# Patient Record
Sex: Female | Born: 1950
Health system: Southern US, Community
[De-identification: ages and names within clinical notes are randomized; demographics above are authoritative.]

## PROBLEM LIST (undated history)

## (undated) DIAGNOSIS — C801 Malignant (primary) neoplasm, unspecified: Secondary | ICD-10-CM

## (undated) DIAGNOSIS — M1990 Unspecified osteoarthritis, unspecified site: Secondary | ICD-10-CM

## (undated) DIAGNOSIS — K649 Unspecified hemorrhoids: Secondary | ICD-10-CM

## (undated) DIAGNOSIS — E079 Disorder of thyroid, unspecified: Secondary | ICD-10-CM

## (undated) DIAGNOSIS — K5790 Diverticulosis of intestine, part unspecified, without perforation or abscess without bleeding: Secondary | ICD-10-CM

## (undated) HISTORY — DX: Diverticulosis of intestine, part unspecified, without perforation or abscess without bleeding: K57.90

## (undated) HISTORY — PX: WRIST SURGERY: SHX841

## (undated) HISTORY — PX: OTHER SURGICAL HISTORY: SHX169

## (undated) HISTORY — DX: Unspecified hemorrhoids: K64.9

## (undated) HISTORY — DX: Disorder of thyroid, unspecified: E07.9

## (undated) HISTORY — DX: Unspecified osteoarthritis, unspecified site: M19.90

---

## 2001-05-28 ENCOUNTER — Other Ambulatory Visit: Admission: RE | Admit: 2001-05-28 | Discharge: 2001-05-28 | Payer: Self-pay | Admitting: Obstetrics and Gynecology

## 2001-07-11 ENCOUNTER — Ambulatory Visit (HOSPITAL_COMMUNITY): Admission: RE | Admit: 2001-07-11 | Discharge: 2001-07-11 | Payer: Self-pay | Admitting: Obstetrics and Gynecology

## 2001-07-11 ENCOUNTER — Encounter: Payer: Self-pay | Admitting: Obstetrics and Gynecology

## 2002-05-27 ENCOUNTER — Encounter: Payer: Self-pay | Admitting: Urology

## 2002-05-27 ENCOUNTER — Ambulatory Visit (HOSPITAL_COMMUNITY): Admission: RE | Admit: 2002-05-27 | Discharge: 2002-05-27 | Payer: Self-pay | Admitting: Urology

## 2002-07-22 ENCOUNTER — Encounter: Payer: Self-pay | Admitting: Obstetrics and Gynecology

## 2002-07-22 ENCOUNTER — Ambulatory Visit (HOSPITAL_COMMUNITY): Admission: RE | Admit: 2002-07-22 | Discharge: 2002-07-22 | Payer: Self-pay | Admitting: Obstetrics and Gynecology

## 2002-08-15 ENCOUNTER — Ambulatory Visit (HOSPITAL_COMMUNITY): Admission: RE | Admit: 2002-08-15 | Discharge: 2002-08-15 | Payer: Self-pay | Admitting: Family Medicine

## 2002-08-15 ENCOUNTER — Encounter: Payer: Self-pay | Admitting: Family Medicine

## 2002-11-17 ENCOUNTER — Ambulatory Visit (HOSPITAL_COMMUNITY): Admission: RE | Admit: 2002-11-17 | Discharge: 2002-11-17 | Payer: Self-pay | Admitting: Internal Medicine

## 2003-09-14 ENCOUNTER — Ambulatory Visit (HOSPITAL_COMMUNITY): Admission: RE | Admit: 2003-09-14 | Discharge: 2003-09-14 | Payer: Self-pay | Admitting: Obstetrics & Gynecology

## 2003-09-14 ENCOUNTER — Encounter: Payer: Self-pay | Admitting: Obstetrics & Gynecology

## 2004-09-27 ENCOUNTER — Ambulatory Visit (HOSPITAL_COMMUNITY): Admission: RE | Admit: 2004-09-27 | Discharge: 2004-09-27 | Payer: Self-pay | Admitting: Obstetrics and Gynecology

## 2005-05-12 ENCOUNTER — Emergency Department (HOSPITAL_COMMUNITY): Admission: EM | Admit: 2005-05-12 | Discharge: 2005-05-12 | Payer: Self-pay | Admitting: Family Medicine

## 2005-06-08 ENCOUNTER — Ambulatory Visit (HOSPITAL_COMMUNITY): Admission: RE | Admit: 2005-06-08 | Discharge: 2005-06-08 | Payer: Self-pay | Admitting: Family Medicine

## 2005-10-09 ENCOUNTER — Ambulatory Visit (HOSPITAL_COMMUNITY): Admission: RE | Admit: 2005-10-09 | Discharge: 2005-10-09 | Payer: Self-pay | Admitting: Obstetrics and Gynecology

## 2006-04-19 ENCOUNTER — Ambulatory Visit: Payer: Self-pay | Admitting: Internal Medicine

## 2006-04-23 ENCOUNTER — Ambulatory Visit (HOSPITAL_COMMUNITY): Admission: RE | Admit: 2006-04-23 | Discharge: 2006-04-23 | Payer: Self-pay | Admitting: Internal Medicine

## 2006-10-11 ENCOUNTER — Ambulatory Visit (HOSPITAL_COMMUNITY): Admission: RE | Admit: 2006-10-11 | Discharge: 2006-10-11 | Payer: Self-pay | Admitting: Obstetrics and Gynecology

## 2007-01-02 ENCOUNTER — Ambulatory Visit (HOSPITAL_COMMUNITY): Admission: RE | Admit: 2007-01-02 | Discharge: 2007-01-02 | Payer: Self-pay | Admitting: Family Medicine

## 2007-02-21 ENCOUNTER — Ambulatory Visit: Payer: Self-pay | Admitting: Orthopedic Surgery

## 2007-04-09 ENCOUNTER — Encounter (HOSPITAL_COMMUNITY): Admission: RE | Admit: 2007-04-09 | Discharge: 2007-05-09 | Payer: Self-pay | Admitting: Orthopedic Surgery

## 2007-04-10 ENCOUNTER — Ambulatory Visit: Payer: Self-pay | Admitting: Orthopedic Surgery

## 2007-10-14 ENCOUNTER — Ambulatory Visit (HOSPITAL_COMMUNITY): Admission: RE | Admit: 2007-10-14 | Discharge: 2007-10-14 | Payer: Self-pay | Admitting: Obstetrics and Gynecology

## 2007-10-16 ENCOUNTER — Other Ambulatory Visit: Admission: RE | Admit: 2007-10-16 | Discharge: 2007-10-16 | Payer: Self-pay | Admitting: Obstetrics and Gynecology

## 2008-05-13 ENCOUNTER — Ambulatory Visit (HOSPITAL_COMMUNITY): Admission: RE | Admit: 2008-05-13 | Discharge: 2008-05-13 | Payer: Self-pay | Admitting: Family Medicine

## 2008-11-03 ENCOUNTER — Ambulatory Visit (HOSPITAL_COMMUNITY): Admission: RE | Admit: 2008-11-03 | Discharge: 2008-11-03 | Payer: Self-pay | Admitting: Obstetrics and Gynecology

## 2008-11-04 ENCOUNTER — Other Ambulatory Visit: Admission: RE | Admit: 2008-11-04 | Discharge: 2008-11-04 | Payer: Self-pay | Admitting: Obstetrics and Gynecology

## 2009-07-13 ENCOUNTER — Ambulatory Visit: Payer: Self-pay | Admitting: Gastroenterology

## 2009-07-13 DIAGNOSIS — M479 Spondylosis, unspecified: Secondary | ICD-10-CM | POA: Insufficient documentation

## 2009-07-13 DIAGNOSIS — K921 Melena: Secondary | ICD-10-CM | POA: Insufficient documentation

## 2009-07-14 ENCOUNTER — Encounter: Payer: Self-pay | Admitting: Gastroenterology

## 2009-07-26 ENCOUNTER — Ambulatory Visit (HOSPITAL_COMMUNITY): Admission: RE | Admit: 2009-07-26 | Discharge: 2009-07-26 | Payer: Self-pay | Admitting: Gastroenterology

## 2009-07-26 ENCOUNTER — Ambulatory Visit: Payer: Self-pay | Admitting: Gastroenterology

## 2009-11-05 ENCOUNTER — Ambulatory Visit (HOSPITAL_COMMUNITY): Admission: RE | Admit: 2009-11-05 | Discharge: 2009-11-05 | Payer: Self-pay | Admitting: Obstetrics and Gynecology

## 2009-11-12 ENCOUNTER — Other Ambulatory Visit: Admission: RE | Admit: 2009-11-12 | Discharge: 2009-11-12 | Payer: Self-pay | Admitting: Obstetrics and Gynecology

## 2010-01-21 ENCOUNTER — Ambulatory Visit (HOSPITAL_COMMUNITY): Admission: RE | Admit: 2010-01-21 | Discharge: 2010-01-21 | Payer: Self-pay | Admitting: Family Medicine

## 2010-05-30 ENCOUNTER — Ambulatory Visit (HOSPITAL_COMMUNITY): Admission: RE | Admit: 2010-05-30 | Discharge: 2010-05-30 | Payer: Self-pay | Admitting: Family Medicine

## 2010-09-19 ENCOUNTER — Ambulatory Visit (HOSPITAL_COMMUNITY): Admission: RE | Admit: 2010-09-19 | Discharge: 2010-09-19 | Payer: Self-pay | Admitting: Family Medicine

## 2011-01-14 ENCOUNTER — Encounter: Payer: Self-pay | Admitting: Obstetrics and Gynecology

## 2011-01-19 ENCOUNTER — Inpatient Hospital Stay (HOSPITAL_COMMUNITY): Admit: 2011-01-19 | Payer: Self-pay

## 2011-01-19 ENCOUNTER — Other Ambulatory Visit (HOSPITAL_COMMUNITY)
Admission: RE | Admit: 2011-01-19 | Discharge: 2011-01-19 | Disposition: A | Discharge status: Home / Self Care | Payer: 59 | Source: Ambulatory Visit | Attending: Obstetrics and Gynecology | Admitting: Obstetrics and Gynecology

## 2011-01-19 ENCOUNTER — Other Ambulatory Visit: Payer: Self-pay | Admitting: Adult Health

## 2011-01-19 DIAGNOSIS — Z01419 Encounter for gynecological examination (general) (routine) without abnormal findings: Secondary | ICD-10-CM | POA: Insufficient documentation

## 2011-01-26 ENCOUNTER — Other Ambulatory Visit: Payer: Self-pay | Admitting: Obstetrics and Gynecology

## 2011-01-26 DIAGNOSIS — Z139 Encounter for screening, unspecified: Secondary | ICD-10-CM

## 2011-01-30 ENCOUNTER — Ambulatory Visit (HOSPITAL_COMMUNITY)
Admission: RE | Admit: 2011-01-30 | Discharge: 2011-01-30 | Disposition: A | Discharge status: Home / Self Care | Payer: 59 | Source: Ambulatory Visit | Attending: Obstetrics and Gynecology | Admitting: Obstetrics and Gynecology

## 2011-01-30 DIAGNOSIS — Z1231 Encounter for screening mammogram for malignant neoplasm of breast: Secondary | ICD-10-CM | POA: Insufficient documentation

## 2011-01-30 DIAGNOSIS — Z139 Encounter for screening, unspecified: Secondary | ICD-10-CM

## 2011-02-09 ENCOUNTER — Other Ambulatory Visit: Payer: Self-pay | Admitting: Adult Health

## 2011-03-05 ENCOUNTER — Emergency Department (HOSPITAL_COMMUNITY)
Admission: EM | Admit: 2011-03-05 | Discharge: 2011-03-05 | Disposition: A | Discharge status: Home / Self Care | Payer: 59 | Attending: Emergency Medicine | Admitting: Emergency Medicine

## 2011-03-05 ENCOUNTER — Emergency Department (HOSPITAL_COMMUNITY): Payer: 59

## 2011-03-05 DIAGNOSIS — W208XXA Other cause of strike by thrown, projected or falling object, initial encounter: Secondary | ICD-10-CM | POA: Insufficient documentation

## 2011-03-05 DIAGNOSIS — S5290XA Unspecified fracture of unspecified forearm, initial encounter for closed fracture: Secondary | ICD-10-CM | POA: Insufficient documentation

## 2011-03-06 ENCOUNTER — Encounter: Payer: Self-pay | Admitting: Orthopedic Surgery

## 2011-03-06 ENCOUNTER — Ambulatory Visit (INDEPENDENT_AMBULATORY_CARE_PROVIDER_SITE_OTHER): Payer: 59 | Admitting: Orthopedic Surgery

## 2011-03-06 DIAGNOSIS — S52599A Other fractures of lower end of unspecified radius, initial encounter for closed fracture: Secondary | ICD-10-CM

## 2011-03-07 ENCOUNTER — Encounter: Payer: Self-pay | Admitting: Orthopedic Surgery

## 2011-03-14 NOTE — Letter (Signed)
Summary: Out of Work  [facility]  [address]  [place], [place] [address]   Phone: [phone]  Fax: [phone]    March 06, 2011   Employee:  [name]    To Whom It May Concern:   For Medical reasons, please excuse the above named employee from work for the following dates:  Start:   03/06/11  End      05/05/11   Out of work X 8 weeks   Estimated return to work date:   05/08/11   If you need additional information, please feel free to contact our office.         Sincerely,    Terrance Mass, MD

## 2011-03-14 NOTE — Letter (Signed)
Summary: work note faxed to employer  work note faxed to employer   Imported By: Cammie Sickle 03/08/2011 13:40:50  _____________________________________________________________________  External Attachment:    Type:   Image     Comment:   External Document

## 2011-03-14 NOTE — Assessment & Plan Note (Signed)
Summary: [facility] fx left wrist xr there/uhc/bsf                         ...   Vital Signs:  Patient profile:   60 year old female Height:      62 inches Weight:      133 pounds BMI:     24.41 Pulse rate:   78 / minute Resp:     16 per minute  Vitals Entered By: Fuller Canada MD (March 06, 2011 2:54 PM)  Visit Type:  new patient Referring Provider:  [facility] Primary Provider:  Phillips Odor  CC:  left wrist.  History of Present Illness: I saw Theresa Vincent in the office today for an initial visit.  She is a 60 years old woman with the complaint of:  left wrist fracture., and pain  DOI 03/05/11 was unloading a grill and the grill fell onto her arm.  Xrays [facility] 03/05/11 left wrist and forearm.  Meds: Vicodin 5/500mg  from er given number 20, Vitamins, Chromium, Calcium.    Her complaints are as follows.-type pain throbbing, intensity-7/10, timing constant.  Improved with no measures.  Worst with trying to use her arm.  Associated signs and symptoms tingling and swelling  Allergies: 1)  ! Avelox  Past History:  Past Surgical History: HAD HER BLADDER STRETCHED BY DR JAVAID IN THE PAST broken toe, screw in toe  Family History: No FH of Colon Cancer, chronic gi illnesses, liver dz FH of Cancer:  Family History of Diabetes Family History Coronary Heart Disease female < 69 Hx, family, kidney disease NEC  Social History: Patient currently smokes.  1/2 ppd for over 30 years Alcohol Use - rare Seperated Employed at [employer]. 4 cups of caffeine per day high school education  Review of Systems Constitutional:  Denies weight loss, weight gain, fever, chills, and fatigue. Cardiovascular:  Denies chest pain, palpitations, fainting, and murmurs. Respiratory:  Denies short of breath, wheezing, couch, tightness, pain on inspiration, and snoring . Gastrointestinal:  Denies heartburn, nausea, vomiting, diarrhea, constipation, and blood in your stools. Genitourinary:  Denies  frequency, urgency, difficulty urinating, painful urination, flank pain, and bleeding in urine. Neurologic:  Denies numbness, tingling, unsteady gait, dizziness, tremors, and seizure. Musculoskeletal:  Denies joint pain, swelling, instability, stiffness, redness, heat, and muscle pain. Endocrine:  Denies excessive thirst, exessive urination, and heat or cold intolerance. Psychiatric:  Denies nervousness, depression, anxiety, and hallucinations. Skin:  Denies changes in the skin, poor healing, rash, itching, and redness. HEENT:  Denies blurred or double vision, eye pain, redness, and watering. Immunology:  Denies seasonal allergies, sinus problems, and allergic to bee stings. Hemoatologic:  Denies easy bleeding and brusing.  Physical Exam  Skin:  intact without lesions or rashes Cervical Nodes:  no significant adenopathy Psych:  alert and cooperative; normal mood and affect; normal attention span and concentration   Wrist/Hand Exam  General:    Well-developed, well-nourished, in no acute distress; alert and oriented x 3.  small body frame  Vascular:    Radial, ulnar, brachial, and axillary pulses 2+ and symmetric; capillary refill less than 2 seconds; no evidence of ischemia, clubbing, or cyanosis.    Sensory:    Gross sensation intact in the upper extremities.    Motor:    motor exam is deferred she can move her fingers move her hand with her wrist but is painful.  She has normal muscle tone in the upper extremity.  Reflexes:  reflex exam is deferred secondary to pain and fracture  Wrist Exam:    Left:    Inspection:  Abnormal    Palpation:  Abnormal    Stability:  stable    Tenderness:  distal radius    Swelling:  distal radius    Erythema:  no    painful arc of motion of 40  Hand Exam:    Right:    Inspection:  Normal    Palpation:  Normal    Tenderness:  no    Swelling:  no    Erythema:  no    Left:    Inspection:  Normal    Palpation:  Normal     Tenderness:  no    Swelling:  no    Erythema:  no   Impression & Recommendations:  Problem # 1:  FRACTURE, RADIUS, DISTAL (ICD-813.42)  x-rays from the emergency room  Radiographs show a nondisplaced slightly angulated fracture the distal radius with intra-articular extension.  Fracture criteria indicate nonoperative treatment  Patient aware that radiographs will have to be done in 2 weeks to assure that the fracture is stable.  Placed in short arm cast  Orders: New Patient Level III (91478) Distal Radius Fx (25600)  Medications Added to Medication List This Visit: 1)  Ibuprofen 800 Mg Tabs (Ibuprofen) .Marland Kitchen.. 1 by mouth three times a day  Patient Instructions: 1)  Please do not get the cast wet. It will casue a severe skin reaction. If you do get it wet, dry it with a hairdryer on a low setting and call the office. [the cast will need to be changed] 2)  OOW 8 weeks  3)  xrays in 2 weeks on the 26th in cast 4)  you have a fractured wrist: use ice and elevate the wrist x next 3-5 days and any time it looks or feels swollen  Prescriptions: IBUPROFEN 800 MG TABS (IBUPROFEN) 1 by mouth three times a day  #90 x 2   Entered and Authorized by:   Fuller Canada MD   Signed by:   Fuller Canada MD on 03/06/2011   Method used:   Faxed to ...       [facility]  [address] [phone]* (retail)       [address]       [place], [place]  [address]       Ph: [phone]       Fax: [phone]   RxID:   [id number]    Orders Added: 1)  New Patient Level III [25956] 2)  Distal Radius Fx [25600]

## 2011-03-15 ENCOUNTER — Encounter: Payer: Self-pay | Admitting: Orthopedic Surgery

## 2011-03-20 ENCOUNTER — Ambulatory Visit: Payer: 59 | Admitting: Orthopedic Surgery

## 2011-03-21 ENCOUNTER — Ambulatory Visit (INDEPENDENT_AMBULATORY_CARE_PROVIDER_SITE_OTHER): Payer: 59 | Admitting: Orthopedic Surgery

## 2011-03-21 DIAGNOSIS — S52599A Other fractures of lower end of unspecified radius, initial encounter for closed fracture: Secondary | ICD-10-CM

## 2011-03-21 NOTE — Discharge Summary (Signed)
Separate x-ray report.  AP, lateral, and oblique x-rays of the LEFT distal radius in cast.  Cast Hides some of the bony detail.  A 2nd oblique x-ray was taken to get a better look at the distal radius.  The radial inclination angle is acceptable. There is minimal shortening. There is neutral volar tilt. The alignment overall is unchanged from x-ray taken on March 11 at the hospital.

## 2011-03-21 NOTE — Progress Notes (Signed)
Followup visit. Date of injury 03/05/2011  Diagnosis distal radius fracture, LEFT.  Treatment short-arm cast.  This is a 2 week 5 days. X-ray in cast.  No major complaints. Minimal over-the-counter medication needed.  X-ray shows stable alignment of the fracture with slight shortening on the ulnar side. Good radial inclination, and neutral volar tilt.  X-ray out of plaster in 2 weeks and then recast.

## 2011-03-23 NOTE — Letter (Signed)
Summary: History form  History form   Imported By: Cammie Sickle 03/13/2011 20:19:33  _____________________________________________________________________  External Attachment:    Type:   Image     Comment:   External Document

## 2011-04-04 ENCOUNTER — Encounter: Payer: Self-pay | Admitting: Orthopedic Surgery

## 2011-04-04 ENCOUNTER — Ambulatory Visit (INDEPENDENT_AMBULATORY_CARE_PROVIDER_SITE_OTHER): Payer: 59 | Admitting: Orthopedic Surgery

## 2011-04-04 DIAGNOSIS — S52599A Other fractures of lower end of unspecified radius, initial encounter for closed fracture: Secondary | ICD-10-CM

## 2011-04-04 NOTE — Patient Instructions (Signed)
Wear splint 4 weeks

## 2011-04-04 NOTE — Discharge Summary (Signed)
Separate identifiable. X-ray report AP, lateral, and oblique of the LEFT wrist.  Reason for x-ray. X-ray followup of her fracture, distal radius.  Previous films are included for comparison.  There is a fracture of the distal radius, which shows progression toward healing. There is slight loss of radial inclination, and dorsal tilt is noted as well.  It is not thought to be excessive.  Impression healing distal radius fracture with slight loss of radial inclination, length and loss of some of the volar tilt.

## 2011-04-04 NOTE — Progress Notes (Signed)
4 weeks and 2 days s/p wrist fracture   LEFT wrist will be x-rayed today.  Clinical exam shows tenderness over the wrist joint, but the metacarpophalangeal joint shows good flexion. Fracture site still tender.  X-rays show slight loss of length and radial inclination overall alignment acceptable.  Splint for 4 weeks rex-ray.

## 2011-05-02 ENCOUNTER — Other Ambulatory Visit: Payer: Self-pay | Admitting: Orthopedic Surgery

## 2011-05-02 ENCOUNTER — Ambulatory Visit (INDEPENDENT_AMBULATORY_CARE_PROVIDER_SITE_OTHER): Payer: 59 | Admitting: Orthopedic Surgery

## 2011-05-02 ENCOUNTER — Encounter: Payer: Self-pay | Admitting: Orthopedic Surgery

## 2011-05-02 DIAGNOSIS — S62109A Fracture of unspecified carpal bone, unspecified wrist, initial encounter for closed fracture: Secondary | ICD-10-CM

## 2011-05-02 NOTE — Progress Notes (Signed)
8 weeks after fracture, distal radius initially, treated with cast and then followed with brace.  Still has some limitations in pronation, supination, especially supination. No tenderness no swelling of the wrist. No tenderness at the fracture site x-ray taken today shows fracture healing. Slight dorsal tilt, slight shortening.  Patient advised to have osteoporosis bone density test already on calcium with D.  I will call her with the results of her bone density

## 2011-05-02 NOTE — Discharge Summary (Signed)
Separate x-ray report AP, lateral, oblique, LEFT distal radius fracture. Previous films are noted for comparison.  The distal radius fracture is healed. The length is mainly maintained with about 2 mm of shortening. Slight dorsal tilt.  Overall alignment acceptable.  Impression healed distal radius fracture with acceptable alignment

## 2011-05-04 ENCOUNTER — Ambulatory Visit (HOSPITAL_COMMUNITY)
Admission: RE | Admit: 2011-05-04 | Discharge: 2011-05-04 | Disposition: A | Discharge status: Home / Self Care | Payer: 59 | Source: Ambulatory Visit | Attending: Orthopedic Surgery | Admitting: Orthopedic Surgery

## 2011-05-04 DIAGNOSIS — M818 Other osteoporosis without current pathological fracture: Secondary | ICD-10-CM | POA: Insufficient documentation

## 2011-05-04 DIAGNOSIS — S62109A Fracture of unspecified carpal bone, unspecified wrist, initial encounter for closed fracture: Secondary | ICD-10-CM

## 2011-05-09 NOTE — Op Note (Signed)
Theresa Vincent, Theresa Vincent                ACCOUNT NO.:  [id number]   MEDICAL RECORD NO.:  [id number]          PATIENT TYPE:  AMB   LOCATION:  DAY                           FACILITY:  [facility]   PHYSICIAN:  Kassie Mends, M.D.      DATE OF BIRTH:  [birth year]   DATE OF PROCEDURE:  07/26/2009  DATE OF DISCHARGE:                                PROCEDURE NOTE   REFERRING PHYSICIAN:  Corrie Mckusick, MD   PROCEDURE:  Colonoscopy.   INDICATION FOR EXAM:  Theresa Vincent is a 60 year old female whose last  colonoscopy is 2003.  Colonoscopy revealed a few sigmoid colon  diverticula and hemorrhoids.  She also has intermittent left-sided  abdominal pain, which hyoscyamine improves and rectal bleeding.  She  denies any change in her bowel habits associated with the abdominal  pain.   FINDINGS:  1. Unable to appreciate any sigmoid colon diverticula.  No polyps,      masses, inflammatory changes or AVMs seen.  2. Single cecal diverticulum.  3. Small internal hemorrhoids.  Otherwise normal retroflexed view of      the rectum.   DIAGNOSIS:  Rectal bleeding likely 2o to hemorrhoids   RECOMMENDATIONS:  1. The patient may use Anusol-HC suppositories 1 per rectum twice      daily for 10 days for intermittent rectal bleeding or rectal      discomfort.  2. Screening colonoscopy in 10 years.  3. No aspirin for 7 days.  No anticoagulation for 7 days.  4. She should follow a high-fiber diet.  She is given a handout on      high-fiber diet and hemorrhoids.   MEDICATIONS:  1. Demerol 75 mg IV.  2. Versed 4 mg IV.   PROCEDURE TECHNIQUE:  Physical exam was performed.  Informed consent was  obtained from the patient after explaining the benefits, risks and  alternatives to the procedure.  The patient was connected to the monitor  and placed in left lateral position.  Continuous oxygen was provided by  nasal cannula and IV medicine administered through an indwelling  cannula.  After administration of sedation and  rectal exam, the  patient's rectum was intubated and the  scope was advanced under direct visualization to the cecum.  The scope  was removed slowly by carefully examining the color, texture, anatomy  and integrity of the mucosa on the way out.  The patient was recovered  in endoscopy and discharged home in satisfactory condition.      Kassie Mends, M.D.  Electronically Signed     SM/MEDQ  D:  07/26/2009  T:  07/27/2009  Job:  [id number]   cc:   Corrie Mckusick, M.D.  Fax: [phone]

## 2011-05-12 NOTE — Op Note (Signed)
NAME:  [name], [name]                          ACCOUNT NO.:  [id number]   MEDICAL RECORD NO.:  [id number]                   PATIENT TYPE:  AMB   LOCATION:  DAY                                  FACILITY:  [facility]   PHYSICIAN:  Lionel December, M.D.                 DATE OF BIRTH:  [birth year]   DATE OF PROCEDURE:  11/17/2002  DATE OF DISCHARGE:                                 OPERATIVE REPORT   PROCEDURE:  Total colonoscopy.   INDICATIONS FOR PROCEDURE:  Theresa Vincent is a 60 year old Caucasian female who was  recently treated for diverticulitis but she still has some intermittent pain  in the LLQ. She is undergoing colonoscopy both for diagnostic and screening  purposes. The procedure risks were reviewed with the patient and informed  consent was obtained.   PREOP MEDICATIONS:  Demerol 25 mg IV, Versed 4 mg IV in divided dose.  Atropine 0.3 mg IV.   INSTRUMENT:  Olympus video system.   FINDINGS:  Procedure performed in endoscopy suite. The patient's vital signs  and O2 saturations were monitored during the procedure. She was given a  small dose of Atropine as above for asymptomatic bradycardia. The patient  was placed in the left lateral decubitus position, rectal examination  performed. No abnormalities noted on external or digital exam. The scope was  placed in the rectum and advanced under direct vision in the sigmoid colon  and beyond. Preparation excellent. The scope was passed to the cecum which  was identified by the ileocecal valve and appendiceal orifice. Pictures  taken for the record and part of her database. As the scope was withdrawn,  the colonic mucosa was carefully examined. There were a few tiny diverticula  at the sigmoid colon. There were also focal areas of erythema at the distal  sigmoid colon felt to be nonspecific. There were no erosions or ulceration.  The rectal mucosa was normal. The scope was retroflexed to examine the  anorectal junction and small hemorrhoids were  noted below the dentate line  as well as single papilla. The endoscope was straightened and withdrawn. The  patient tolerated the procedure well.   FINAL DIAGNOSIS:  1. Examination performed to the cecum.  2. A few tiny diverticula in the sigmoid colon and small external     hemorrhoids, otherwise, normal exam.    RECOMMENDATIONS:  High fiber diet, Citrucel or equivalent 1 tablespoonful  daily, and Levbid 1/2 tablet every morning. She will be seen in three months  to make sure her GI symptoms have completely resolved.                                                Lionel December, M.D.    NR/MEDQ  D:  11/17/2002  T:  11/17/2002  Job:  [id number]   cc:   Lazaro Arms, M.D.  [address]  [place]  [place] [address]  Fax: [phone]   Corrie Mckusick, M.D.  [address]  [place]  [place] [address]  Fax: [phone]

## 2011-05-12 NOTE — Consult Note (Signed)
Theresa Vincent, Theresa Vincent                            ACCOUNT NO.:  [id number]   MEDICAL RECORD NO.:  [id number]                  PATIENT TYPE:   LOCATION:                                       FACILITY:   PHYSICIAN:  Lionel December, M.D.                 DATE OF BIRTH:  13-Apr-1951   DATE OF CONSULTATION:  10/21/2002  DATE OF DISCHARGE:                                   CONSULTATION   REASON FOR CONSULTATION:  Recurrent diverticulitis, colonoscopy.   HISTORY OF PRESENT ILLNESS:  Theresa Vincent is a 60 year old Caucasian female who  presents today for further evaluation of left lower quadrant abdominal pain  as requested by Dr. Despina Hidden.  She developed pain about 4 weeks ago in her left  lower quadrant region.  The pain gradually worsened up until last week.  She  has had multiple studies. She had an MRI of the left hip which showed very  mild bursitis adjacent to the greater trochanter on the proximal left femur.  There was a tiny area of grade 4 chondromalacia of the anterior/superior  lateral aspect of the left femoral head.  No evidence of avascular necrosis  or significant degenerative change.  She also had a renal ultrasound as  requested by Dr. Jerre Simon.  There were 2 nonobstructing renal calculi in the  right kidney and a possible one in the left.  She tells me that he did a  cystoscopy and stretched her bladder.  She saw Dr. Despina Hidden last week given a  history of a left ovarian cyst.  Pelvic examination was normal.  She was  Hemoccult negative on rectal examination.  She was started on Cipro 500 mg  b.i.d. and Flagyl 500 mg b.i.d. for 14 days.  The patient presents today  stating that her left lower quadrant abdominal pain is slowly resolving.  It  is much less intense than last week.  She has completed about 5 days of  antibiotic therapy.  She denies any history of change in her bowels with  this pain.  She typically has a bowel movement daily.  Since starting  antibiotics she has had a couple of  extra stools daily.  She denies any  melena or rectal bleeding.  She has nausea associated with this abdominal  pain.  No heartburn.  No weight loss.  She has some dysuria, but urinalysis  was normal.   CURRENT MEDICATIONS:  1. _______________ q.d.  2. Cipro 500 mg b.i.d. for 14 days.  3. Flagyl 500 mg b.i.d. for 14 days.  4. Multivitamin every day.  5. Calcium 600 mg every day.  6. Glucosamine 1500 mg every day.   ALLERGIES:  No known drug allergies.   PAST MEDICAL HISTORY:  She has arthritis in her back.  Recent MRI of the  left hip revealed mild bursitis. She is on hormone replacement therapy.  Dr.  Frann Rider recently performed a  cystoscopy and stretched her bladder.   FAMILY HISTORY:  Father has had a history of peptic ulcer disease.  No  family history of colorectal cancer.   SOCIAL HISTORY:  She is married for 21 years.  Has 1 child.  She is employed  with [employer].  She has been smoking for 25 years.  She currently  smokes 1 pack per day.  Occasionally consumes beer.   REVIEW OF SYSTEMS:  Please see HPI for GI.  GENERAL: Denies any weight loss,  fatigue, weakness.  CARDIOPULMONARY: Denies any chest pain or shortness of  breath.   PHYSICAL EXAMINATION:  VITAL SIGNS:  Weight 129.5, height 5 feet 2 inches.  Temperature 97.4, blood pressure 100/60, pulse 70.  GENERAL:  A very pleasant, well-nourished, well-developed Caucasian female  in no acute distress.  SKIN:  Warm and dry, no jaundice.  HEENT:  Pupils equal round, reacted to light.  Conjunctivae are pink.  Sclerae are nonicteric.  Oropharyngeal mucosa moist and pink, no lesions,  erythema, or exudate.  No lymphadenopathy, thyromegaly or carotid bruits.  CHEST:  Lungs are clear to auscultation.  CARDIAC:  Cardiac exam reveals regular rate and rhythm.  Normal S1, S2, no  murmurs, rubs, or gallops.  ABDOMEN:  Positive bowel sounds, soft nondistended.  She has mild tenderness  in the left lower quadrant region to  deep palpation.  No organomegaly or  masses.  RECTAL:  Examination deferred.  This was done by Dr. Despina Hidden and was Hemoccult  negative.  EXTREMITIES:  No edema.   IMPRESSION:  This is a pleasant, 60 year old lady who has a 4-week history  of left lower quadrant abdominal pain.  I suspect that she has had  diverticulitis. The pain is now slowly resolving on Cipro and Flagyl.  I  doubt findings of the MRI of the hip explain this left lower quadrant  abdominal pain.  She has never had her colon imaged. She will be 51 this  month and I do recommend a screening colonoscopy.   PLAN:  1. She will continue Cipro and Flagyl, complete 14-day course.  2. If her symptoms do not completely resolve or worsen, she will let me     know.  3. Will plan on colonoscopy in about 3-4 weeks from now.   I would like to thank Dr. Despina Hidden for allowing [place] to take part in the care of  this patient.     Tana Coast, P.A.                        Lionel December, M.D.    LL/MEDQ  D:  10/21/2002  T:  10/22/2002  Job:  [id number]

## 2011-05-16 ENCOUNTER — Encounter: Payer: Self-pay | Admitting: Orthopedic Surgery

## 2011-05-17 ENCOUNTER — Telehealth: Payer: Self-pay | Admitting: Orthopedic Surgery

## 2011-05-17 NOTE — Telephone Encounter (Signed)
Patient her results of her DEXA scan which shows she has osteoporosis with the T score of 2.8  Recommended calcium with vitamin D   Caltrate D  2 tablets a day  Discuss bone density medication with her physician

## 2012-01-22 ENCOUNTER — Other Ambulatory Visit: Payer: Self-pay | Admitting: Obstetrics and Gynecology

## 2012-01-22 DIAGNOSIS — Z139 Encounter for screening, unspecified: Secondary | ICD-10-CM

## 2012-01-30 ENCOUNTER — Other Ambulatory Visit (HOSPITAL_COMMUNITY)
Admission: RE | Admit: 2012-01-30 | Discharge: 2012-01-30 | Disposition: A | Discharge status: Home / Self Care | Payer: 59 | Source: Ambulatory Visit | Attending: Obstetrics and Gynecology | Admitting: Obstetrics and Gynecology

## 2012-01-30 ENCOUNTER — Other Ambulatory Visit: Payer: Self-pay | Admitting: Adult Health

## 2012-01-30 DIAGNOSIS — Z01419 Encounter for gynecological examination (general) (routine) without abnormal findings: Secondary | ICD-10-CM | POA: Insufficient documentation

## 2012-02-08 ENCOUNTER — Ambulatory Visit (HOSPITAL_COMMUNITY)
Admission: RE | Admit: 2012-02-08 | Discharge: 2012-02-08 | Disposition: A | Discharge status: Home / Self Care | Payer: 59 | Source: Ambulatory Visit | Attending: Obstetrics and Gynecology | Admitting: Obstetrics and Gynecology

## 2012-02-08 DIAGNOSIS — Z139 Encounter for screening, unspecified: Secondary | ICD-10-CM

## 2012-02-08 DIAGNOSIS — Z1231 Encounter for screening mammogram for malignant neoplasm of breast: Secondary | ICD-10-CM | POA: Insufficient documentation

## 2012-02-14 ENCOUNTER — Telehealth: Payer: Self-pay

## 2012-02-14 NOTE — Telephone Encounter (Signed)
Pt's last colonoscopy was by Dr. Darrick Penna on 07/26/2009 and was recommended next screening in 10 years. Will check and see if pt is having any problems. LMOM for a return call.

## 2012-02-15 NOTE — Telephone Encounter (Signed)
LMOM to call. Mailing a letter to call also.  

## 2012-02-15 NOTE — Telephone Encounter (Signed)
Copy of letter faxed to Dr. Sherwood Gambler, also.

## 2012-03-06 ENCOUNTER — Ambulatory Visit: Payer: 59 | Admitting: Orthopedic Surgery

## 2012-12-04 ENCOUNTER — Encounter (HOSPITAL_COMMUNITY): Payer: Self-pay | Admitting: Emergency Medicine

## 2012-12-04 ENCOUNTER — Emergency Department (HOSPITAL_COMMUNITY)
Admission: EM | Admit: 2012-12-04 | Discharge: 2012-12-04 | Disposition: A | Discharge status: Home / Self Care | Payer: 59 | Attending: Emergency Medicine | Admitting: Emergency Medicine

## 2012-12-04 ENCOUNTER — Emergency Department (HOSPITAL_COMMUNITY): Payer: 59

## 2012-12-04 DIAGNOSIS — F17200 Nicotine dependence, unspecified, uncomplicated: Secondary | ICD-10-CM | POA: Insufficient documentation

## 2012-12-04 DIAGNOSIS — Y9389 Activity, other specified: Secondary | ICD-10-CM | POA: Insufficient documentation

## 2012-12-04 DIAGNOSIS — S96919A Strain of unspecified muscle and tendon at ankle and foot level, unspecified foot, initial encounter: Secondary | ICD-10-CM

## 2012-12-04 DIAGNOSIS — Z8719 Personal history of other diseases of the digestive system: Secondary | ICD-10-CM | POA: Insufficient documentation

## 2012-12-04 DIAGNOSIS — W010XXA Fall on same level from slipping, tripping and stumbling without subsequent striking against object, initial encounter: Secondary | ICD-10-CM | POA: Insufficient documentation

## 2012-12-04 DIAGNOSIS — Y929 Unspecified place or not applicable: Secondary | ICD-10-CM | POA: Insufficient documentation

## 2012-12-04 DIAGNOSIS — Z8739 Personal history of other diseases of the musculoskeletal system and connective tissue: Secondary | ICD-10-CM | POA: Insufficient documentation

## 2012-12-04 DIAGNOSIS — Z79899 Other long term (current) drug therapy: Secondary | ICD-10-CM | POA: Insufficient documentation

## 2012-12-04 DIAGNOSIS — S93409A Sprain of unspecified ligament of unspecified ankle, initial encounter: Secondary | ICD-10-CM | POA: Insufficient documentation

## 2012-12-04 DIAGNOSIS — Z791 Long term (current) use of non-steroidal anti-inflammatories (NSAID): Secondary | ICD-10-CM | POA: Insufficient documentation

## 2012-12-04 MED ORDER — HYDROCODONE-ACETAMINOPHEN 5-325 MG PO TABS: 1.0000 | ORAL_TABLET | ORAL | Status: DC | PRN

## 2012-12-04 NOTE — ED Notes (Signed)
Patient is driving 

## 2012-12-04 NOTE — ED Notes (Signed)
Pt c/o pain/swelling to right ankle after tripping and falling this am.

## 2012-12-04 NOTE — ED Notes (Signed)
Patient already has crutches at home

## 2012-12-07 NOTE — ED Provider Notes (Signed)
History     CSN: [id number]  Arrival date & time 12/04/12  1224   First MD Initiated Contact with Patient 12/04/12 1342      Chief Complaint  Patient presents with  . Ankle Pain    (Consider location/radiation/quality/duration/timing/severity/associated sxs/prior treatment) HPI Comments: Theresa Vincent presents with right anke pain and swelling since tripping and twisting her foot this am.  She has been able to weight bear,  But with discomfort.  Pain is constant,  Worse with movement and palpation. She denies radiation of pain and denies numbness in her foot or toes.  She has used ice and elevation prior to arrival. She denies other injury.  The history is provided by the patient.    Past Medical History  Diagnosis Date  . Diverticulosis   . Arthritis     in back   . Diverticula of colon   . Hemorrhoid     Past Surgical History  Procedure Date  . Pt had her bladder stretched in the past     Dr. Jerre Simon   . Broken toe screw in toe     Family History  Problem Relation Age of Onset  . Diabetes      family history   . Heart defect      family history   . Kidney disease      History  Substance Use Topics  . Smoking status: Current Every Day Smoker    Types: Cigarettes  . Smokeless tobacco: Not on file  . Alcohol Use: Not on file     Comment: rarely     OB History    Grav Para Term Preterm Abortions TAB SAB Ect Mult Living                  Review of Systems  Musculoskeletal: Positive for joint swelling and arthralgias.  Skin: Negative for wound.  Neurological: Negative for weakness and numbness.    Allergies  Avelox and Moxifloxacin  Home Medications   Current Outpatient Rx  Name  Route  Sig  Dispense  Refill  . CEFUROXIME AXETIL 500 MG PO TABS   Oral   Take 500 mg by mouth 2 (two) times daily.           Marland Kitchen VITAMIN B-12 PO   Oral   Take by mouth daily.           Marland Kitchen FIBER PO CAPS   Oral   Take by mouth daily.           Marland Kitchen FISH OIL  OIL   Does not apply   by Does not apply route.           Marland Kitchen HYDROCODONE-ACETAMINOPHEN 5-325 MG PO TABS   Oral   Take 1 tablet by mouth every 4 (four) hours as needed for pain.   15 tablet   0   . HYOSCYAMINE SULFATE CR PO   Oral   Take by mouth as needed.           . IBUPROFEN 800 MG PO TABS   Oral   Take 800 mg by mouth 3 (three) times daily.           . MULTIVITAMINS PO TABS   Oral   Take 1 tablet by mouth daily.             BP 153/54  Pulse 78  Temp 98.5 F (36.9 C)  Resp 18  Ht 5\' 2"  (1.575 m)  Wt 140  lb (63.504 kg)  BMI 25.61 kg/m2  SpO2 97%  Physical Exam  Nursing note and vitals reviewed. Constitutional: She appears well-developed and well-nourished.  HENT:  Head: Normocephalic.  Cardiovascular: Normal rate and intact distal pulses.  Exam reveals no decreased pulses.   Pulses:      Dorsalis pedis pulses are 2+ on the right side, and 2+ on the left side.       Posterior tibial pulses are 2+ on the right side, and 2+ on the left side.  Musculoskeletal: She exhibits edema and tenderness.       Right ankle: She exhibits decreased range of motion and swelling. She exhibits no ecchymosis and normal pulse. tenderness. Lateral malleolus tenderness found. No head of 5th metatarsal and no proximal fibula tenderness found. Achilles tendon normal.  Neurological: She is alert. No sensory deficit.  Skin: Skin is warm, dry and intact.    ED Course  Procedures (including critical care time)  Labs Reviewed - No data to display No results found.   1. Ankle sprain and strain       ASO and crutches provided.  Cap refill normal after ASO applied.  RICE, referral to ortho if pain symptoms and swelling are not better over the next 5 days.  Hydrocodone prescribed.   MDM          Burgess Amor, PA 12/07/12 1048

## 2012-12-07 NOTE — ED Provider Notes (Signed)
Medical screening examination/treatment/procedure(s) were performed by non-physician practitioner and as supervising physician I was immediately available for consultation/collaboration.   Amarien Carne M Beuna Bolding, DO 12/07/12 1310 

## 2013-02-27 ENCOUNTER — Other Ambulatory Visit (HOSPITAL_COMMUNITY)
Admission: RE | Admit: 2013-02-27 | Discharge: 2013-02-27 | Disposition: A | Discharge status: Home / Self Care | Payer: 59 | Source: Ambulatory Visit | Attending: Obstetrics and Gynecology | Admitting: Obstetrics and Gynecology

## 2013-02-27 ENCOUNTER — Other Ambulatory Visit: Payer: Self-pay | Admitting: Adult Health

## 2013-02-27 DIAGNOSIS — Z01419 Encounter for gynecological examination (general) (routine) without abnormal findings: Secondary | ICD-10-CM | POA: Insufficient documentation

## 2013-03-18 ENCOUNTER — Other Ambulatory Visit: Payer: Self-pay | Admitting: Adult Health

## 2013-03-18 DIAGNOSIS — Z139 Encounter for screening, unspecified: Secondary | ICD-10-CM

## 2013-03-20 ENCOUNTER — Ambulatory Visit (HOSPITAL_COMMUNITY)
Admission: RE | Admit: 2013-03-20 | Discharge: 2013-03-20 | Disposition: A | Discharge status: Home / Self Care | Payer: 59 | Source: Ambulatory Visit | Attending: Adult Health | Admitting: Adult Health

## 2013-03-20 DIAGNOSIS — Z139 Encounter for screening, unspecified: Secondary | ICD-10-CM

## 2013-03-20 DIAGNOSIS — Z1231 Encounter for screening mammogram for malignant neoplasm of breast: Secondary | ICD-10-CM | POA: Insufficient documentation

## 2013-03-26 ENCOUNTER — Ambulatory Visit (HOSPITAL_COMMUNITY)
Admission: RE | Admit: 2013-03-26 | Discharge: 2013-03-26 | Disposition: A | Discharge status: Home / Self Care | Payer: 59 | Source: Ambulatory Visit | Attending: Family Medicine | Admitting: Family Medicine

## 2013-03-26 ENCOUNTER — Other Ambulatory Visit (HOSPITAL_COMMUNITY): Payer: Self-pay | Admitting: Family Medicine

## 2013-03-26 DIAGNOSIS — F17200 Nicotine dependence, unspecified, uncomplicated: Secondary | ICD-10-CM | POA: Insufficient documentation

## 2013-03-26 DIAGNOSIS — J984 Other disorders of lung: Secondary | ICD-10-CM | POA: Insufficient documentation

## 2013-03-26 DIAGNOSIS — R05 Cough: Secondary | ICD-10-CM | POA: Insufficient documentation

## 2013-03-26 DIAGNOSIS — R059 Cough, unspecified: Secondary | ICD-10-CM | POA: Insufficient documentation

## 2013-03-26 DIAGNOSIS — M810 Age-related osteoporosis without current pathological fracture: Secondary | ICD-10-CM

## 2013-04-09 ENCOUNTER — Other Ambulatory Visit (HOSPITAL_COMMUNITY): Payer: Self-pay | Admitting: "Endocrinology

## 2013-04-09 DIAGNOSIS — R7989 Other specified abnormal findings of blood chemistry: Secondary | ICD-10-CM

## 2013-04-16 ENCOUNTER — Encounter (HOSPITAL_COMMUNITY): Payer: Self-pay

## 2013-04-16 ENCOUNTER — Encounter (HOSPITAL_COMMUNITY)
Admission: RE | Admit: 2013-04-16 | Discharge: 2013-04-16 | Disposition: A | Discharge status: Home / Self Care | Payer: 59 | Source: Ambulatory Visit | Attending: "Endocrinology | Admitting: "Endocrinology

## 2013-04-16 DIAGNOSIS — R946 Abnormal results of thyroid function studies: Secondary | ICD-10-CM | POA: Insufficient documentation

## 2013-04-16 DIAGNOSIS — R7989 Other specified abnormal findings of blood chemistry: Secondary | ICD-10-CM

## 2013-04-16 MED ORDER — SODIUM IODIDE I 131 CAPSULE
16.0000 | Freq: Once | INTRAVENOUS | Status: AC | PRN
  Administered 2013-04-16: 16 via ORAL

## 2013-04-17 ENCOUNTER — Encounter (HOSPITAL_COMMUNITY)
Admission: RE | Admit: 2013-04-17 | Discharge: 2013-04-17 | Disposition: A | Discharge status: Home / Self Care | Payer: 59 | Source: Ambulatory Visit | Attending: "Endocrinology | Admitting: "Endocrinology

## 2013-04-17 MED ORDER — SODIUM PERTECHNETATE TC 99M INJECTION
10.0000 | Freq: Once | INTRAVENOUS | Status: AC | PRN
  Administered 2013-04-17: 10 via INTRAVENOUS

## 2013-04-18 ENCOUNTER — Other Ambulatory Visit (HOSPITAL_COMMUNITY): Payer: Self-pay | Admitting: "Endocrinology

## 2013-04-18 DIAGNOSIS — E0590 Thyrotoxicosis, unspecified without thyrotoxic crisis or storm: Secondary | ICD-10-CM

## 2013-04-25 ENCOUNTER — Encounter (HOSPITAL_COMMUNITY)
Admission: RE | Admit: 2013-04-25 | Discharge: 2013-04-25 | Disposition: A | Discharge status: Home / Self Care | Payer: 59 | Source: Ambulatory Visit | Attending: "Endocrinology | Admitting: "Endocrinology

## 2013-04-25 ENCOUNTER — Encounter (HOSPITAL_COMMUNITY): Payer: Self-pay

## 2013-04-25 DIAGNOSIS — E0590 Thyrotoxicosis, unspecified without thyrotoxic crisis or storm: Secondary | ICD-10-CM | POA: Insufficient documentation

## 2013-04-25 MED ORDER — SODIUM IODIDE I 131 CAPSULE
17.0000 | Freq: Once | INTRAVENOUS | Status: AC | PRN
  Administered 2013-04-25: 17 via ORAL

## 2014-03-04 ENCOUNTER — Ambulatory Visit (INDEPENDENT_AMBULATORY_CARE_PROVIDER_SITE_OTHER): Payer: 59 | Admitting: Adult Health

## 2014-03-04 ENCOUNTER — Encounter: Payer: Self-pay | Admitting: Adult Health

## 2014-03-04 ENCOUNTER — Other Ambulatory Visit (HOSPITAL_COMMUNITY)
Admission: RE | Admit: 2014-03-04 | Discharge: 2014-03-04 | Disposition: A | Discharge status: Home / Self Care | Payer: 59 | Source: Ambulatory Visit | Attending: Adult Health | Admitting: Adult Health

## 2014-03-04 VITALS — BP 130/64 | HR 70 | Ht 62.0 in | Wt 142.0 lb

## 2014-03-04 DIAGNOSIS — Z01419 Encounter for gynecological examination (general) (routine) without abnormal findings: Secondary | ICD-10-CM

## 2014-03-04 DIAGNOSIS — Z1212 Encounter for screening for malignant neoplasm of rectum: Secondary | ICD-10-CM

## 2014-03-04 DIAGNOSIS — Z1151 Encounter for screening for human papillomavirus (HPV): Secondary | ICD-10-CM | POA: Insufficient documentation

## 2014-03-04 LAB — HEMOCCULT GUIAC POC 1CARD (OFFICE): Fecal Occult Blood, POC: NEGATIVE

## 2014-03-04 NOTE — Progress Notes (Signed)
Patient ID: Theresa Vincent, female   DOB: Jun 28, 1951, 63 y.o.   MRN: [id number] History of Present Illness: Emilija is a 63 year old white female in for a pap and physical.No complaints. Was hyperthyroid and had thyroid frozen now on synthroid, sees Dr Dorris Fetch.  Current Medications, Allergies, Past Medical History, Past Surgical History, Family History and Social History were reviewed in [employer] record.     Review of Systems: Patient denies any headaches, blurred vision, shortness of breath, chest pain, abdominal pain, problems with bowel movements, urination, or intercourse, not having sex.No joint pain or mood swings.    Physical Exam:BP 130/64  Pulse 70  Ht 5\' 2"  (1.575 m)  Wt 142 lb (64.411 kg)  BMI 25.97 kg/m2 General:  Well developed, well nourished, no acute distress Skin:  Warm and dry Neck:  Midline trachea, normal thyroid Lungs; Clear to auscultation bilaterally Breast:  No dominant palpable mass, retraction, or nipple discharge Cardiovascular: Regular rate and rhythm Abdomen:  Soft, non tender, no hepatosplenomegaly Pelvic:  External genitalia is normal in appearance.  The vagina is normal in appearance. The cervix is smooth, pap with HPV performed.Marland Kitchen  Uterus is felt to be normal size, shape, and contour.  No                adnexal masses or tenderness noted. Rectal: Good sphincter tone, no polyps, or hemorrhoids felt.  Hemoccult negative. Extremities:  No swelling or varicosities noted Psych:  No mood changes, alert and cooperative, seems happy   Impression: Yearly gyn exam    Plan: Physical in 1 year Mammogram yearly Colonoscopy advised Labs with PCP

## 2014-03-04 NOTE — Patient Instructions (Signed)
Physical in 1 year Mammogram yearly Labs at PCP Colonoscopy advised

## 2014-06-05 ENCOUNTER — Emergency Department (HOSPITAL_COMMUNITY)
Admission: EM | Admit: 2014-06-05 | Discharge: 2014-06-05 | Disposition: A | Discharge status: Home / Self Care | Payer: 59 | Attending: Emergency Medicine | Admitting: Emergency Medicine

## 2014-06-05 ENCOUNTER — Encounter (HOSPITAL_COMMUNITY): Payer: Self-pay | Admitting: Emergency Medicine

## 2014-06-05 ENCOUNTER — Emergency Department (HOSPITAL_COMMUNITY): Payer: 59

## 2014-06-05 DIAGNOSIS — Z8719 Personal history of other diseases of the digestive system: Secondary | ICD-10-CM | POA: Insufficient documentation

## 2014-06-05 DIAGNOSIS — F17200 Nicotine dependence, unspecified, uncomplicated: Secondary | ICD-10-CM | POA: Insufficient documentation

## 2014-06-05 DIAGNOSIS — W208XXA Other cause of strike by thrown, projected or falling object, initial encounter: Secondary | ICD-10-CM | POA: Insufficient documentation

## 2014-06-05 DIAGNOSIS — E079 Disorder of thyroid, unspecified: Secondary | ICD-10-CM | POA: Insufficient documentation

## 2014-06-05 DIAGNOSIS — S92919A Unspecified fracture of unspecified toe(s), initial encounter for closed fracture: Secondary | ICD-10-CM | POA: Insufficient documentation

## 2014-06-05 DIAGNOSIS — Y939 Activity, unspecified: Secondary | ICD-10-CM | POA: Insufficient documentation

## 2014-06-05 DIAGNOSIS — Y929 Unspecified place or not applicable: Secondary | ICD-10-CM | POA: Insufficient documentation

## 2014-06-05 DIAGNOSIS — Z79899 Other long term (current) drug therapy: Secondary | ICD-10-CM | POA: Insufficient documentation

## 2014-06-05 DIAGNOSIS — M129 Arthropathy, unspecified: Secondary | ICD-10-CM | POA: Insufficient documentation

## 2014-06-05 MED ORDER — HYDROCODONE-ACETAMINOPHEN 5-325 MG PO TABS: 2.0000 | ORAL_TABLET | ORAL | Status: DC | PRN

## 2014-06-05 NOTE — ED Provider Notes (Signed)
Medical screening examination/treatment/procedure(s) were performed by non-physician practitioner and as supervising physician I was immediately available for consultation/collaboration.   EKG Interpretation None         Orpah Greek, MD 06/05/14 [phone]

## 2014-06-05 NOTE — ED Provider Notes (Signed)
CSN: [id number]     Arrival date & time 06/05/14  1611 History   None    Chief Complaint  Patient presents with  . Toe Injury     (Consider location/radiation/quality/duration/timing/severity/associated sxs/prior Treatment) Patient is a 63 y.o. female presenting with toe pain. The history is provided by the patient. No language interpreter was used.  Toe Pain The current episode started today. The problem occurs constantly. The problem has been unchanged. Associated symptoms include joint swelling, myalgias and numbness. Nothing aggravates the symptoms. She has tried nothing for the symptoms. The treatment provided no relief.    Past Medical History  Diagnosis Date  . Diverticulosis   . Arthritis     in back   . Diverticula of colon   . Hemorrhoid   . Thyroid disease     was hyperthyroid had frozen and now on meds   Past Surgical History  Procedure Laterality Date  . Pt had her bladder stretched in the past      Dr. Michela Pitcher   . Broken toe screw in toe     Family History  Problem Relation Age of Onset  . Diabetes Mother   . Heart disease Father   . Diabetes Father   . Diabetes Sister   . Diabetes Sister   . Cancer Sister     breast   History  Substance Use Topics  . Smoking status: Current Every Day Smoker -- 1.00 packs/day    Types: Cigarettes  . Smokeless tobacco: Never Used  . Alcohol Use: Yes     Comment: rarely    OB History   Grav Para Term Preterm Abortions TAB SAB Ect Mult Living   1 1        1      Review of Systems  Musculoskeletal: Positive for joint swelling and myalgias.  Neurological: Positive for numbness.  All other systems reviewed and are negative.     Allergies  Avelox and Moxifloxacin  Home Medications   Prior to Admission medications   Medication Sig Start Date End Date Taking? Authorizing Provider  cyanocobalamin 500 MCG tablet Take 500 mcg by mouth every morning.   Yes Historical Provider, MD  ibuprofen (ADVIL,MOTRIN) 200 MG  tablet Take 800 mg by mouth every 6 (six) hours as needed for mild pain or moderate pain.   Yes Historical Provider, MD  levothyroxine (SYNTHROID, LEVOTHROID) 88 MCG tablet Take 88 mcg by mouth every morning.   Yes Historical Provider, MD  Multiple Vitamin (MULTIVITAMIN WITH MINERALS) TABS tablet Take 1 tablet by mouth daily.   Yes Historical Provider, MD   BP 116/71  Pulse 77  Temp(Src) 98 F (36.7 C) (Oral)  Resp 16  Ht 5\' 2"  (1.575 m)  Wt 138 lb (62.596 kg)  BMI 25.23 kg/m2  SpO2 97% Physical Exam  Nursing note and vitals reviewed. Constitutional: She is oriented to person, place, and time. She appears well-developed and well-nourished.  Musculoskeletal: She exhibits tenderness.  Swollen right 4th toe,  Decreased range of motion  Neurological: She is alert and oriented to person, place, and time. She has normal reflexes.  Skin: Skin is warm.  Psychiatric: She has a normal mood and affect.    ED Course  Procedures (including critical care time) Labs Review Labs Reviewed - No data to display  Imaging Review Dg Toe 4th Right  06/05/2014   CLINICAL DATA:  Toe injury  EXAM: RIGHT FOURTH TOE  COMPARISON:  None.  FINDINGS: Mildly displaced fracture of the  fourth proximal phalanx not extending into the PIP joint. No arthropathy  IMPRESSION: Dorsally displaced fracture of the proximal fourth phalanx   Electronically Signed   By: Franchot Gallo M.D.   On: 06/05/2014 16:55     EKG Interpretation None      MDM   Final diagnoses:  Fracture, toe    See dr. Aline Brochure for rechek Post op shoe Buddy tape Hydrocodone for pain    Fransico Meadow, PA-C 06/05/14 1805

## 2014-06-05 NOTE — Discharge Instructions (Signed)
Toe Fracture  Your caregiver has diagnosed you as having a fractured toe. A toe fracture is a break in the bone of a toe. "Buddy taping" is a way of splinting your broken toe, by taping the broken toe to the toe next to it. This "buddy taping" will keep the injured toe from moving beyond normal range of motion. Buddy taping also helps the toe heal in a more normal alignment. It may take 6 to 8 weeks for the toe injury to heal.  HOME CARE INSTRUCTIONS    Leave your toes taped together for as long as directed by your caregiver or until you see a doctor for a follow-up examination. You can change the tape after bathing. Always use a small piece of gauze or cotton between the toes when taping them together. This will help the skin stay dry and prevent infection.   Apply ice to the injury for 15-20 minutes each hour while awake for the first 2 days. Put the ice in a plastic bag and place a towel between the bag of ice and your skin.   After the first 2 days, apply heat to the injured area. Use heat for the next 2 to 3 days. Place a heating pad on the foot or soak the foot in warm water as directed by your caregiver.   Keep your foot elevated as much as possible to lessen swelling.   Wear sturdy, supportive shoes. The shoes should not pinch the toes or fit tightly against the toes.   Your caregiver may prescribe a rigid shoe if your foot is very swollen.   Your may be given crutches if the pain is too great and it hurts too much to walk.   Only take over-the-counter or prescription medicines for pain, discomfort, or fever as directed by your caregiver.   If your caregiver has given you a follow-up appointment, it is very important to keep that appointment. Not keeping the appointment could result in a chronic or permanent injury, pain, and disability. If there is any problem keeping the appointment, you must call back to this facility for assistance.  SEEK MEDICAL CARE IF:    You have increased pain or swelling,  not relieved with medications.   The pain does not get better after 1 week.   Your injured toe is cold when the others are warm.  SEEK IMMEDIATE MEDICAL CARE IF:    The toe becomes cold, numb, or white.   The toe becomes hot (inflamed) and red.  Document Released: 12/08/2000 Document Revised: 03/04/2012 Document Reviewed: 07/27/2008  ExitCare Patient Information 2014 [employer].

## 2014-06-05 NOTE — ED Notes (Signed)
Patient with no complaints at this time. Respirations even and unlabored. Skin warm/dry. Discharge instructions reviewed with patient at this time. Patient given opportunity to voice concerns/ask questions. Patient discharged at this time and left Emergency Department with steady gait.   

## 2014-06-05 NOTE — ED Notes (Signed)
Bottle fell out of refrigerator onto rt 4th toe.  Pain, swelling

## 2014-06-08 ENCOUNTER — Ambulatory Visit (INDEPENDENT_AMBULATORY_CARE_PROVIDER_SITE_OTHER): Payer: 59 | Admitting: Orthopedic Surgery

## 2014-06-08 ENCOUNTER — Encounter: Payer: Self-pay | Admitting: Orthopedic Surgery

## 2014-06-08 VITALS — BP 122/84 | Ht 62.0 in | Wt 141.0 lb

## 2014-06-08 DIAGNOSIS — S92501A Displaced unspecified fracture of right lesser toe(s), initial encounter for closed fracture: Secondary | ICD-10-CM

## 2014-06-08 DIAGNOSIS — S92919A Unspecified fracture of unspecified toe(s), initial encounter for closed fracture: Secondary | ICD-10-CM

## 2014-06-08 NOTE — Patient Instructions (Addendum)
Tape/immobilization x4 weeks then x-rays  OOW X 4 WEEKS

## 2014-06-08 NOTE — Progress Notes (Signed)
Patient ID: Theresa Vincent, female   DOB: August 03, 1951, 63 y.o.   MRN: [id number]  Chief Complaint  Patient presents with  . Toe Pain    [facility] follow up; fracture right fourth toe d/t injury 06/05/14    Proximal phalanx fracture right fourth digit of the foot.  Date injury June 12 mechanism something fell on the foot. Symptoms pain swelling pain is described as throbbing. Timing constant. Intensity 6. X-rays were done. She's on ibuprofen and she has a postop shoe    Past Medical History  Diagnosis Date  . Diverticulosis   . Arthritis     in back   . Diverticula of colon   . Hemorrhoid   . Thyroid disease     was hyperthyroid had frozen and now on meds    Review of systems she does report some sinus problems some problems related to the ear nose and throat system joint pain she has a thyroid disorder which causes him temperature regulation issues she has glasses some visual problems which are mild   General the patient is well-developed and well-nourished grooming and hygiene are normal Oriented x3 Mood and affect normal Inspection of the right fourth digit of the foot is tender mild swelling no deformity painful breast range of motion No muscle atrophy, Skin clean dry and intact  Cardiovascular exam is normal Sensory exam normal  Medical decision-making data reviewed x-ray report and review x-ray. X-ray shows a dorsally displaced mildly self proximal phalanx of the fourth digit of the right foot. Diagnosis new problem closed fracture  Risk treatment of closed fracture without manipulation  Recommend postop shoe;  x-ray in 4 weeks out of work 4 weeks

## 2014-07-13 ENCOUNTER — Ambulatory Visit (INDEPENDENT_AMBULATORY_CARE_PROVIDER_SITE_OTHER): Payer: 59

## 2014-07-13 ENCOUNTER — Encounter: Payer: Self-pay | Admitting: Orthopedic Surgery

## 2014-07-13 ENCOUNTER — Ambulatory Visit (INDEPENDENT_AMBULATORY_CARE_PROVIDER_SITE_OTHER): Payer: Self-pay | Admitting: Orthopedic Surgery

## 2014-07-13 VITALS — BP 107/58 | Ht 62.0 in | Wt 141.0 lb

## 2014-07-13 DIAGNOSIS — S92501D Displaced unspecified fracture of right lesser toe(s), subsequent encounter for fracture with routine healing: Secondary | ICD-10-CM

## 2014-07-13 DIAGNOSIS — S8290XD Unspecified fracture of unspecified lower leg, subsequent encounter for closed fracture with routine healing: Secondary | ICD-10-CM

## 2014-07-13 NOTE — Progress Notes (Signed)
Patient ID: Theresa Vincent, female   DOB: 03/19/1951, 63 y.o.   MRN: [id number]  /cc Chief Complaint  Patient presents with  . Follow-up    1 month recheck on right 4th toe fracture with xray.   Status post fracture right fourth toe proximal phalanx. No complaints of pain. X-rays show fracture healing. Patient released from care return to work today.

## 2014-07-13 NOTE — Patient Instructions (Signed)
Okay to remove shoe Return to work today

## 2014-10-26 ENCOUNTER — Encounter (HOSPITAL_COMMUNITY): Payer: Self-pay | Admitting: Emergency Medicine

## 2015-08-06 ENCOUNTER — Other Ambulatory Visit: Payer: Self-pay | Admitting: Physician Assistant

## 2015-11-01 ENCOUNTER — Ambulatory Visit: Payer: Self-pay | Admitting: "Endocrinology

## 2015-12-31 ENCOUNTER — Other Ambulatory Visit: Payer: Self-pay

## 2015-12-31 MED ORDER — LEVOTHYROXINE SODIUM 75 MCG PO TABS: 75.0000 ug | ORAL_TABLET | Freq: Every day | ORAL | Status: DC

## 2016-01-03 ENCOUNTER — Other Ambulatory Visit: Payer: Self-pay | Admitting: "Endocrinology

## 2016-01-04 LAB — T4, FREE: Free T4: 1.21 ng/dL (ref 0.82–1.77)

## 2016-01-04 LAB — TSH: TSH: 0.056 u[IU]/mL — ABNORMAL LOW (ref 0.450–4.500)

## 2016-01-04 LAB — HGB A1C W/O EAG: HEMOGLOBIN A1C: 5.9 % — AB (ref 4.8–5.6)

## 2016-01-10 ENCOUNTER — Ambulatory Visit: Payer: Self-pay | Admitting: "Endocrinology

## 2016-01-13 ENCOUNTER — Encounter: Payer: Self-pay | Admitting: "Endocrinology

## 2016-01-13 ENCOUNTER — Ambulatory Visit (INDEPENDENT_AMBULATORY_CARE_PROVIDER_SITE_OTHER): Payer: Commercial Managed Care - HMO | Admitting: "Endocrinology

## 2016-01-13 VITALS — BP 118/65 | HR 67 | Ht 62.0 in | Wt 159.0 lb

## 2016-01-13 DIAGNOSIS — R7303 Prediabetes: Secondary | ICD-10-CM

## 2016-01-13 DIAGNOSIS — E032 Hypothyroidism due to medicaments and other exogenous substances: Secondary | ICD-10-CM

## 2016-01-13 DIAGNOSIS — E890 Postprocedural hypothyroidism: Secondary | ICD-10-CM | POA: Insufficient documentation

## 2016-01-13 MED ORDER — LEVOTHYROXINE SODIUM 75 MCG PO TABS: 75.0000 ug | ORAL_TABLET | Freq: Every day | ORAL | Status: DC

## 2016-01-13 NOTE — Patient Instructions (Signed)

## 2016-01-13 NOTE — Progress Notes (Signed)
Subjective:    Patient ID: Theresa Vincent, female    DOB: [birth year], PCP Purvis Kilts, MD   Past Medical History  Diagnosis Date  . Diverticulosis   . Arthritis     in back   . Diverticula of colon   . Hemorrhoid   . Thyroid disease     was hyperthyroid had frozen and now on meds   Past Surgical History  Procedure Laterality Date  . Pt had her bladder stretched in the past      Dr. Michela Pitcher   . Broken toe screw in toe     Social History   Social History  . Marital Status: Legally Separated    Spouse Name: N/A  . Number of Children: N/A  . Years of Education: hs    Occupational History  .  [employer]   Social History Main Topics  . Smoking status: Current Every Day Smoker -- 1.00 packs/day    Types: Cigarettes  . Smokeless tobacco: Never Used  . Alcohol Use: Yes     Comment: rarely   . Drug Use: No  . Sexual Activity: Not Currently   Other Topics Concern  . None   Social History Narrative   Outpatient Encounter Prescriptions as of 01/13/2016  Medication Sig  . buPROPion (WELLBUTRIN XL) 150 MG 24 hr tablet Take 150 mg by mouth daily.  Marland Kitchen levothyroxine (SYNTHROID, LEVOTHROID) 75 MCG tablet Take 1 tablet (75 mcg total) by mouth daily.  Marland Kitchen tiotropium (SPIRIVA) 18 MCG inhalation capsule Place 18 mcg into inhaler and inhale daily.  . [DISCONTINUED] levothyroxine (SYNTHROID, LEVOTHROID) 75 MCG tablet Take 1 tablet (75 mcg total) by mouth daily.  . cyanocobalamin 500 MCG tablet Take 500 mcg by mouth every morning.  Marland Kitchen HYDROcodone-acetaminophen (NORCO/VICODIN) 5-325 MG per tablet Take 2 tablets by mouth every 4 (four) hours as needed for moderate pain.  Marland Kitchen ibuprofen (ADVIL,MOTRIN) 200 MG tablet Take 800 mg by mouth every 6 (six) hours as needed for mild pain or moderate pain.  . Multiple Vitamin (MULTIVITAMIN WITH MINERALS) TABS tablet Take 1 tablet by mouth daily.   No facility-administered encounter medications on file as of 01/13/2016.    ALLERGIES: Allergies  Allergen Reactions  . Avelox [Moxifloxacin Hcl In Nacl] Itching  . Moxifloxacin Itching    REACTION: itching   VACCINATION STATUS: Immunization History  Administered Date(s) Administered  . Influenza-Unspecified 11/04/2013    HPI  Theresa Vincent is  here to f/u for her RAI induced hypothyroidism. She is s/p RAI thyroid ablation on May 2nd 2014. She is on [facility] replacement 75 mcg.  She feels better, has more energy. She continues to regain the weight she lost when she was hyperthyroid   Denies palpitations, tremors, heat intolerance. she still smokes. she denies family hx of thyroid dysfunction. denies hx of goiter.   Review of Systems Constitutional: +weight gain,  - fatigue, no subjective hyperthermia/hypothermia Eyes: no blurry vision, no xerophthalmia ENT: no sore throat, no nodules palpated in throat, no dysphagia/odynophagia, no hoarseness Cardiovascular: no CP/SOB/palpitations/leg swelling Respiratory: no cough/SOB Gastrointestinal: no N/V/D/C Musculoskeletal: no muscle/joint aches Skin: no rashes Neurological: no tremors/numbness/tingling/dizziness Psychiatric: no depression/anxiety  Objective:    BP 118/65 mmHg  Pulse 67  Ht '5\' 2"'$  (1.575 m)  Wt 159 lb (72.122 kg)  BMI 29.07 kg/m2  SpO2 96%  Wt Readings from Last 3 Encounters:  01/13/16 159 lb (72.122 kg)  07/13/14 141 lb (63.957 kg)  06/08/14 141 lb (63.957 kg)  Physical Exam Constitutional: overweight, in NAD Eyes: PERRLA, EOMI, no exophthalmos ENT: moist mucous membranes, no thyromegaly, no cervical lymphadenopathy Cardiovascular: RRR, No MRG Respiratory: CTA B Gastrointestinal: abdomen soft, NT, ND, BS+ Musculoskeletal: no deformities, strength intact in all 4 Skin: moist, warm, no rashes Neurological: no tremor with outstretched hands, DTR normal in all 4  Recent Results (from the past 2160 hour(s))  Hgb A1c w/o eAG     Status: Abnormal   Collection Time: 01/03/16  1:53 PM   Result Value Ref Range   Hgb A1c MFr Bld 5.9 (H) 4.8 - 5.6 %    Comment:          Pre-diabetes: 5.7 - 6.4          Diabetes: >6.4          Glycemic control for adults with diabetes: <7.0   T4, free     Status: None   Collection Time: 01/03/16  1:53 PM  Result Value Ref Range   Free T4 1.21 0.82 - 1.77 ng/dL  TSH     Status: Abnormal   Collection Time: 01/03/16  1:53 PM  Result Value Ref Range   TSH 0.056 (L) 0.450 - 4.500 uIU/mL     Assessment & Plan:   1. Hypothyroidism due to medicaments and other exogenous substances Her TFts show appropriate replacement, TSH is not indicative of overtherapy in her case, hence will be ignored for now. Total T4 is 9.9 ( 4.5-12). I will continue LT4 75 mcg po qam. she may need a higher dose on subsequent visits based on her thyroid function test results.   She is s/p RAI thyroid ablation on Apr 25 2013. Repeat labs in 6 months with office visit to readjust her doses. I have counseled Theresa Vincent regarding the need to take [facility] for life and she agrees. she is advised to quit smoking which could be causing her fatigue via COPD Spo2 95%. I advise her if the LE swelling recurs, she will need dermatology evaluation. 2. Prediabetes: New diagnosis. -Detailed carbohydrate management and exercise regimen is provided to the patient. I discussed the need to prevent diabetes to avoid  Complications. - I advised patient to maintain close follow up with Purvis Kilts, MD for primary care needs. Follow up plan: Return in about 6 months (around 07/12/2016) for underactive thyroid.  Glade Lloyd, MD Phone: [phone]  Fax: [phone]   01/13/2016, 10:20 AM

## 2016-01-17 ENCOUNTER — Other Ambulatory Visit: Payer: Self-pay | Admitting: "Endocrinology

## 2016-03-19 ENCOUNTER — Other Ambulatory Visit: Payer: Self-pay | Admitting: "Endocrinology

## 2016-03-20 HISTORY — PX: EYE SURGERY: SHX253

## 2016-04-04 ENCOUNTER — Other Ambulatory Visit: Payer: Self-pay | Admitting: Obstetrics and Gynecology

## 2016-04-04 DIAGNOSIS — Z1231 Encounter for screening mammogram for malignant neoplasm of breast: Secondary | ICD-10-CM

## 2016-04-06 ENCOUNTER — Ambulatory Visit (HOSPITAL_COMMUNITY)
Admission: RE | Admit: 2016-04-06 | Discharge: 2016-04-06 | Disposition: A | Discharge status: Home / Self Care | Payer: Commercial Managed Care - HMO | Source: Ambulatory Visit | Attending: Obstetrics and Gynecology | Admitting: Obstetrics and Gynecology

## 2016-04-06 DIAGNOSIS — Z1231 Encounter for screening mammogram for malignant neoplasm of breast: Secondary | ICD-10-CM | POA: Diagnosis not present

## 2016-04-13 ENCOUNTER — Ambulatory Visit (INDEPENDENT_AMBULATORY_CARE_PROVIDER_SITE_OTHER): Payer: Commercial Managed Care - HMO | Admitting: Adult Health

## 2016-04-13 ENCOUNTER — Encounter: Payer: Self-pay | Admitting: Adult Health

## 2016-04-13 VITALS — BP 140/70 | HR 70 | Ht 61.5 in | Wt 158.0 lb

## 2016-04-13 DIAGNOSIS — Z1212 Encounter for screening for malignant neoplasm of rectum: Secondary | ICD-10-CM | POA: Diagnosis not present

## 2016-04-13 DIAGNOSIS — Z01419 Encounter for gynecological examination (general) (routine) without abnormal findings: Secondary | ICD-10-CM

## 2016-04-13 LAB — HEMOCCULT GUIAC POC 1CARD (OFFICE): FECAL OCCULT BLD: NEGATIVE

## 2016-04-13 NOTE — Progress Notes (Signed)
Patient ID: Theresa Vincent, female   DOB: August 03, 1951, 65 y.o.   MRN: [id number] History of Present Illness: Theresa Vincent is a 65 year old white female in for a well woman gyn exam, she had a normal pap with negative HPV 3/11/5.She complains about her weight, since thyroid problems, walks on treadmill.She had eye lift 64 month ago. PCP is Dr Hilma Favors and sees Dr Dorris Fetch for thyroid.   Current Medications, Allergies, Past Medical History, Past Surgical History, Family History and Social History were reviewed in [employer] record.     Review of Systems: Patient denies any headaches, hearing loss, fatigue, blurred vision, shortness of breath, chest pain, abdominal pain, problems with bowel movements, urination, or intercourse(not having sex). No joint pain or mood swings.    Physical Exam:BP 140/70 mmHg  Pulse 70  Ht 5' 1.5" (1.562 m)  Wt 158 lb (71.668 kg)  BMI 29.37 kg/m2 General:  Well developed, well nourished, no acute distress Skin:  Warm and dry Neck:  Midline trachea, normal thyroid, good ROM, no lymphadenopathy Lungs; Clear to auscultation bilaterally Breast:  No dominant palpable mass, retraction, or nipple discharge Cardiovascular: Regular rate and rhythm Abdomen:  Soft, non tender, no hepatosplenomegaly Pelvic:  External genitalia is normal in appearance, no lesions.  The vagina is normal in appearance. Urethra has no lesions or masses. The cervix is smooth.  Uterus is felt to be normal size, shape, and contour.  No adnexal masses or tenderness noted.Bladder is non tender, no masses felt. Rectal: Good sphincter tone, no polyps, or hemorrhoids felt.  Hemoccult negative. Extremities/musculoskeletal:  No varicosities noted, no clubbing or cyanosis,has swelling in lower legs, ? Related to thyroid, has seen dermatologist too and biopsy normal Psych:  No mood changes, alert and cooperative,seems happy   Impression: Well woman gyn exam no pap    Plan: Pap and physical  in 1 year Mammogram yearly Labs with Dr Dorris Fetch Colonoscopy advised

## 2016-04-13 NOTE — Patient Instructions (Signed)
Pap and physical in 1 year Mammogram yearly Colonoscopy advised Labs with Dr Dorris Fetch

## 2016-07-06 ENCOUNTER — Other Ambulatory Visit: Payer: Self-pay | Admitting: "Endocrinology

## 2016-07-07 LAB — T4, FREE: FREE T4: 1.47 ng/dL (ref 0.82–1.77)

## 2016-07-07 LAB — TSH

## 2016-07-13 ENCOUNTER — Encounter: Payer: Self-pay | Admitting: "Endocrinology

## 2016-07-13 ENCOUNTER — Ambulatory Visit (INDEPENDENT_AMBULATORY_CARE_PROVIDER_SITE_OTHER): Payer: Commercial Managed Care - HMO | Admitting: "Endocrinology

## 2016-07-13 VITALS — BP 126/72 | HR 64 | Resp 18 | Ht 62.0 in | Wt 147.0 lb

## 2016-07-13 DIAGNOSIS — E890 Postprocedural hypothyroidism: Secondary | ICD-10-CM | POA: Diagnosis not present

## 2016-07-13 DIAGNOSIS — R7303 Prediabetes: Secondary | ICD-10-CM | POA: Diagnosis not present

## 2016-07-13 MED ORDER — LEVOTHYROXINE SODIUM 50 MCG PO TABS: 50.0000 ug | ORAL_TABLET | Freq: Every day | ORAL | Status: DC

## 2016-07-13 NOTE — Progress Notes (Signed)
Subjective:    Patient ID: Theresa Vincent, female    DOB: 01-23-1951, PCP Purvis Kilts, MD   Past Medical History  Diagnosis Date  . Diverticulosis   . Arthritis     in back   . Diverticula of colon   . Hemorrhoid   . Thyroid disease     was hyperthyroid had frozen and now on meds   Past Surgical History  Procedure Laterality Date  . Pt had her bladder stretched in the past      Dr. Michela Pitcher   . Broken toe screw in toe    . Eye surgery  03/20/16  . Radiation thyroid     Social History   Social History  . Marital Status: Legally Separated    Spouse Name: N/A  . Number of Children: N/A  . Years of Education: hs    Occupational History  .  [employer]   Social History Main Topics  . Smoking status: Current Some Day Smoker -- 0.00 packs/day for 30 years    Types: Cigarettes  . Smokeless tobacco: Never Used  . Alcohol Use: Yes     Comment: rarely   . Drug Use: No  . Sexual Activity: Not Currently    Birth Control/ Protection: Post-menopausal   Other Topics Concern  . None   Social History Narrative   Outpatient Encounter Prescriptions as of 07/13/2016  Medication Sig  . latanoprost (XALATAN) 0.005 % ophthalmic solution INSTILL 1 DROP IN BOTH EYES NIGHTLY  . levothyroxine (SYNTHROID, LEVOTHROID) 50 MCG tablet Take 1 tablet (50 mcg total) by mouth daily before breakfast.  . Multiple Vitamin (MULTIVITAMIN WITH MINERALS) TABS tablet Take 1 tablet by mouth daily.  . [DISCONTINUED] levothyroxine (SYNTHROID, LEVOTHROID) 50 MCG tablet Take 1 tablet (50 mcg total) by mouth daily before breakfast.  . [DISCONTINUED] levothyroxine (SYNTHROID, LEVOTHROID) 75 MCG tablet TAKE 1 TABLET BY MOUTH EVERY MORNING   No facility-administered encounter medications on file as of 07/13/2016.   ALLERGIES: Allergies  Allergen Reactions  . Avelox [Moxifloxacin Hcl In Nacl] Itching   VACCINATION STATUS: Immunization History  Administered Date(s) Administered  .  Influenza-Unspecified 11/04/2013    HPI  Theresa Vincent is  here to f/u for her RAI induced hypothyroidism. She is s/p RAI thyroid ablation on May 2nd 2014. She is on [facility] replacement 75 mcg.  She feels better, has more energy. She  lost 11 pounds of weight since last visit  Denies palpitations, tremors, heat intolerance. she still smokes. she denies family hx of thyroid dysfunction. denies hx of goiter.   Review of Systems Constitutional: +weight loss,  - fatigue, no subjective hyperthermia/hypothermia Eyes: no blurry vision, no xerophthalmia ENT: no sore throat, no nodules palpated in throat, no dysphagia/odynophagia, no hoarseness Cardiovascular: no CP/SOB/palpitations/leg swelling Respiratory: no cough/SOB Gastrointestinal: no N/V/D/C Musculoskeletal: no muscle/joint aches Skin: no rashes Neurological: no tremors/numbness/tingling/dizziness Psychiatric: no depression/anxiety  Objective:    BP 126/72 mmHg  Pulse 64  Resp 18  Ht '5\' 2"'$  (1.575 m)  Wt 147 lb (66.679 kg)  BMI 26.88 kg/m2  SpO2 96%  Wt Readings from Last 3 Encounters:  07/13/16 147 lb (66.679 kg)  04/13/16 158 lb (71.668 kg)  01/13/16 159 lb (72.122 kg)    Physical Exam Constitutional: overweight, in NAD Eyes: PERRLA, EOMI, no exophthalmos ENT: moist mucous membranes, no thyromegaly, no cervical lymphadenopathy Cardiovascular: RRR, No MRG Respiratory: CTA B Gastrointestinal: abdomen soft, NT, ND, BS+ Musculoskeletal: no deformities, strength intact in all 4  Skin: moist, warm, no rashes Neurological: no tremor with outstretched hands, DTR normal in all 4  Recent Results (from the past 2160 hour(s))  T4, free     Status: None   Collection Time: 07/06/16  3:10 PM  Result Value Ref Range   Free T4 1.47 0.82 - 1.77 ng/dL  TSH     Status: Abnormal   Collection Time: 07/06/16  3:10 PM  Result Value Ref Range   TSH <0.006 (L) 0.450 - 4.500 uIU/mL     Assessment & Plan:   1. Hypothyroidism due to RAI She  is s/p RAI thyroid ablation on Apr 25 2013. Her TFts show over- replacement. This could be driven by her weight loss. I will lower her levothyroxine to 50 g by mouth every morning .  - We discussed about correct intake of levothyroxine, at fasting, with water, separated by at least 30 minutes from breakfast, and separated by more than 4 hours from calcium, iron, multivitamins, acid reflux medications (PPIs). -Patient is made aware of the fact that thyroid hormone replacement is needed for life, dose to be adjusted by periodic monitoring of thyroid function tests.  2. Prediabetes:  -Detailed carbohydrate management and exercise regimen is provided to the patient. I discussed the need to prevent diabetes to avoid  Complications. She has lost 11 pounds. I will repeat her A1c next visit.  - I advised patient to maintain close follow up with Purvis Kilts, MD for primary care needs. Follow up plan: Return in about 4 months (around 11/13/2016) for underactive thyroid, prediabetes.  Glade Lloyd, MD Phone: [phone]  Fax: [phone]   07/13/2016, 5:10 PM

## 2016-08-03 ENCOUNTER — Other Ambulatory Visit: Payer: Self-pay | Admitting: "Endocrinology

## 2016-08-07 ENCOUNTER — Other Ambulatory Visit: Payer: Self-pay

## 2016-08-07 MED ORDER — LEVOTHYROXINE SODIUM 50 MCG PO TABS: 50.0000 ug | ORAL_TABLET | Freq: Every day | ORAL | 1 refills | Status: DC

## 2016-09-15 ENCOUNTER — Other Ambulatory Visit: Payer: Self-pay | Admitting: "Endocrinology

## 2016-09-15 MED ORDER — LEVOTHYROXINE SODIUM 50 MCG PO TABS: 50.0000 ug | ORAL_TABLET | Freq: Every day | ORAL | 1 refills | Status: DC

## 2016-11-07 ENCOUNTER — Other Ambulatory Visit: Payer: Self-pay | Admitting: "Endocrinology

## 2016-11-08 LAB — TSH: TSH: 0.039 u[IU]/mL — ABNORMAL LOW (ref 0.450–4.500)

## 2016-11-08 LAB — T4, FREE: FREE T4: 1.16 ng/dL (ref 0.82–1.77)

## 2016-11-08 LAB — HGB A1C W/O EAG: Hgb A1c MFr Bld: 5.5 % (ref 4.8–5.6)

## 2016-11-13 ENCOUNTER — Encounter: Payer: Self-pay | Admitting: "Endocrinology

## 2016-11-13 ENCOUNTER — Ambulatory Visit (INDEPENDENT_AMBULATORY_CARE_PROVIDER_SITE_OTHER): Payer: Commercial Managed Care - HMO | Admitting: "Endocrinology

## 2016-11-13 VITALS — BP 126/78 | HR 78 | Ht 62.0 in | Wt 148.0 lb

## 2016-11-13 DIAGNOSIS — R7303 Prediabetes: Secondary | ICD-10-CM | POA: Diagnosis not present

## 2016-11-13 DIAGNOSIS — E890 Postprocedural hypothyroidism: Secondary | ICD-10-CM

## 2016-11-13 NOTE — Progress Notes (Signed)
Subjective:    Patient ID: Theresa Vincent, female    DOB: 15-Jan-1951, PCP Purvis Kilts, MD   Past Medical History:  Diagnosis Date  . Arthritis    in back   . Diverticula of colon   . Diverticulosis   . Hemorrhoid   . Thyroid disease    was hyperthyroid had frozen and now on meds   Past Surgical History:  Procedure Laterality Date  . Broken toe screw in toe    . EYE SURGERY  03/20/16  . pt had her bladder stretched in the past     Dr. Michela Pitcher   . radiation thyroid     Social History   Social History  . Marital status: Legally Separated    Spouse name: N/A  . Number of children: N/A  . Years of education: hs    Occupational History  .  [employer]   Social History Main Topics  . Smoking status: Current Some Day Smoker    Packs/day: 0.00    Years: 30.00    Types: Cigarettes  . Smokeless tobacco: Never Used  . Alcohol use Yes     Comment: rarely   . Drug use: No  . Sexual activity: Not Currently    Birth control/ protection: Post-menopausal   Other Topics Concern  . None   Social History Narrative  . None   Outpatient Encounter Prescriptions as of 11/13/2016  Medication Sig  . latanoprost (XALATAN) 0.005 % ophthalmic solution INSTILL 1 DROP IN BOTH EYES NIGHTLY  . levothyroxine (SYNTHROID, LEVOTHROID) 50 MCG tablet Take 1 tablet (50 mcg total) by mouth daily.  . Multiple Vitamin (MULTIVITAMIN WITH MINERALS) TABS tablet Take 1 tablet by mouth daily.  Marland Kitchen XIIDRA 5 % SOLN   . [DISCONTINUED] levothyroxine (SYNTHROID, LEVOTHROID) 50 MCG tablet Take 1 tablet (50 mcg total) by mouth daily before breakfast.   No facility-administered encounter medications on file as of 11/13/2016.    ALLERGIES: Allergies  Allergen Reactions  . Avelox [Moxifloxacin Hcl In Nacl] Itching   VACCINATION STATUS: Immunization History  Administered Date(s) Administered  . Influenza-Unspecified 11/04/2013    HPI  Mrs. Tusing is  here to f/u for her RAI induced  hypothyroidism. She is s/p RAI thyroid ablation on May 2nd 2014. She is on [facility] replacement 75 mcg.  She feels better, has more energy. She has steady weight since last visit.   Denies palpitations, tremors, heat intolerance. she still smokes. she denies family hx of thyroid dysfunction. denies hx of goiter.   Review of Systems Constitutional: - steady weight   - fatigue, no subjective hyperthermia/hypothermia Eyes: no blurry vision, no xerophthalmia ENT: no sore throat, no nodules palpated in throat, no dysphagia/odynophagia, no hoarseness Cardiovascular: no CP/SOB/palpitations/leg swelling Respiratory: no cough/SOB Gastrointestinal: no N/V/D/C Musculoskeletal: no muscle/joint aches Skin: no rashes Neurological: no tremors/numbness/tingling/dizziness Psychiatric: no depression/anxiety  Objective:    BP 126/78   Pulse 78   Ht '5\' 2"'$  (1.575 m)   Wt 148 lb (67.1 kg)   BMI 27.07 kg/m   Wt Readings from Last 3 Encounters:  11/13/16 148 lb (67.1 kg)  07/13/16 147 lb (66.7 kg)  04/13/16 158 lb (71.7 kg)    Physical Exam Constitutional: overweight, in NAD Eyes: PERRLA, EOMI, no exophthalmos ENT: moist mucous membranes, no thyromegaly, no cervical lymphadenopathy Cardiovascular: RRR, No MRG Respiratory: CTA B Gastrointestinal: abdomen soft, NT, ND, BS+ Musculoskeletal: no deformities, strength intact in all 4 Skin: moist, warm, no rashes Neurological: no tremor  with outstretched hands, DTR normal in all 4  Recent Results (from the past 2160 hour(s))  Hgb A1c w/o eAG     Status: None   Collection Time: 11/07/16  2:33 PM  Result Value Ref Range   Hgb A1c MFr Bld 5.5 4.8 - 5.6 %    Comment:          Pre-diabetes: 5.7 - 6.4          Diabetes: >6.4          Glycemic control for adults with diabetes: <7.0   T4, free     Status: None   Collection Time: 11/07/16  2:33 PM  Result Value Ref Range   Free T4 1.16 0.82 - 1.77 ng/dL  TSH     Status: Abnormal   Collection Time:  11/07/16  2:33 PM  Result Value Ref Range   TSH 0.039 (L) 0.450 - 4.500 uIU/mL     Assessment & Plan:   1. Hypothyroidism due to RAI She is s/p RAI thyroid ablation on Apr 25 2013. Her TFts show proper- replacement.  I will continue her levothyroxine to 50 g by mouth every morning .  - We discussed about correct intake of levothyroxine, at fasting, with water, separated by at least 30 minutes from breakfast, and separated by more than 4 hours from calcium, iron, multivitamins, acid reflux medications (PPIs). -Patient is made aware of the fact that thyroid hormone replacement is needed for life, dose to be adjusted by periodic monitoring of thyroid function tests.  2. Prediabetes:  -Detailed carbohydrate management and exercise regimen is provided to the patient. I discussed the need to prevent diabetes to avoid  Complications. She has lost 11 pounds. I will repeat her A1c next visit.  - I advised patient to maintain close follow up with Purvis Kilts, MD for primary care needs. Follow up plan: Return in about 6 months (around 05/13/2017) for follow up with pre-visit labs.  Glade Lloyd, MD Phone: [phone]  Fax: [phone]   11/13/2016, 12:50 PM

## 2017-02-28 ENCOUNTER — Ambulatory Visit (HOSPITAL_COMMUNITY)
Admission: RE | Admit: 2017-02-28 | Discharge: 2017-02-28 | Disposition: A | Discharge status: Home / Self Care | Payer: Commercial Managed Care - HMO | Source: Ambulatory Visit | Attending: Family Medicine | Admitting: Family Medicine

## 2017-02-28 ENCOUNTER — Other Ambulatory Visit (HOSPITAL_COMMUNITY): Payer: Self-pay | Admitting: Family Medicine

## 2017-02-28 DIAGNOSIS — W19XXXD Unspecified fall, subsequent encounter: Secondary | ICD-10-CM

## 2017-02-28 DIAGNOSIS — R0781 Pleurodynia: Secondary | ICD-10-CM | POA: Diagnosis not present

## 2017-03-04 ENCOUNTER — Encounter (HOSPITAL_COMMUNITY): Payer: Self-pay | Admitting: Emergency Medicine

## 2017-03-04 ENCOUNTER — Emergency Department (HOSPITAL_COMMUNITY): Payer: Commercial Managed Care - HMO

## 2017-03-04 ENCOUNTER — Emergency Department (HOSPITAL_COMMUNITY)
Admission: EM | Admit: 2017-03-04 | Discharge: 2017-03-04 | Disposition: A | Discharge status: Home / Self Care | Payer: Commercial Managed Care - HMO | Attending: Emergency Medicine | Admitting: Emergency Medicine

## 2017-03-04 DIAGNOSIS — E039 Hypothyroidism, unspecified: Secondary | ICD-10-CM | POA: Diagnosis not present

## 2017-03-04 DIAGNOSIS — F17210 Nicotine dependence, cigarettes, uncomplicated: Secondary | ICD-10-CM | POA: Insufficient documentation

## 2017-03-04 DIAGNOSIS — Y999 Unspecified external cause status: Secondary | ICD-10-CM | POA: Insufficient documentation

## 2017-03-04 DIAGNOSIS — S6991XA Unspecified injury of right wrist, hand and finger(s), initial encounter: Secondary | ICD-10-CM | POA: Diagnosis present

## 2017-03-04 DIAGNOSIS — W11XXXA Fall on and from ladder, initial encounter: Secondary | ICD-10-CM | POA: Insufficient documentation

## 2017-03-04 DIAGNOSIS — Z79899 Other long term (current) drug therapy: Secondary | ICD-10-CM | POA: Diagnosis not present

## 2017-03-04 DIAGNOSIS — Y929 Unspecified place or not applicable: Secondary | ICD-10-CM | POA: Diagnosis not present

## 2017-03-04 DIAGNOSIS — S52571A Other intraarticular fracture of lower end of right radius, initial encounter for closed fracture: Secondary | ICD-10-CM | POA: Insufficient documentation

## 2017-03-04 DIAGNOSIS — S62101A Fracture of unspecified carpal bone, right wrist, initial encounter for closed fracture: Secondary | ICD-10-CM

## 2017-03-04 DIAGNOSIS — Y939 Activity, unspecified: Secondary | ICD-10-CM | POA: Diagnosis not present

## 2017-03-04 DIAGNOSIS — S52611A Displaced fracture of right ulna styloid process, initial encounter for closed fracture: Secondary | ICD-10-CM | POA: Insufficient documentation

## 2017-03-04 MED ORDER — LIDOCAINE HCL (PF) 2 % IJ SOLN: 10.0000 mL | Freq: Once | INTRAMUSCULAR | Status: DC

## 2017-03-04 MED ORDER — HYDROCODONE-ACETAMINOPHEN 5-325 MG PO TABS: 1.0000 | ORAL_TABLET | ORAL | 0 refills | Status: DC | PRN

## 2017-03-04 MED ORDER — LIDOCAINE HCL (PF) 2 % IJ SOLN
INTRAMUSCULAR | Status: AC
  Filled 2017-03-04: qty 10

## 2017-03-04 NOTE — Discharge Instructions (Signed)
Keep your arm elevated and splinted.  Call Dr. Levell July office tomorrow to arrange a follow-up appt.

## 2017-03-04 NOTE — ED Triage Notes (Signed)
Pt fell approximately 2 feet from step ladder x 1 hour ago. C/o right wrist pain. denies hitting head/loc.

## 2017-03-04 NOTE — ED Provider Notes (Signed)
[place] DEPT Provider Note   CSN: [id number] Arrival date & time: 03/04/17  1708  By signing my name below, I, Collene Leyden, attest that this documentation has been prepared under the direction and in the presence of Christyana Corwin PA-C.  Electronically Signed: Collene Leyden, Scribe. 03/04/17. 5:59 PM.  History   Chief Complaint Chief Complaint  Patient presents with  . Wrist Injury   HPI Comments: Theresa Vincent is a 66 y.o. female with a hx of a left wrist fracture, who presents to the Emergency Department complaining of sudden-onset, constant right wrist pain that began today. Patient reports falling 2 feet from a step ladder, in which she fell onto her right hand. Patient states this accident happened one hour prior to arrival.  Patient reports associated swelling tingling that radiates to the elbow.  Patient describes the severity of her pain as a 7/10. No modifying factors indicated. Patient denies any any elbow pain, head injury, neck pain, back pain, loss of consciousness, numbness or tingling of the fingers to the right hand.  The history is provided by the patient. No language interpreter was used.    Past Medical History:  Diagnosis Date  . Arthritis    in back   . Diverticula of colon   . Diverticulosis   . Hemorrhoid   . Thyroid disease    was hyperthyroid had frozen and now on meds    Patient Active Problem List   Diagnosis Date Noted  . Hypothyroidism following radioiodine therapy 01/13/2016  . Prediabetes 01/13/2016  . Fracture of fourth toe, right, closed 06/08/2014  . Wrist fracture 05/02/2011  . FRACTURE, RADIUS, DISTAL 03/06/2011  . BLOOD IN STOOL 07/13/2009  . ARTHRITIS, BACK 07/13/2009    Past Surgical History:  Procedure Laterality Date  . Broken toe screw in toe    . EYE SURGERY  03/20/16  . pt had her bladder stretched in the past     Dr. Michela Pitcher   . radiation thyroid      OB History    Gravida Para Term Preterm AB Living   '1 1        1   '$ SAB TAB Ectopic Multiple Live Births           1       Home Medications    Prior to Admission medications   Medication Sig Start Date End Date Taking? Authorizing Provider  latanoprost (XALATAN) 0.005 % ophthalmic solution INSTILL 1 DROP IN BOTH EYES NIGHTLY 03/31/16   Historical Provider, MD  levothyroxine (SYNTHROID, LEVOTHROID) 50 MCG tablet Take 1 tablet (50 mcg total) by mouth daily. 09/15/16   Cassandria Anger, MD  Multiple Vitamin (MULTIVITAMIN WITH MINERALS) TABS tablet Take 1 tablet by mouth daily.    Historical Provider, MD  Shirley Friar 5 % SOLN  11/01/16   Historical Provider, MD    Family History Family History  Problem Relation Age of Onset  . Diabetes Mother   . Heart disease Father   . Diabetes Father   . Diabetes Sister   . Diabetes Sister   . Cancer Sister     breast    Social History Social History  Substance Use Topics  . Smoking status: Current Some Day Smoker    Packs/day: 0.00    Years: 30.00    Types: Cigarettes  . Smokeless tobacco: Never Used  . Alcohol use Yes     Comment: rarely      Allergies   Avelox [moxifloxacin hcl in  nacl]   Review of Systems Review of Systems  Constitutional: Negative for chills and fever.  Cardiovascular: Negative for chest pain.  Gastrointestinal: Negative for nausea and vomiting.  Musculoskeletal: Positive for arthralgias (right wrist pain) and joint swelling (right wrist). Negative for back pain and neck pain.  Skin: Negative for color change and wound.  Neurological: Negative for syncope, numbness and headaches.  All other systems reviewed and are negative.    Physical Exam Updated Vital Signs BP 109/72   Pulse 68   Temp 98.1 F (36.7 C)   Resp 18   Ht '5\' 2"'$  (1.575 m)   Wt 149 lb (67.6 kg)   SpO2 98%   BMI 27.25 kg/m   Physical Exam  Constitutional: She is oriented to person, place, and time. She appears well-developed. No distress.  HENT:  Head: Normocephalic and atraumatic.    Mouth/Throat: Oropharynx is clear and moist.  Eyes: EOM are normal. Pupils are equal, round, and reactive to light.  Neck: Normal range of motion. Neck supple.  Cardiovascular: Normal rate, regular rhythm and intact distal pulses.   Right radial pulse brisk  Pulmonary/Chest: Effort normal and breath sounds normal.  Musculoskeletal: Normal range of motion. She exhibits edema, tenderness and deformity.  Tenderness and bony deformity of the distal right wrist. Patient able to move fingers without difficulty. Right elbow non-tender.  Compartments soft  Neurological: She is alert and oriented to person, place, and time.  Distal sensation intact.   Skin: Skin is warm and dry. Capillary refill takes less than 2 seconds. No rash noted.  Psychiatric: She has a normal mood and affect.  Nursing note and vitals reviewed.    ED Treatments / Results  DIAGNOSTIC STUDIES: Oxygen Saturation is 98% on RA, normal by my interpretation.    COORDINATION OF CARE: 5:59 PM Discussed treatment plan with pt at bedside and pt agreed to plan, which includes pain medication and an orthopedic follow-up.   Labs (all labs ordered are listed, but only abnormal results are displayed) Labs Reviewed - No data to display  EKG  EKG Interpretation None       Radiology Dg Wrist Complete Right  Result Date: 03/04/2017 CLINICAL DATA:  Post reduction, fell from ladder 1 hour ago. EXAM: RIGHT WRIST - COMPLETE 3+ VIEW COMPARISON:  RIGHT wrist radiograph March 04, 2017 at 1718 hours. FINDINGS: Acute comminuted distal radial fracture in improved alignment. Similarly displaced ulnar styloid fracture. No dislocation. Osteopenia without destructive bony lesions. Soft tissue swelling. Thick fiberglass splint obscures the fine bony detail. IMPRESSION: Acute distal radial fracture in improved alignment, similarly displaced ulnar styloid fractures. Fiberglass splint. Electronically Signed   By: Elon Alas M.D.   On:  03/04/2017 19:24   Dg Wrist Complete Right  Result Date: 03/04/2017 CLINICAL DATA:  Acute right wrist pain following fall today. EXAM: RIGHT WRIST - COMPLETE 3+ VIEW COMPARISON:  None. FINDINGS: A comminuted impacted intra-articular distal radial fracture is noted with apex oval or angulation. An ulnar styloid fracture is noted. There is no evidence of subluxation or dislocation. No other significant abnormalities identified. IMPRESSION: Comminuted impacted intra-articular distal radial fracture and ulnar styloid fracture. Electronically Signed   By: Margarette Canada M.D.   On: 03/04/2017 17:36    Procedures ORTHOPEDIC INJURY TREATMENT Date/Time: 03/04/2017 6:55 PM Performed by: Jola Schmidt Authorized by: Jola Schmidt  Consent: Verbal consent obtained. Written consent not obtained. Risks and benefits: risks, benefits and alternatives were discussed Consent given by: patient Patient understanding: patient states  understanding of the procedure being performed Imaging studies: imaging studies available Patient identity confirmed: verbally with patient and arm band Time out: Immediately prior to procedure a "time out" was called to verify the correct patient, procedure, equipment, support staff and site/side marked as required. Injury location: wrist Location details: right wrist Injury type: fracture Fracture type: distal radius and distal radius and ulnar styloid Pre-procedure neurovascular assessment: neurovascularly intact Pre-procedure distal perfusion: normal Pre-procedure neurological function: normal Pre-procedure range of motion: reduced Anesthesia: hematoma block  Anesthesia: Local anesthesia used: yes Local Anesthetic: lidocaine 2% without epinephrine Anesthetic total: 10 mL  Sedation: Patient sedated: no Manipulation performed: yes Skin traction used: yes Skeletal traction used: yes Reduction successful: yes X-ray confirmed reduction: yes Splint type: sugar  tong Supplies used: cotton padding,  elastic bandage and Ortho-Glass Post-procedure neurovascular assessment: post-procedure neurovascularly intact Post-procedure distal perfusion: normal Post-procedure neurological function: normal Post-procedure range of motion: improved Patient tolerance: Patient tolerated the procedure well with no immediate complications    (including critical care time)    Medications Ordered in ED Medications - No data to display   Initial Impression / Assessment and Plan / ED Course  I have reviewed the triage vital signs and the nursing notes.  Pertinent labs & imaging results that were available during my care of the patient were reviewed by me and considered in my medical decision making (see chart for details).     Pt with impacted wrist fx secondary to fall. Pt seen with Dr. Venora Maples who performed reduction procedure using finger traps. Sugar tong splint applied by myself and Dr. Venora Maples. Sling given  Post-reduction film shows alignment improved.  NV intact.  Pt agrees to close orthopedic f/u with hand surgeon.       Final Clinical Impressions(s) / ED Diagnoses   Final diagnoses:  Wrist fracture, closed, right, initial encounter    New Prescriptions New Prescriptions   No medications on file   I personally performed the services described in this documentation, which was scribed in my presence. The recorded information has been reviewed and is accurate.     Kem Parkinson, PA-C 03/06/17 [place], MD 03/07/17 [phone]

## 2017-03-05 MED FILL — Hydrocodone-Acetaminophen Tab 5-325 MG: ORAL | Qty: 6 | Status: AC

## 2017-03-21 ENCOUNTER — Other Ambulatory Visit: Payer: Self-pay | Admitting: "Endocrinology

## 2017-04-26 ENCOUNTER — Other Ambulatory Visit: Payer: Self-pay | Admitting: Adult Health

## 2017-04-26 DIAGNOSIS — Z1231 Encounter for screening mammogram for malignant neoplasm of breast: Secondary | ICD-10-CM

## 2017-05-07 ENCOUNTER — Other Ambulatory Visit: Payer: Self-pay | Admitting: "Endocrinology

## 2017-05-07 ENCOUNTER — Ambulatory Visit (HOSPITAL_COMMUNITY)
Admission: RE | Admit: 2017-05-07 | Discharge: 2017-05-07 | Disposition: A | Discharge status: Home / Self Care | Payer: Commercial Managed Care - HMO | Source: Ambulatory Visit | Attending: Adult Health | Admitting: Adult Health

## 2017-05-07 DIAGNOSIS — Z1231 Encounter for screening mammogram for malignant neoplasm of breast: Secondary | ICD-10-CM

## 2017-05-08 LAB — T4, FREE: Free T4: 1.44 ng/dL (ref 0.82–1.77)

## 2017-05-08 LAB — TSH: TSH: 0.011 u[IU]/mL — AB (ref 0.450–4.500)

## 2017-05-17 ENCOUNTER — Encounter: Payer: Self-pay | Admitting: "Endocrinology

## 2017-05-17 ENCOUNTER — Ambulatory Visit (INDEPENDENT_AMBULATORY_CARE_PROVIDER_SITE_OTHER): Payer: Commercial Managed Care - HMO | Admitting: "Endocrinology

## 2017-05-17 VITALS — BP 126/78 | HR 69 | Ht 62.0 in | Wt 145.0 lb

## 2017-05-17 DIAGNOSIS — E890 Postprocedural hypothyroidism: Secondary | ICD-10-CM | POA: Diagnosis not present

## 2017-05-17 MED ORDER — LEVOTHYROXINE SODIUM 50 MCG PO TABS: 50.0000 ug | ORAL_TABLET | Freq: Every day | ORAL | 1 refills | Status: DC

## 2017-05-17 NOTE — Progress Notes (Signed)
Subjective:    Patient ID: Theresa Vincent, female    DOB: [birth year], PCP Theresa Sites, MD   Past Medical History:  Diagnosis Date  . Arthritis    in back   . Diverticula of colon   . Diverticulosis   . Hemorrhoid   . Thyroid disease    was hyperthyroid had frozen and now on meds   Past Surgical History:  Procedure Laterality Date  . Broken toe screw in toe    . EYE SURGERY  03/20/16  . pt had her bladder stretched in the past     Dr. Michela Pitcher   . radiation thyroid     Social History   Social History  . Marital status: Legally Separated    Spouse name: N/A  . Number of children: N/A  . Years of education: hs    Occupational History  .  [employer]   Social History Main Topics  . Smoking status: Current Some Day Smoker    Packs/day: 0.00    Years: 30.00    Types: Cigarettes  . Smokeless tobacco: Never Used  . Alcohol use Yes     Comment: rarely   . Drug use: No  . Sexual activity: Not Currently    Birth control/ protection: Post-menopausal   Other Topics Concern  . None   Social History Narrative  . None   Outpatient Encounter Prescriptions as of 05/17/2017  Medication Sig  . HYDROcodone-acetaminophen (NORCO) 5-325 MG tablet Take 1 tablet by mouth every 4 (four) hours as needed for moderate pain.  Marland Kitchen latanoprost (XALATAN) 0.005 % ophthalmic solution INSTILL 1 DROP IN BOTH EYES NIGHTLY  . levothyroxine (SYNTHROID) 50 MCG tablet Take 1 tablet (50 mcg total) by mouth daily before breakfast.  . Multiple Vitamin (MULTIVITAMIN WITH MINERALS) TABS tablet Take 1 tablet by mouth daily.  Marland Kitchen XIIDRA 5 % SOLN   . [DISCONTINUED] HYDROcodone-acetaminophen (NORCO) 5-325 MG tablet Take 1 tablet by mouth every 4 (four) hours as needed for moderate pain.  . [DISCONTINUED] levothyroxine (SYNTHROID, LEVOTHROID) 50 MCG tablet Take 1 tablet (50 mcg total) by mouth daily.  . [DISCONTINUED] SYNTHROID 50 MCG tablet TAKE 1 TABLET DAILY BEFORE BREAKFAST   No  facility-administered encounter medications on file as of 05/17/2017.    ALLERGIES: Allergies  Allergen Reactions  . Avelox [Moxifloxacin Hcl In Nacl] Itching   VACCINATION STATUS: Immunization History  Administered Date(s) Administered  . Influenza-Unspecified 11/04/2013    HPI  Theresa Vincent is  here to f/u for her RAI induced hypothyroidism. She is s/p RAI thyroid ablation on May 2nd 2014. She is on Synthroid 50 g by mouth every morning.  She feels better, has more energy. She has steady weight since last visit.   Denies palpitations, tremors, heat intolerance. she still smokes. she denies family hx of thyroid dysfunction. denies hx of goiter.   Review of Systems Constitutional: - steady weight   - fatigue, no subjective hyperthermia/hypothermia Eyes: no blurry vision, no xerophthalmia ENT: no sore throat, no nodules palpated in throat, no dysphagia/odynophagia, no hoarseness Cardiovascular: no CP/SOB/palpitations/leg swelling Respiratory: no cough/SOB Gastrointestinal: no N/V/D/C Musculoskeletal: no muscle/joint aches Skin: no rashes Neurological: no tremors/numbness/tingling/dizziness Psychiatric: no depression/anxiety  Objective:    BP 126/78   Pulse 69   Ht 5\' 2"  (1.575 m)   Wt 145 lb (65.8 kg)   BMI 26.52 kg/m   Wt Readings from Last 3 Encounters:  05/17/17 145 lb (65.8 kg)  03/04/17 149 lb (67.6 kg)  11/13/16 148 lb (67.1 kg)    Physical Exam Constitutional: overweight, in NAD Eyes: PERRLA, EOMI, no exophthalmos ENT: moist mucous membranes, no thyromegaly, no cervical lymphadenopathy Cardiovascular: RRR, No MRG Respiratory: CTA B Gastrointestinal: abdomen soft, NT, ND, BS+ Musculoskeletal: no deformities, strength intact in all 4 Skin: moist, warm, no rashes Neurological: no tremor with outstretched hands, DTR normal in all 4  Recent Results (from the past 2160 hour(s))  T4, free     Status: None   Collection Time: 05/07/17 11:26 AM  Result Value  Ref Range   Free T4 1.44 0.82 - 1.77 ng/dL  TSH     Status: Abnormal   Collection Time: 05/07/17 11:26 AM  Result Value Ref Range   TSH 0.011 (L) 0.450 - 4.500 uIU/mL     Assessment & Plan:   1. Hypothyroidism due to RAI She is s/p RAI thyroid ablation on May 2 , 2014. Her TFts show proper- replacement.  I will continue her Synthroid 50 g by mouth every morning .  - We discussed about correct intake of levothyroxine, at fasting, with water, separated by at least 30 minutes from breakfast, and separated by more than 4 hours from calcium, iron, multivitamins, acid reflux medications (PPIs). -Patient is made aware of the fact that thyroid hormone replacement is needed for life, dose to be adjusted by periodic monitoring of thyroid function tests.  2. Prediabetes:  -Detailed carbohydrate management and exercise regimen is provided to the patient. I discussed the need to prevent diabetes to avoid  Complications. She has lost 11 pounds. I will repeat her A1c next visit.  - I advised patient to maintain close follow up with Theresa Sites, MD for primary care needs. Follow up plan: No Follow-up on file.  Glade Lloyd, MD Phone: [phone]  Fax: [phone]   05/17/2017, 10:53 AM

## 2017-10-31 DIAGNOSIS — Z1389 Encounter for screening for other disorder: Secondary | ICD-10-CM | POA: Diagnosis not present

## 2017-10-31 DIAGNOSIS — J449 Chronic obstructive pulmonary disease, unspecified: Secondary | ICD-10-CM | POA: Diagnosis not present

## 2017-10-31 DIAGNOSIS — J069 Acute upper respiratory infection, unspecified: Secondary | ICD-10-CM | POA: Diagnosis not present

## 2017-10-31 DIAGNOSIS — E663 Overweight: Secondary | ICD-10-CM | POA: Diagnosis not present

## 2017-10-31 DIAGNOSIS — E782 Mixed hyperlipidemia: Secondary | ICD-10-CM | POA: Diagnosis not present

## 2017-10-31 DIAGNOSIS — E039 Hypothyroidism, unspecified: Secondary | ICD-10-CM | POA: Diagnosis not present

## 2017-10-31 DIAGNOSIS — K5730 Diverticulosis of large intestine without perforation or abscess without bleeding: Secondary | ICD-10-CM | POA: Diagnosis not present

## 2017-10-31 DIAGNOSIS — R000 Tachycardia, unspecified: Secondary | ICD-10-CM | POA: Diagnosis not present

## 2017-10-31 DIAGNOSIS — M810 Age-related osteoporosis without current pathological fracture: Secondary | ICD-10-CM | POA: Diagnosis not present

## 2017-10-31 DIAGNOSIS — Z23 Encounter for immunization: Secondary | ICD-10-CM | POA: Diagnosis not present

## 2017-10-31 DIAGNOSIS — Z0001 Encounter for general adult medical examination with abnormal findings: Secondary | ICD-10-CM | POA: Diagnosis not present

## 2017-10-31 DIAGNOSIS — L52 Erythema nodosum: Secondary | ICD-10-CM | POA: Diagnosis not present

## 2017-10-31 DIAGNOSIS — Z6826 Body mass index (BMI) 26.0-26.9, adult: Secondary | ICD-10-CM | POA: Diagnosis not present

## 2017-11-06 ENCOUNTER — Other Ambulatory Visit (HOSPITAL_COMMUNITY): Payer: Self-pay | Admitting: Family Medicine

## 2017-11-06 DIAGNOSIS — F17290 Nicotine dependence, other tobacco product, uncomplicated: Secondary | ICD-10-CM

## 2017-11-12 ENCOUNTER — Other Ambulatory Visit: Payer: Self-pay | Admitting: "Endocrinology

## 2017-11-12 DIAGNOSIS — E890 Postprocedural hypothyroidism: Secondary | ICD-10-CM | POA: Diagnosis not present

## 2017-11-13 LAB — TSH: TSH: 0.008 u[IU]/mL — ABNORMAL LOW (ref 0.450–4.500)

## 2017-11-13 LAB — T4, FREE: FREE T4: 1.39 ng/dL (ref 0.82–1.77)

## 2017-11-19 ENCOUNTER — Encounter: Payer: Self-pay | Admitting: "Endocrinology

## 2017-11-19 ENCOUNTER — Ambulatory Visit (INDEPENDENT_AMBULATORY_CARE_PROVIDER_SITE_OTHER): Payer: Medicare Other | Admitting: "Endocrinology

## 2017-11-19 VITALS — BP 133/68 | HR 59 | Ht 62.0 in | Wt 145.0 lb

## 2017-11-19 DIAGNOSIS — R7303 Prediabetes: Secondary | ICD-10-CM | POA: Diagnosis not present

## 2017-11-19 DIAGNOSIS — E890 Postprocedural hypothyroidism: Secondary | ICD-10-CM

## 2017-11-19 MED ORDER — LEVOTHYROXINE SODIUM 50 MCG PO TABS
50.0000 ug | ORAL_TABLET | Freq: Every day | ORAL | 1 refills | Status: DC
Start: 2017-11-19 — End: 2017-12-27

## 2017-11-19 NOTE — Progress Notes (Signed)
Subjective:    Patient ID: Theresa Vincent, female    DOB: June 13, 1951, PCP Sharilyn Sites, MD   Past Medical History:  Diagnosis Date  . Arthritis    in back   . Diverticula of colon   . Diverticulosis   . Hemorrhoid   . Thyroid disease    was hyperthyroid had frozen and now on meds   Past Surgical History:  Procedure Laterality Date  . Broken toe screw in toe    . EYE SURGERY  03/20/16  . pt had her bladder stretched in the past     Dr. Michela Pitcher   . radiation thyroid     Social History   Socioeconomic History  . Marital status: Legally Separated    Spouse name: None  . Number of children: None  . Years of education: hs   . Highest education level: None  Social Needs  . Financial resource strain: None  . Food insecurity - worry: None  . Food insecurity - inability: None  . Transportation needs - medical: None  . Transportation needs - non-medical: None  Occupational History    Employer: [employer]  Tobacco Use  . Smoking status: Current Some Day Smoker    Packs/day: 0.00    Years: 30.00    Pack years: 0.00    Types: Cigarettes  . Smokeless tobacco: Never Used  Substance and Sexual Activity  . Alcohol use: Yes    Comment: rarely   . Drug use: No  . Sexual activity: Not Currently    Birth control/protection: Post-menopausal  Other Topics Concern  . None  Social History Narrative  . None   Outpatient Encounter Medications as of 11/19/2017  Medication Sig  . HYDROcodone-acetaminophen (NORCO) 5-325 MG tablet Take 1 tablet by mouth every 4 (four) hours as needed for moderate pain.  Marland Kitchen latanoprost (XALATAN) 0.005 % ophthalmic solution INSTILL 1 DROP IN BOTH EYES NIGHTLY  . levothyroxine (SYNTHROID) 50 MCG tablet Take 1 tablet (50 mcg total) by mouth daily before breakfast.  . Multiple Vitamin (MULTIVITAMIN WITH MINERALS) TABS tablet Take 1 tablet by mouth daily.  Marland Kitchen XIIDRA 5 % SOLN   . [DISCONTINUED] levothyroxine (SYNTHROID) 50 MCG tablet Take 1 tablet  (50 mcg total) by mouth daily before breakfast.   No facility-administered encounter medications on file as of 11/19/2017.    ALLERGIES: Allergies  Allergen Reactions  . Avelox [Moxifloxacin Hcl In Nacl] Itching   VACCINATION STATUS: Immunization History  Administered Date(s) Administered  . Influenza-Unspecified 11/04/2013    HPI  Theresa Vincent is  here to f/u for her RAI induced hypothyroidism. She is s/p RAI thyroid ablation on May 2nd 2014. She is on Synthroid 50 g by mouth every morning.  She feels better, has more energy. She has steady weight since last visit.   Denies palpitations, tremors, heat intolerance. she still smokes. she denies family hx of thyroid dysfunction. denies hx of goiter.   Review of Systems Constitutional: + steady weight   - fatigue, no subjective hyperthermia/hypothermia Eyes: no blurry vision, no xerophthalmia ENT: no sore throat, no nodules palpated in throat, no dysphagia/odynophagia, no hoarseness Cardiovascular: no CP/SOB/palpitations/leg swelling Respiratory: +on and off  Cough,  -SOB Gastrointestinal: no N/V/D/C Musculoskeletal: no muscle/joint aches Skin: no rashes Neurological: no tremors/numbness/tingling/dizziness Psychiatric: no depression/anxiety  Objective:    BP 133/68   Pulse (!) 59   Ht 5\' 2"  (1.575 m)   Wt 145 lb (65.8 kg)   BMI 26.52 kg/m  Wt Readings from Last 3 Encounters:  11/19/17 145 lb (65.8 kg)  05/17/17 145 lb (65.8 kg)  03/04/17 149 lb (67.6 kg)    Physical Exam Constitutional:  in NAD Eyes: PERRLA, EOMI, no exophthalmos ENT: moist mucous membranes, no thyromegaly, no cervical lymphadenopathy Cardiovascular: RRR, No MRG Respiratory: CTA B Gastrointestinal: abdomen soft, NT, ND, BS+ Musculoskeletal: no deformities, strength intact in all 4 Skin: moist, warm, no rashes Neurological: no tremor with outstretched hands, DTR normal in all 4  Recent Results (from the past 2160 hour(s))  T4, free      Status: None   Collection Time: 11/12/17  9:34 AM  Result Value Ref Range   Free T4 1.39 0.82 - 1.77 ng/dL  TSH     Status: Abnormal   Collection Time: 11/12/17  9:34 AM  Result Value Ref Range   TSH 0.008 (L) 0.450 - 4.500 uIU/mL     Assessment & Plan:   1. Hypothyroidism due to RAI She is s/p RAI thyroid ablation on May 2 , 2014. Her TFTs show proper- replacement. Her TSH remained suppressed, however free T4 is appropriate and clinically she is not thyrotoxic. I will continue her Synthroid 50 g by mouth every morning .  - We discussed about correct intake of levothyroxine, at fasting, with water, separated by at least 30 minutes from breakfast, and separated by more than 4 hours from calcium, iron, multivitamins, acid reflux medications (PPIs). -Patient is made aware of the fact that thyroid hormone replacement is needed for life, dose to be adjusted by periodic monitoring of thyroid function tests.  2. Prediabetes:  She is not on any medications. I will repeat her A1c next visit.  - I advised patient to maintain close follow up with Sharilyn Sites, MD for primary care needs. Follow up plan: Return in about 6 months (around 05/19/2018).  Glade Lloyd, MD Phone: [phone]  Fax: [phone]   11/19/2017, 10:06 AM

## 2017-11-21 ENCOUNTER — Ambulatory Visit (HOSPITAL_COMMUNITY): Payer: Medicare Other

## 2017-11-27 DIAGNOSIS — E663 Overweight: Secondary | ICD-10-CM | POA: Diagnosis not present

## 2017-11-27 DIAGNOSIS — L299 Pruritus, unspecified: Secondary | ICD-10-CM | POA: Diagnosis not present

## 2017-11-27 DIAGNOSIS — T7840XA Allergy, unspecified, initial encounter: Secondary | ICD-10-CM | POA: Diagnosis not present

## 2017-11-27 DIAGNOSIS — Z6826 Body mass index (BMI) 26.0-26.9, adult: Secondary | ICD-10-CM | POA: Diagnosis not present

## 2017-12-27 ENCOUNTER — Telehealth: Payer: Self-pay | Admitting: "Endocrinology

## 2017-12-27 MED ORDER — LEVOTHYROXINE SODIUM 50 MCG PO TABS: 50.0000 ug | ORAL_TABLET | Freq: Every day | ORAL | 1 refills | Status: DC

## 2017-12-27 NOTE — Telephone Encounter (Signed)
Theresa Vincent is calling stating that her insurance wont pay for her medication to go thru mail order she states to just call it into [facility] in [place], Please advise?

## 2018-01-16 DIAGNOSIS — H04123 Dry eye syndrome of bilateral lacrimal glands: Secondary | ICD-10-CM | POA: Diagnosis not present

## 2018-01-16 DIAGNOSIS — E0500 Thyrotoxicosis with diffuse goiter without thyrotoxic crisis or storm: Secondary | ICD-10-CM | POA: Diagnosis not present

## 2018-01-16 DIAGNOSIS — H40033 Anatomical narrow angle, bilateral: Secondary | ICD-10-CM | POA: Diagnosis not present

## 2018-01-16 DIAGNOSIS — H05822 Myopathy of extraocular muscles, left orbit: Secondary | ICD-10-CM | POA: Diagnosis not present

## 2018-01-25 ENCOUNTER — Other Ambulatory Visit: Payer: Self-pay | Admitting: Physician Assistant

## 2018-01-25 DIAGNOSIS — L570 Actinic keratosis: Secondary | ICD-10-CM | POA: Diagnosis not present

## 2018-01-25 DIAGNOSIS — D229 Melanocytic nevi, unspecified: Secondary | ICD-10-CM | POA: Diagnosis not present

## 2018-01-25 DIAGNOSIS — D485 Neoplasm of uncertain behavior of skin: Secondary | ICD-10-CM | POA: Diagnosis not present

## 2018-03-27 DIAGNOSIS — Z6827 Body mass index (BMI) 27.0-27.9, adult: Secondary | ICD-10-CM | POA: Diagnosis not present

## 2018-03-27 DIAGNOSIS — J069 Acute upper respiratory infection, unspecified: Secondary | ICD-10-CM | POA: Diagnosis not present

## 2018-03-27 DIAGNOSIS — E663 Overweight: Secondary | ICD-10-CM | POA: Diagnosis not present

## 2018-03-27 DIAGNOSIS — Z1389 Encounter for screening for other disorder: Secondary | ICD-10-CM | POA: Diagnosis not present

## 2018-04-08 ENCOUNTER — Encounter (INDEPENDENT_AMBULATORY_CARE_PROVIDER_SITE_OTHER): Payer: Self-pay

## 2018-04-08 ENCOUNTER — Encounter: Payer: Self-pay | Admitting: Adult Health

## 2018-04-08 ENCOUNTER — Ambulatory Visit (INDEPENDENT_AMBULATORY_CARE_PROVIDER_SITE_OTHER): Payer: Medicare Other | Admitting: Adult Health

## 2018-04-08 VITALS — BP 122/64 | HR 73 | Ht 62.0 in | Wt 148.0 lb

## 2018-04-08 DIAGNOSIS — N760 Acute vaginitis: Secondary | ICD-10-CM | POA: Diagnosis not present

## 2018-04-08 DIAGNOSIS — R1032 Left lower quadrant pain: Secondary | ICD-10-CM | POA: Diagnosis not present

## 2018-04-08 DIAGNOSIS — N898 Other specified noninflammatory disorders of vagina: Secondary | ICD-10-CM | POA: Insufficient documentation

## 2018-04-08 DIAGNOSIS — B9689 Other specified bacterial agents as the cause of diseases classified elsewhere: Secondary | ICD-10-CM

## 2018-04-08 DIAGNOSIS — N952 Postmenopausal atrophic vaginitis: Secondary | ICD-10-CM | POA: Diagnosis not present

## 2018-04-08 LAB — POCT WET PREP (WET MOUNT)
CLUE CELLS WET PREP WHIFF POC: POSITIVE
WBC WET PREP: POSITIVE

## 2018-04-08 MED ORDER — METRONIDAZOLE 0.75 % VA GEL
1.0000 | Freq: Every day | VAGINAL | 0 refills | Status: DC
Start: 2018-04-08 — End: 2018-07-04

## 2018-04-08 NOTE — Patient Instructions (Signed)
Bacterial Vaginosis Bacterial vaginosis is a vaginal infection that occurs when the normal balance of bacteria in the vagina is disrupted. It results from an overgrowth of certain bacteria. This is the most common vaginal infection among women ages 15-44. Because bacterial vaginosis increases your risk for STIs (sexually transmitted infections), getting treated can help reduce your risk for chlamydia, gonorrhea, herpes, and HIV (human immunodeficiency virus). Treatment is also important for preventing complications in pregnant women, because this condition can cause an early (premature) delivery. What are the causes? This condition is caused by an increase in harmful bacteria that are normally present in small amounts in the vagina. However, the reason that the condition develops is not fully understood. What increases the risk? The following factors may make you more likely to develop this condition:  Having a new sexual partner or multiple sexual partners.  Having unprotected sex.  Douching.  Having an intrauterine device (IUD).  Smoking.  Drug and alcohol abuse.  Taking certain antibiotic medicines.  Being pregnant.  You cannot get bacterial vaginosis from toilet seats, bedding, swimming pools, or contact with objects around you. What are the signs or symptoms? Symptoms of this condition include:  Grey or white vaginal discharge. The discharge can also be watery or foamy.  A fish-like odor with discharge, especially after sexual intercourse or during menstruation.  Itching in and around the vagina.  Burning or pain with urination.  Some women with bacterial vaginosis have no signs or symptoms. How is this diagnosed? This condition is diagnosed based on:  Your medical history.  A physical exam of the vagina.  Testing a sample of vaginal fluid under a microscope to look for a large amount of bad bacteria or abnormal cells. Your health care provider may use a cotton swab  or a small wooden spatula to collect the sample.  How is this treated? This condition is treated with antibiotics. These may be given as a pill, a vaginal cream, or a medicine that is put into the vagina (suppository). If the condition comes back after treatment, a second round of antibiotics may be needed. Follow these instructions at home: Medicines  Take over-the-counter and prescription medicines only as told by your health care provider.  Take or use your antibiotic as told by your health care provider. Do not stop taking or using the antibiotic even if you start to feel better. General instructions  If you have a female sexual partner, tell her that you have a vaginal infection. She should see her health care provider and be treated if she has symptoms. If you have a female sexual partner, he does not need treatment.  During treatment: ? Avoid sexual activity until you finish treatment. ? Do not douche. ? Avoid alcohol as directed by your health care provider. ? Avoid breastfeeding as directed by your health care provider.  Drink enough water and fluids to keep your urine clear or pale yellow.  Keep the area around your vagina and rectum clean. ? Wash the area daily with warm water. ? Wipe yourself from front to back after using the toilet.  Keep all follow-up visits as told by your health care provider. This is important. How is this prevented?  Do not douche.  Wash the outside of your vagina with warm water only.  Use protection when having sex. This includes latex condoms and dental dams.  Limit how many sexual partners you have. To help prevent bacterial vaginosis, it is best to have sex with just   one partner (monogamous).  Make sure you and your sexual partner are tested for STIs.  Wear cotton or cotton-lined underwear.  Avoid wearing tight pants and pantyhose, especially during summer.  Limit the amount of alcohol that you drink.  Do not use any products that  contain nicotine or tobacco, such as cigarettes and e-cigarettes. If you need help quitting, ask your health care provider.  Do not use illegal drugs. Where to find more information:  Centers for Disease Control and Prevention: www.cdc.gov/std  American Sexual Health Association (ASHA): www.ashastd.org  U.S. Department of Health and Human Services, Office on Women's Health: www.womenshealth.gov/ or https://www.womenshealth.gov/a-z-topics/bacterial-vaginosis Contact a health care provider if:  Your symptoms do not improve, even after treatment.  You have more discharge or pain when urinating.  You have a fever.  You have pain in your abdomen.  You have pain during sex.  You have vaginal bleeding between periods. Summary  Bacterial vaginosis is a vaginal infection that occurs when the normal balance of bacteria in the vagina is disrupted.  Because bacterial vaginosis increases your risk for STIs (sexually transmitted infections), getting treated can help reduce your risk for chlamydia, gonorrhea, herpes, and HIV (human immunodeficiency virus). Treatment is also important for preventing complications in pregnant women, because the condition can cause an early (premature) delivery.  This condition is treated with antibiotic medicines. These may be given as a pill, a vaginal cream, or a medicine that is put into the vagina (suppository). This information is not intended to replace advice given to you by your health care provider. Make sure you discuss any questions you have with your health care provider. Document Released: 12/11/2005 Document Revised: 04/16/2017 Document Reviewed: 08/26/2016 Elsevier Interactive Patient Education  2018 Elsevier Inc.  

## 2018-04-08 NOTE — Progress Notes (Signed)
Subjective:     Patient ID: Theresa Vincent, female   DOB: 05-04-1951, 67 y.o.   MRN: [id number]  HPI Theresa Vincent is a 67 year old, white female, PM in complaining of vaginal discharge and odor with itching, and has pain in left side, with no bowel changes. Her pap is due.   Review of Systems +vaginal discharge with odor +vaginal itch Pain in left side, no bowel changes Not having sex  Reviewed past medical,surgical, social and family history. Reviewed medications and allergies.     Objective:   Physical Exam BP 122/64 (BP Location: Right Arm, Patient Position: Sitting, Cuff Size: Normal)   Pulse 73   Ht 5\' 2"  (1.575 m)   Wt 148 lb (67.1 kg)   BMI 27.07 kg/m  Skin warm and dry.Pelvic: external genitalia is normal in appearance no lesions, vagina: white discharge with slight odor,+vaginal atrophy,urethra has no lesions or masses noted, cervix:smooth, uterus: normal size, shape and contour, non tender, no masses felt, adnexa: no masses, LLQ tenderness noted. Bladder is non tender and no masses felt. Wet prep: + for clue cells and +WBCs. Will get [place] and will rx metrogel, if not better,may add estrogen cream.     Assessment:     1. Vaginal discharge   2. Vaginal odor   3. BV (bacterial vaginosis)   4. Vaginal atrophy   5. LLQ pain       Plan:     Meds ordered this encounter  Medications  . metroNIDAZOLE (METROGEL) 0.75 % vaginal gel    Sig: Place 1 Applicatorful vaginally at bedtime.    Dispense:  70 g    Refill:  0    Order Specific Question:   Supervising Provider    Answer:   Tania Ade H [[id number]]  Return in 1 week for GYN [place], then see me the next week for pap and physical Review handout on BV

## 2018-04-16 ENCOUNTER — Ambulatory Visit (INDEPENDENT_AMBULATORY_CARE_PROVIDER_SITE_OTHER): Payer: Medicare Other

## 2018-04-16 DIAGNOSIS — R1032 Left lower quadrant pain: Secondary | ICD-10-CM

## 2018-04-16 NOTE — Progress Notes (Signed)
PELVIC US TA/TV: homogeneous anteverted uterus,wnl,EEC 5 mm,normal ovaries bilat,limited view of ovaries,no free fluid,no pain during ultrasound

## 2018-04-18 ENCOUNTER — Telehealth: Payer: Self-pay | Admitting: Adult Health

## 2018-04-18 NOTE — Telephone Encounter (Signed)
Pt aware [place] looks normal ECC 5 mm, may need endometrial bx, will talk next week at pap appt

## 2018-04-23 ENCOUNTER — Ambulatory Visit (INDEPENDENT_AMBULATORY_CARE_PROVIDER_SITE_OTHER): Payer: Medicare Other | Admitting: Adult Health

## 2018-04-23 ENCOUNTER — Encounter: Payer: Self-pay | Admitting: Adult Health

## 2018-04-23 ENCOUNTER — Other Ambulatory Visit (HOSPITAL_COMMUNITY)
Admission: RE | Admit: 2018-04-23 | Discharge: 2018-04-23 | Disposition: A | Discharge status: Home / Self Care | Payer: Medicare Other | Source: Ambulatory Visit | Attending: Adult Health | Admitting: Adult Health

## 2018-04-23 VITALS — BP 108/56 | HR 57 | Resp 14 | Ht 62.0 in | Wt 149.0 lb

## 2018-04-23 DIAGNOSIS — Z124 Encounter for screening for malignant neoplasm of cervix: Secondary | ICD-10-CM | POA: Diagnosis not present

## 2018-04-23 DIAGNOSIS — Z1212 Encounter for screening for malignant neoplasm of rectum: Secondary | ICD-10-CM

## 2018-04-23 DIAGNOSIS — Z01419 Encounter for gynecological examination (general) (routine) without abnormal findings: Secondary | ICD-10-CM

## 2018-04-23 DIAGNOSIS — Z7189 Other specified counseling: Secondary | ICD-10-CM | POA: Insufficient documentation

## 2018-04-23 DIAGNOSIS — Z1211 Encounter for screening for malignant neoplasm of colon: Secondary | ICD-10-CM | POA: Diagnosis not present

## 2018-04-23 LAB — HEMOCCULT GUIAC POC 1CARD (OFFICE): FECAL OCCULT BLD: NEGATIVE

## 2018-04-23 NOTE — Progress Notes (Signed)
Patient ID: Theresa Vincent, female   DOB: 06-Jun-1951, 67 y.o.   MRN: [id number] History of Present Illness: Theresa Vincent is a 67 year old white female, PM,in for a well woman gyn exam and pap.She was seen 2 weeks for vaginal discharge and odor and was given metrogel and is better, she also had LLQ discomfort then and had GYN [place] that was normal, EEC 5 mm. PCP is Dr Hilma Favors.   Current Medications, Allergies, Past Medical History, Past Surgical History, Family History and Social History were reviewed in [employer] record.     Review of Systems: Patient denies any headaches, hearing loss, fatigue, blurred vision, shortness of breath, chest pain, abdominal pain, problems with bowel movements, urination, or intercourse(not having sex). No joint pain or mood swings. +dry eyes,geting ready to have cataract surgery soon.    Physical Exam:BP (!) 108/56 (BP Location: Left Arm, Patient Position: Sitting, Cuff Size: Normal)   Pulse (!) 57   Resp 14   Ht 5\' 2"  (1.575 m)   Wt 149 lb (67.6 kg)   BMI 27.25 kg/m  General:  Well developed, well nourished, no acute distress Skin:  Warm and dry Neck:  Midline trachea, normal thyroid, good ROM, no lymphadenopathy,noo carotid bruits heard Lungs; Clear to auscultation bilaterally Breast:  No dominant palpable mass, retraction, or nipple discharge Cardiovascular: Regular rate and rhythm Abdomen:  Soft, non tender, no hepatosplenomegaly Pelvic:  External genitalia is normal in appearance, no lesions.  The vagina is pale with loss of moisture and rugae. Urethra has no lesions or masses. The cervix is smooth, pap with HPV performed.  Uterus is felt to be normal size, shape, and contour.  No adnexal masses or tenderness noted.Bladder is non tender, no masses felt. Rectal: Good sphincter tone, no polyps, or hemorrhoids felt.  Hemoccult negative. Extremities/musculoskeletal:  No swelling or varicosities noted, no clubbing or cyanosis Psych:  No mood  changes, alert and cooperative,seems happy PHQ 2 score 0.  Impression: 1. Encounter for gynecological examination with Papanicolaou smear of cervix   2. Screening for colorectal cancer       Plan: Physical in 2 years Pap in 2-3 if normal Labs with PCP Mammogram yearly Colonoscopy probably due Get chest CT as ordered

## 2018-04-25 LAB — CYTOLOGY - PAP
Diagnosis: NEGATIVE
HPV: NOT DETECTED

## 2018-04-30 DIAGNOSIS — Z719 Counseling, unspecified: Secondary | ICD-10-CM | POA: Diagnosis not present

## 2018-04-30 DIAGNOSIS — E039 Hypothyroidism, unspecified: Secondary | ICD-10-CM | POA: Diagnosis not present

## 2018-04-30 DIAGNOSIS — J019 Acute sinusitis, unspecified: Secondary | ICD-10-CM | POA: Diagnosis not present

## 2018-04-30 DIAGNOSIS — Z6827 Body mass index (BMI) 27.0-27.9, adult: Secondary | ICD-10-CM | POA: Diagnosis not present

## 2018-04-30 DIAGNOSIS — R599 Enlarged lymph nodes, unspecified: Secondary | ICD-10-CM | POA: Diagnosis not present

## 2018-04-30 DIAGNOSIS — R221 Localized swelling, mass and lump, neck: Secondary | ICD-10-CM | POA: Diagnosis not present

## 2018-04-30 DIAGNOSIS — E663 Overweight: Secondary | ICD-10-CM | POA: Diagnosis not present

## 2018-04-30 LAB — TSH: TSH: 0.01 — AB (ref 0.41–5.90)

## 2018-05-21 ENCOUNTER — Ambulatory Visit (INDEPENDENT_AMBULATORY_CARE_PROVIDER_SITE_OTHER): Payer: Medicare Other | Admitting: "Endocrinology

## 2018-05-21 ENCOUNTER — Encounter: Payer: Self-pay | Admitting: "Endocrinology

## 2018-05-21 VITALS — BP 135/78 | HR 69 | Ht 62.0 in | Wt 148.0 lb

## 2018-05-21 DIAGNOSIS — E890 Postprocedural hypothyroidism: Secondary | ICD-10-CM | POA: Diagnosis not present

## 2018-05-21 MED ORDER — LEVOTHYROXINE SODIUM 25 MCG PO TABS: 25.0000 ug | ORAL_TABLET | Freq: Every day | ORAL | 1 refills | Status: DC

## 2018-05-21 NOTE — Progress Notes (Signed)
Subjective:    Patient ID: Theresa Vincent, female    DOB: April 14, 1951, PCP Sharilyn Sites, MD   Past Medical History:  Diagnosis Date  . Arthritis    in back   . Diverticula of colon   . Diverticulosis   . Hemorrhoid   . Thyroid disease    was hyperthyroid had frozen and now on meds   Past Surgical History:  Procedure Laterality Date  . Broken toe screw in toe    . EYE SURGERY  03/20/16  . pt had her bladder stretched in the past     Dr. Michela Pitcher   . radiation thyroid    . WRIST SURGERY Right    Social History   Socioeconomic History  . Marital status: Legally Separated    Spouse name: Not on file  . Number of children: Not on file  . Years of education: hs   . Highest education level: Not on file  Occupational History    Employer: [employer]  . Financial resource strain: Not on file  . Food insecurity:    Worry: Not on file    Inability: Not on file  . Transportation needs:    Medical: Not on file    Non-medical: Not on file  Tobacco Use  . Smoking status: Current Every Day Smoker    Packs/day: 0.00    Years: 45.00    Pack years: 0.00    Types: Cigarettes  . Smokeless tobacco: Never Used  . Tobacco comment: smokes 3 cig daily  Substance and Sexual Activity  . Alcohol use: Yes    Comment: rarely   . Drug use: No  . Sexual activity: Not Currently    Birth control/protection: Post-menopausal  Lifestyle  . Physical activity:    Days per week: Not on file    Minutes per session: Not on file  . Stress: Not on file  Relationships  . Social connections:    Talks on phone: Not on file    Gets together: Not on file    Attends religious service: Not on file    Active member of club or organization: Not on file    Attends meetings of clubs or organizations: Not on file    Relationship status: Not on file  Other Topics Concern  . Not on file  Social History Narrative  . Not on file   Outpatient Encounter Medications as of 05/21/2018   Medication Sig  . Cholecalciferol (VITAMIN D PO) Take by mouth daily.  Marland Kitchen latanoprost (XALATAN) 0.005 % ophthalmic solution INSTILL 1 DROP IN BOTH EYES NIGHTLY  . levothyroxine (SYNTHROID, LEVOTHROID) 25 MCG tablet Take 1 tablet (25 mcg total) by mouth daily before breakfast.  . metroNIDAZOLE (METROGEL) 0.75 % vaginal gel Place 1 Applicatorful vaginally at bedtime. (Patient not taking: Reported on 04/23/2018)  . Multiple Vitamin (MULTIVITAMIN WITH MINERALS) TABS tablet Take 1 tablet by mouth daily.  Marland Kitchen XIIDRA 5 % SOLN   . [DISCONTINUED] levothyroxine (SYNTHROID) 50 MCG tablet Take 1 tablet (50 mcg total) by mouth daily before breakfast.   No facility-administered encounter medications on file as of 05/21/2018.    ALLERGIES: Allergies  Allergen Reactions  . Avelox [Moxifloxacin Hcl In Nacl] Itching   VACCINATION STATUS: Immunization History  Administered Date(s) Administered  . Influenza-Unspecified 11/04/2013    HPI  [name] is  here to follow-up for RAI induced hypothyroidism. She is s/p RAI thyroid ablation on May 2nd 2014. She is on Synthroid  50 g by mouth every morning.  She feels better, has more energy. She has steady weight since last visit.   Denies palpitations, tremors, heat intolerance. she still smokes. she denies family hx of thyroid dysfunction. denies hx of goiter.   Review of Systems Constitutional: + steady weight   - fatigue, no subjective hyperthermia/hypothermia Eyes: no blurry vision, no xerophthalmia ENT: no sore throat, + lymph node palpated on right submandibular area,  no dysphagia/odynophagia, no hoarseness Musculoskeletal: no muscle/joint aches Skin: no rashes Neurological: no tremors/numbness/tingling/dizziness Psychiatric: no depression/anxiety  Objective:    BP 135/78   Pulse 69   Ht 5\' 2"  (1.575 m)   Wt 148 lb (67.1 kg)   BMI 27.07 kg/m   Wt Readings from Last 3 Encounters:  05/21/18 148 lb (67.1 kg)  04/23/18 149 lb (67.6 kg)   04/08/18 148 lb (67.1 kg)    Physical Exam Constitutional:  + Not in acute distress Eyes: PERRLA, EOMI, no exophthalmos ENT: moist mucous membranes, no thyromegaly, no cervical lymphadenopathy Musculoskeletal: no deformities, strength intact in all 4 Skin: moist, warm, no rashes Neurological: no tremor with outstretched hands, DTR normal in all 4  Recent Results (from the past 2160 hour(s))  POCT Wet Prep [facility])     Status: Abnormal   Collection Time: 04/08/18  3:14 PM  Result Value Ref Range   Source Wet Prep POC vagina    WBC, Wet Prep HPF POC pos    Bacteria Wet Prep HPF POC  Few   BACTERIA WET PREP MORPHOLOGY POC     Clue Cells Wet Prep HPF POC Moderate (A) None   Clue Cells Wet Prep Whiff POC Positive Whiff    Yeast Wet Prep HPF POC     KOH Wet Prep POC     Trichomonas Wet Prep HPF POC  Absent  Cytology - PAP     Status: None   Collection Time: 04/23/18 12:00 AM  Result Value Ref Range   Adequacy      Satisfactory for evaluation  endocervical/transformation zone component PRESENT.   Diagnosis      NEGATIVE FOR INTRAEPITHELIAL LESIONS OR MALIGNANCY.   HPV NOT DETECTED     Comment: Normal Reference Range - NOT Detected   Material Submitted CervicoVaginal Pap [ThinPrep Imaged]   POCT occult blood stool     Status: None   Collection Time: 04/23/18  3:08 PM  Result Value Ref Range   Fecal Occult Blood, POC Negative Negative   Card #1 Date     Card #2 Fecal Occult Blod, POC     Card #2 Date     Card #3 Fecal Occult Blood, POC     Card #3 Date    TSH     Status: Abnormal   Collection Time: 04/30/18 12:00 AM  Result Value Ref Range   TSH 0.01 (A) 0.41 - 5.90     Assessment & Plan:   1. Hypothyroidism due to RAI She is s/p RAI thyroid ablation on May 2 , 2014. Her TFTs show still suppressed TSH  < 0.006.  Her labs did not include free T4.   -I approach her for lower dose of Synthroid, and she agrees.  I discussed and lowered her Synthroid to 25 mcg p.o. q.  a.m. with plan to repeat thyroid function tests in 3 months with office visit.  - We discussed about correct intake of levothyroxine, at fasting, with water, separated by at least 30 minutes from breakfast, and separated by  more than 4 hours from calcium, iron, multivitamins, acid reflux medications (PPIs). -Patient is made aware of the fact that thyroid hormone replacement is needed for life, dose to be adjusted by periodic monitoring of thyroid function tests.  2. Prediabetes:  She is not on any medications. I will repeat her A1c next visit.  3.  Right submandibular lymphadenopathy -3 cm firm lymph node on right submandibular area.  I advised her to address it with her PMD Dr. Hilma Favors.  She may need a core biopsy for proper work-up.    - I advised patient to maintain close follow up with Sharilyn Sites, MD for primary care needs. Follow up plan: Return in about 3 months (around 08/21/2018) for follow up with pre-visit labs.  Glade Lloyd, MD Phone: [phone]  Fax: [phone]   05/21/2018, 10:43 AM

## 2018-05-29 DIAGNOSIS — R590 Localized enlarged lymph nodes: Secondary | ICD-10-CM | POA: Diagnosis not present

## 2018-05-29 DIAGNOSIS — R221 Localized swelling, mass and lump, neck: Secondary | ICD-10-CM | POA: Diagnosis not present

## 2018-05-29 DIAGNOSIS — E039 Hypothyroidism, unspecified: Secondary | ICD-10-CM | POA: Diagnosis not present

## 2018-05-29 DIAGNOSIS — J019 Acute sinusitis, unspecified: Secondary | ICD-10-CM | POA: Diagnosis not present

## 2018-06-14 ENCOUNTER — Other Ambulatory Visit: Payer: Self-pay

## 2018-06-14 ENCOUNTER — Other Ambulatory Visit: Payer: Self-pay | Admitting: "Endocrinology

## 2018-06-14 MED ORDER — LEVOTHYROXINE SODIUM 25 MCG PO TABS: 25.0000 ug | ORAL_TABLET | Freq: Every day | ORAL | 0 refills | Status: DC

## 2018-06-17 ENCOUNTER — Ambulatory Visit (INDEPENDENT_AMBULATORY_CARE_PROVIDER_SITE_OTHER): Payer: Medicare Other | Admitting: Otolaryngology

## 2018-06-17 DIAGNOSIS — D487 Neoplasm of uncertain behavior of other specified sites: Secondary | ICD-10-CM

## 2018-06-17 DIAGNOSIS — R590 Localized enlarged lymph nodes: Secondary | ICD-10-CM | POA: Diagnosis not present

## 2018-06-18 ENCOUNTER — Other Ambulatory Visit (INDEPENDENT_AMBULATORY_CARE_PROVIDER_SITE_OTHER): Payer: Self-pay | Admitting: Otolaryngology

## 2018-06-18 DIAGNOSIS — R599 Enlarged lymph nodes, unspecified: Secondary | ICD-10-CM

## 2018-06-24 ENCOUNTER — Other Ambulatory Visit: Payer: Self-pay | Admitting: Radiology

## 2018-06-26 ENCOUNTER — Ambulatory Visit (HOSPITAL_COMMUNITY)
Admission: RE | Admit: 2018-06-26 | Discharge: 2018-06-26 | Disposition: A | Discharge status: Home / Self Care | Payer: Medicare Other | Source: Ambulatory Visit | Attending: Otolaryngology | Admitting: Otolaryngology

## 2018-06-26 ENCOUNTER — Encounter (HOSPITAL_COMMUNITY): Payer: Self-pay

## 2018-06-26 ENCOUNTER — Other Ambulatory Visit: Payer: Self-pay | Admitting: Student

## 2018-06-26 DIAGNOSIS — C801 Malignant (primary) neoplasm, unspecified: Secondary | ICD-10-CM | POA: Diagnosis not present

## 2018-06-26 DIAGNOSIS — R599 Enlarged lymph nodes, unspecified: Secondary | ICD-10-CM

## 2018-06-26 DIAGNOSIS — Z833 Family history of diabetes mellitus: Secondary | ICD-10-CM | POA: Diagnosis not present

## 2018-06-26 DIAGNOSIS — Z79899 Other long term (current) drug therapy: Secondary | ICD-10-CM | POA: Insufficient documentation

## 2018-06-26 DIAGNOSIS — Z79890 Hormone replacement therapy (postmenopausal): Secondary | ICD-10-CM | POA: Insufficient documentation

## 2018-06-26 DIAGNOSIS — Z881 Allergy status to other antibiotic agents status: Secondary | ICD-10-CM | POA: Insufficient documentation

## 2018-06-26 DIAGNOSIS — F17210 Nicotine dependence, cigarettes, uncomplicated: Secondary | ICD-10-CM | POA: Diagnosis not present

## 2018-06-26 DIAGNOSIS — C770 Secondary and unspecified malignant neoplasm of lymph nodes of head, face and neck: Secondary | ICD-10-CM | POA: Insufficient documentation

## 2018-06-26 DIAGNOSIS — R590 Localized enlarged lymph nodes: Secondary | ICD-10-CM | POA: Diagnosis not present

## 2018-06-26 DIAGNOSIS — Z8249 Family history of ischemic heart disease and other diseases of the circulatory system: Secondary | ICD-10-CM | POA: Diagnosis not present

## 2018-06-26 DIAGNOSIS — R221 Localized swelling, mass and lump, neck: Secondary | ICD-10-CM | POA: Diagnosis present

## 2018-06-26 LAB — CBC
HCT: 43.7 % (ref 36.0–46.0)
HEMOGLOBIN: 13.7 g/dL (ref 12.0–15.0)
MCH: 27.7 pg (ref 26.0–34.0)
MCHC: 31.4 g/dL (ref 30.0–36.0)
MCV: 88.5 fL (ref 78.0–100.0)
Platelets: 447 10*3/uL — ABNORMAL HIGH (ref 150–400)
RBC: 4.94 MIL/uL (ref 3.87–5.11)
RDW: 14.8 % (ref 11.5–15.5)
WBC: 10.9 10*3/uL — AB (ref 4.0–10.5)

## 2018-06-26 LAB — PROTIME-INR
INR: 0.98
PROTHROMBIN TIME: 12.9 s (ref 11.4–15.2)

## 2018-06-26 MED ORDER — FENTANYL CITRATE (PF) 100 MCG/2ML IJ SOLN
INTRAMUSCULAR | Status: AC | PRN
  Administered 2018-06-26: 50 ug via INTRAVENOUS

## 2018-06-26 MED ORDER — MIDAZOLAM HCL 2 MG/2ML IJ SOLN
INTRAMUSCULAR | Status: AC | PRN
  Administered 2018-06-26: 1 mg via INTRAVENOUS
  Administered 2018-06-26: 0.5 mg via INTRAVENOUS

## 2018-06-26 MED ORDER — LIDOCAINE HCL (PF) 1 % IJ SOLN
INTRAMUSCULAR | Status: AC
  Filled 2018-06-26: qty 30

## 2018-06-26 MED ORDER — MIDAZOLAM HCL 2 MG/2ML IJ SOLN
INTRAMUSCULAR | Status: AC
  Filled 2018-06-26: qty 2

## 2018-06-26 MED ORDER — FENTANYL CITRATE (PF) 100 MCG/2ML IJ SOLN
INTRAMUSCULAR | Status: AC
  Filled 2018-06-26: qty 2

## 2018-06-26 MED ORDER — SODIUM CHLORIDE 0.9 % IV SOLN: INTRAVENOUS | Status: DC

## 2018-06-26 NOTE — H&P (Signed)
Chief Complaint: Patient was seen in consultation today for right submandibular mass biopsy at the request of Teoh,Su  Referring Physician(s): Teoh,Su  Supervising Physician: Marybelle Killings  Patient Status: [facility] - Out-pt  History of Present Illness: Theresa Vincent is a 67 y.o. female   Noticed enlarged "node" 2 months ago + smoker Denies pain Denies enlargement  [place] perfomed at [facility] does reveal mass Was referred to Dr Benjamine Mola Request for biopsy  Reviewed and approved   Past Medical History:  Diagnosis Date  . Arthritis    in back   . Diverticula of colon   . Diverticulosis   . Hemorrhoid   . Thyroid disease    was hyperthyroid had frozen and now on meds    Past Surgical History:  Procedure Laterality Date  . Broken toe screw in toe    . EYE SURGERY  03/20/16  . pt had her bladder stretched in the past     Dr. Michela Pitcher   . radiation thyroid    . WRIST SURGERY Right     Allergies: Avelox [moxifloxacin hcl in nacl]  Medications: Prior to Admission medications   Medication Sig Start Date End Date Taking? Authorizing Provider  acetaminophen (TYLENOL) 500 MG tablet Take 500 mg by mouth daily as needed for moderate pain or headache.   Yes [provider]  Calcium Carbonate-Vitamin D (CALCIUM 500 + D PO) Take 1 tablet by mouth daily.   Yes [provider]  Carboxymethylcellul-Glycerin (LUBRICATING EYE DROPS OP) Place 1 drop into both ears daily as needed (dry eyes).   Yes [provider]  levothyroxine (SYNTHROID, LEVOTHROID) 25 MCG tablet Take 1 tablet (25 mcg total) by mouth daily before breakfast. 06/14/18  Yes Nida, Marella Chimes, MD  Multiple Vitamin (MULTIVITAMIN WITH MINERALS) TABS tablet Take 1 tablet by mouth daily.   Yes [provider]  pseudoephedrine-acetaminophen (TYLENOL SINUS) 30-500 MG TABS tablet Take 1 tablet by mouth daily as needed (allergies).   Yes [provider]  metroNIDAZOLE (METROGEL)  0.75 % vaginal gel Place 1 Applicatorful vaginally at bedtime. Patient not taking: Reported on 04/23/2018 04/08/18   Estill Dooms, NP     Family History  Problem Relation Age of Onset  . Diabetes Mother   . Heart disease Father   . Diabetes Father   . Diabetes Sister   . Diabetes Sister   . Cancer Sister        breast    Social History   Socioeconomic History  . Marital status: Legally Separated    Spouse name: Not on file  . Number of children: Not on file  . Years of education: hs   . Highest education level: Not on file  Occupational History    Employer: [employer]  . Financial resource strain: Not on file  . Food insecurity:    Worry: Not on file    Inability: Not on file  . Transportation needs:    Medical: Not on file    Non-medical: Not on file  Tobacco Use  . Smoking status: Current Every Day Smoker    Packs/day: 0.00    Years: 45.00    Pack years: 0.00    Types: Cigarettes  . Smokeless tobacco: Never Used  . Tobacco comment: smokes 3 cig daily  Substance and Sexual Activity  . Alcohol use: Yes    Comment: rarely   . Drug use: No  . Sexual activity: Not Currently  Birth control/protection: Post-menopausal  Lifestyle  . Physical activity:    Days per week: Not on file    Minutes per session: Not on file  . Stress: Not on file  Relationships  . Social connections:    Talks on phone: Not on file    Gets together: Not on file    Attends religious service: Not on file    Active member of club or organization: Not on file    Attends meetings of clubs or organizations: Not on file    Relationship status: Not on file  Other Topics Concern  . Not on file  Social History Narrative  . Not on file    Review of Systems: A 12 point ROS discussed and pertinent positives are indicated in the HPI above.  All other systems are negative.  Review of Systems  Constitutional: Negative for activity change, fatigue and fever.    Respiratory: Negative for cough and shortness of breath.   Cardiovascular: Negative for chest pain.  Gastrointestinal: Negative for abdominal pain.  Psychiatric/Behavioral: Negative for behavioral problems and confusion.    Vital Signs: BP 117/71   Pulse 66   Temp 99.1 F (37.3 C)   Resp 18   Ht 5\' 2"  (1.575 m)   Wt 148 lb (67.1 kg)   SpO2 98%   BMI 27.07 kg/m   Physical Exam  Constitutional: She is oriented to person, place, and time.  Neck:  Palpable 2 x 2 cm nodule  Rt submandibular area NT  Cardiovascular: Normal rate and regular rhythm.  Pulmonary/Chest: Effort normal and breath sounds normal.  Abdominal: Soft. Bowel sounds are normal.  Musculoskeletal: Normal range of motion.  Neurological: She is alert and oriented to person, place, and time.  Skin: Skin is warm and dry.  Psychiatric: She has a normal mood and affect. Her behavior is normal. Judgment and thought content normal.  Nursing note and vitals reviewed.   Imaging: No results found.  Labs:  CBC: No results for input(s): WBC, HGB, HCT, PLT in the last 8760 hours.  COAGS: No results for input(s): INR, APTT in the last 8760 hours.  BMP: No results for input(s): NA, K, CL, CO2, GLUCOSE, BUN, CALCIUM, CREATININE, GFRNONAA, GFRAA in the last 8760 hours.  Invalid input(s): CMP  LIVER FUNCTION TESTS: No results for input(s): BILITOT, AST, ALT, ALKPHOS, PROT, ALBUMIN in the last 8760 hours.  TUMOR MARKERS: No results for input(s): AFPTM, CEA, CA199, CHROMGRNA in the last 8760 hours.  Assessment and Plan:  Right submandibular nodule Scheduled for biopsy ++smoker  Risks and benefits discussed with the patient including, but not limited to bleeding, infection, damage to adjacent structures or low yield requiring additional tests.  All of the patient's questions were answered, patient is agreeable to proceed. Consent signed and in chart.   Thank you for this interesting consult.  I greatly  enjoyed meeting LAIKYNN POLLIO and look forward to participating in their care.  A copy of this report was sent to the requesting provider on this date.  Electronically Signed: Lavonia Drafts, PA-C 06/26/2018, 12:31 PM   I spent a total of  30 Minutes   in face to face in clinical consultation, greater than 50% of which was counseling/coordinating care for rt submandibular mass bx

## 2018-06-26 NOTE — Procedures (Signed)
R submandibular LN core Bx 18 g times three EBL 0 Comp 0

## 2018-06-26 NOTE — Discharge Instructions (Signed)
Needle Biopsy, Care After These instructions give you information about caring for yourself after your procedure. Your doctor may also give you more specific instructions. Call your doctor if you have any problems or questions after your procedure. Follow these instructions at home:  Rest as told by your doctor.  Take medicines only as told by your doctor.  There are many different ways to close and cover the biopsy site, including stitches (sutures), skin glue, and adhesive strips. Follow instructions from your doctor about: ? How to take care of your biopsy site. ? When and how you should change your bandage (dressing). ? When you should remove your dressing. ? Removing whatever was used to close your biopsy site.  Check your biopsy site every day for signs of infection. Watch for: ? Redness, swelling, or pain. ? Fluid, blood, or pus. Contact a doctor if:  You have a fever.  You have redness, swelling, or pain at the biopsy site, and it lasts longer than a few days.  You have fluid, blood, or pus coming from the biopsy site.  You feel sick to your stomach (nauseous).  You throw up (vomit). Get help right away if:  You are short of breath.  You have trouble breathing.  Your chest hurts.  You feel dizzy or you pass out (faint).  You have bleeding that does not stop with pressure or a bandage.  You cough up blood.  Your belly (abdomen) hurts. This information is not intended to replace advice given to you by your health care provider. Make sure you discuss any questions you have with your health care provider. Document Released: 11/23/2008 Document Revised: 05/18/2016 Document Reviewed: 12/07/2014 Elsevier Interactive Patient Education  [employer].

## 2018-07-02 ENCOUNTER — Other Ambulatory Visit (INDEPENDENT_AMBULATORY_CARE_PROVIDER_SITE_OTHER): Payer: Self-pay | Admitting: Otolaryngology

## 2018-07-02 DIAGNOSIS — C801 Malignant (primary) neoplasm, unspecified: Principal | ICD-10-CM

## 2018-07-02 DIAGNOSIS — C7800 Secondary malignant neoplasm of unspecified lung: Secondary | ICD-10-CM

## 2018-07-04 ENCOUNTER — Emergency Department (HOSPITAL_COMMUNITY): Payer: Medicare Other

## 2018-07-04 ENCOUNTER — Encounter (HOSPITAL_COMMUNITY): Payer: Self-pay | Admitting: *Deleted

## 2018-07-04 ENCOUNTER — Other Ambulatory Visit: Payer: Self-pay

## 2018-07-04 ENCOUNTER — Emergency Department (HOSPITAL_COMMUNITY)
Admission: EM | Admit: 2018-07-04 | Discharge: 2018-07-04 | Disposition: A | Discharge status: Home / Self Care | Payer: Medicare Other | Attending: Emergency Medicine | Admitting: Emergency Medicine

## 2018-07-04 DIAGNOSIS — K5900 Constipation, unspecified: Secondary | ICD-10-CM

## 2018-07-04 DIAGNOSIS — Z87891 Personal history of nicotine dependence: Secondary | ICD-10-CM | POA: Diagnosis not present

## 2018-07-04 DIAGNOSIS — Z79899 Other long term (current) drug therapy: Secondary | ICD-10-CM | POA: Diagnosis not present

## 2018-07-04 DIAGNOSIS — R1110 Vomiting, unspecified: Secondary | ICD-10-CM | POA: Diagnosis not present

## 2018-07-04 DIAGNOSIS — K5641 Fecal impaction: Secondary | ICD-10-CM

## 2018-07-04 LAB — CBC WITH DIFFERENTIAL/PLATELET
Basophils Absolute: 0 10*3/uL (ref 0.0–0.1)
Basophils Relative: 0 %
EOS ABS: 0 10*3/uL (ref 0.0–0.7)
EOS PCT: 0 %
HCT: 46.2 % — ABNORMAL HIGH (ref 36.0–46.0)
Hemoglobin: 15.3 g/dL — ABNORMAL HIGH (ref 12.0–15.0)
LYMPHS PCT: 12 %
Lymphs Abs: 2 10*3/uL (ref 0.7–4.0)
MCH: 28.7 pg (ref 26.0–34.0)
MCHC: 33.1 g/dL (ref 30.0–36.0)
MCV: 86.5 fL (ref 78.0–100.0)
MONOS PCT: 6 %
Monocytes Absolute: 1.1 10*3/uL — ABNORMAL HIGH (ref 0.1–1.0)
Neutro Abs: 13.9 10*3/uL — ABNORMAL HIGH (ref 1.7–7.7)
Neutrophils Relative %: 82 %
PLATELETS: 428 10*3/uL — AB (ref 150–400)
RBC: 5.34 MIL/uL — ABNORMAL HIGH (ref 3.87–5.11)
RDW: 14.8 % (ref 11.5–15.5)
WBC: 16.9 10*3/uL — ABNORMAL HIGH (ref 4.0–10.5)

## 2018-07-04 LAB — COMPREHENSIVE METABOLIC PANEL
ALK PHOS: 133 U/L — AB (ref 38–126)
ALT: 11 U/L (ref 0–44)
ANION GAP: 12 (ref 5–15)
AST: 23 U/L (ref 15–41)
Albumin: 4.4 g/dL (ref 3.5–5.0)
BILIRUBIN TOTAL: 0.6 mg/dL (ref 0.3–1.2)
BUN: 11 mg/dL (ref 8–23)
CALCIUM: 9.4 mg/dL (ref 8.9–10.3)
CO2: 24 mmol/L (ref 22–32)
Chloride: 102 mmol/L (ref 98–111)
Creatinine, Ser: 0.48 mg/dL (ref 0.44–1.00)
Glucose, Bld: 134 mg/dL — ABNORMAL HIGH (ref 70–99)
Potassium: 3.9 mmol/L (ref 3.5–5.1)
SODIUM: 138 mmol/L (ref 135–145)
TOTAL PROTEIN: 8.3 g/dL — AB (ref 6.5–8.1)

## 2018-07-04 LAB — URINALYSIS, ROUTINE W REFLEX MICROSCOPIC
BILIRUBIN URINE: NEGATIVE
Bacteria, UA: NONE SEEN
GLUCOSE, UA: NEGATIVE mg/dL
HGB URINE DIPSTICK: NEGATIVE
Ketones, ur: NEGATIVE mg/dL
NITRITE: NEGATIVE
PH: 7 (ref 5.0–8.0)
Protein, ur: NEGATIVE mg/dL
SPECIFIC GRAVITY, URINE: 1.011 (ref 1.005–1.030)

## 2018-07-04 LAB — LIPASE, BLOOD: Lipase: 26 U/L (ref 11–51)

## 2018-07-04 LAB — TROPONIN I

## 2018-07-04 MED ORDER — SODIUM CHLORIDE 0.9 % IV BOLUS
1000.0000 mL | Freq: Once | INTRAVENOUS | Status: AC
  Administered 2018-07-04: 1000 mL via INTRAVENOUS

## 2018-07-04 NOTE — ED Triage Notes (Signed)
Pt c/o constipation and vomiting x several days. Pt doesn't normally have any problems with constipation. Denies abdominal pain.

## 2018-07-04 NOTE — Discharge Instructions (Addendum)
Take over the counter laxative (such as miralax, milk of magnesia, or senokot) AND a dulcolax suppository (or enema) today and repeat both tomorrow.  Begin to take over the counter stool softener (colace), as directed on packaging, for the next month.  Continue to take your usual prescriptions as previously directed. Keep your PET scan appointment for 07/15/2018.  Call your regular medical doctor tomorrow to schedule a follow up appointment within the next 3 days.  Return to the Emergency Department immediately if worsening.

## 2018-07-04 NOTE — ED Provider Notes (Signed)
[facility] EMERGENCY DEPARTMENT Provider Note   CSN: [id number] Arrival date & time: 07/04/18  1435     History   Chief Complaint Chief Complaint  Patient presents with  . Emesis  . Constipation    HPI Theresa Vincent is a 67 y.o. female.   Emesis    Constipation    Pt was seen at 1530. Per pt, c/o gradual onset and persistence of constant constipation for the past 3 days. Pt states she had N/V yesterday. Pt states she has taken laxatives, enemas with only "small hard balls" of stool results. Pt states she feels a sensation like "something is in my rectum." Denies black or blood in stools or emesis, no CP/SOB, no abd pain, no back pain, no fevers.   Past Medical History:  Diagnosis Date  . Arthritis    in back   . Diverticula of colon   . Diverticulosis   . Hemorrhoid   . Thyroid disease    was hyperthyroid had frozen and now on meds    Patient Active Problem List   Diagnosis Date Noted  . Screening for colorectal cancer 04/23/2018  . Encounter for gynecological examination with Papanicolaou smear of cervix 04/23/2018  . LLQ pain 04/08/2018  . Vaginal atrophy 04/08/2018  . BV (bacterial vaginosis) 04/08/2018  . Vaginal odor 04/08/2018  . Vaginal discharge 04/08/2018  . Hypothyroidism following radioiodine therapy 01/13/2016  . Prediabetes 01/13/2016  . Fracture of fourth toe, right, closed 06/08/2014  . Wrist fracture 05/02/2011  . FRACTURE, RADIUS, DISTAL 03/06/2011  . BLOOD IN STOOL 07/13/2009  . ARTHRITIS, BACK 07/13/2009    Past Surgical History:  Procedure Laterality Date  . Broken toe screw in toe    . EYE SURGERY  03/20/16  . pt had her bladder stretched in the past     Dr. Michela Pitcher   . radiation thyroid    . WRIST SURGERY Right      OB History    Gravida  1   Para  1   Term  1   Preterm      AB      Living  1     SAB      TAB      Ectopic      Multiple      Live Births  1            Home Medications    Prior to  Admission medications   Medication Sig Start Date End Date Taking? Authorizing Provider  acetaminophen (TYLENOL) 500 MG tablet Take 500 mg by mouth daily as needed for moderate pain or headache.    [provider]  Calcium Carbonate-Vitamin D (CALCIUM 500 + D PO) Take 1 tablet by mouth daily.    [provider]  Carboxymethylcellul-Glycerin (LUBRICATING EYE DROPS OP) Place 1 drop into both ears daily as needed (dry eyes).    [provider]  levothyroxine (SYNTHROID, LEVOTHROID) 25 MCG tablet Take 1 tablet (25 mcg total) by mouth daily before breakfast. 06/14/18   Nida, Marella Chimes, MD  metroNIDAZOLE (METROGEL) 0.75 % vaginal gel Place 1 Applicatorful vaginally at bedtime. Patient not taking: Reported on 04/23/2018 04/08/18   Estill Dooms, NP  Multiple Vitamin (MULTIVITAMIN WITH MINERALS) TABS tablet Take 1 tablet by mouth daily.    [provider]  pseudoephedrine-acetaminophen (TYLENOL SINUS) 30-500 MG TABS tablet Take 1 tablet by mouth daily as needed (allergies).    [provider]    [facility]  History Family History  Problem Relation Age of Onset  . Diabetes Mother   . Heart disease Father   . Diabetes Father   . Diabetes Sister   . Diabetes Sister   . Cancer Sister        breast    Social History Social History   Tobacco Use  . Smoking status: Former Smoker    Packs/day: 0.00    Years: 45.00    Pack years: 0.00  . Smokeless tobacco: Never Used  Substance Use Topics  . Alcohol use: Yes    Comment: rarely   . Drug use: No     Allergies   Avelox [moxifloxacin hcl in nacl]   Review of Systems Review of Systems  Gastrointestinal: Positive for constipation and vomiting.  ROS: Statement: All systems negative except as marked or noted in the HPI; Constitutional: Negative for fever and chills. ; ; Eyes: Negative for eye pain, redness and discharge. ; ; ENMT: Negative for ear pain, hoarseness, nasal congestion, sinus  pressure and sore throat. ; ; Cardiovascular: Negative for chest pain, palpitations, diaphoresis, dyspnea and peripheral edema. ; ; Respiratory: Negative for cough, wheezing and stridor. ; ; Gastrointestinal: +sensation of something in rectum, constipation, N/V yesterday.  Negative for diarrhea, abdominal pain, blood in stool, hematemesis, jaundice and rectal bleeding. . ; ; Genitourinary: Negative for dysuria, flank pain and hematuria. ; ; Musculoskeletal: Negative for back pain and neck pain. Negative for swelling and trauma.; ; Skin: Negative for pruritus, rash, abrasions, blisters, bruising and skin lesion.; ; Neuro: Negative for headache, lightheadedness and neck stiffness. Negative for weakness, altered level of consciousness, altered mental status, extremity weakness, paresthesias, involuntary movement, seizure and syncope.      Physical Exam Updated Vital Signs BP (!) 143/89 (BP Location: Right Arm)   Pulse 97   Temp 98.3 F (36.8 C) (Oral)   Resp 16   Ht 5\' 2"  (1.575 m)   Wt 67.1 kg (148 lb)   SpO2 96%   BMI 27.07 kg/m   Physical Exam 1535: Physical examination:  Nursing notes reviewed; Vital signs and O2 SAT reviewed;  Constitutional: Well developed, Well nourished, Well hydrated, In no acute distress; Head:  Normocephalic, atraumatic; Eyes: EOMI, PERRL, No scleral icterus; ENMT: Mouth and pharynx normal, Mucous membranes moist; Neck: Supple, Full range of motion, No lymphadenopathy; Cardiovascular: Regular rate and rhythm, No gallop; Respiratory: Breath sounds clear & equal bilaterally, No wheezes.  Speaking full sentences with ease, Normal respiratory effort/excursion; Chest: Nontender, Movement normal; Abdomen: Soft, Nontender, Nondistended, Normal bowel sounds.  Rectal exam performed w/permission of pt and ED RN chaperone present.  Anal tone normal.  Non-tender, fecal impaction of large amount of soft brown stool in rectal vault.  No fissures, no external hemorrhoids, no palp  masses.;; Genitourinary: No CVA tenderness; Extremities: Peripheral pulses normal, No tenderness, No edema, No calf edema or asymmetry.; Neuro: AA&Ox3, Major CN grossly intact.  Speech clear. No gross focal motor or sensory deficits in extremities. Climbs on and off stretcher easily by herself. Gait steady.; Skin: Color normal, Warm, Dry.   ED Treatments / Results  Labs (all labs ordered are listed, but only abnormal results are displayed)   EKG EKG Interpretation  Date/Time:  Thursday July 04 2018 16:54:04 EDT Ventricular Rate:  77 PR Interval:    QRS Duration: 89 QT Interval:  380 QTC Calculation: 430 R Axis:   75 Text Interpretation:  Sinus rhythm Baseline wander No old tracing to compare Confirmed by Thurnell Garbe,  Nunzio Cory [phone]) on 07/04/2018 4:58:14 PM   Radiology   Procedures Procedures (including critical care time)  Medications Ordered in ED Medications - No data to display   Initial Impression / Assessment and Plan / ED Course  I have reviewed the triage vital signs and the nursing notes.  Pertinent labs & imaging results that were available during my care of the patient were reviewed by me and considered in my medical decision making (see chart for details).  MDM Reviewed: previous chart, nursing note and vitals Reviewed previous: labs and ECG Interpretation: labs, ECG and x-ray   Results for orders placed or performed during the hospital encounter of 07/04/18  Lipase, blood  Result Value Ref Range   Lipase 26 11 - 51 U/L  Comprehensive metabolic panel  Result Value Ref Range   Sodium 138 135 - 145 mmol/L   Potassium 3.9 3.5 - 5.1 mmol/L   Chloride 102 98 - 111 mmol/L   CO2 24 22 - 32 mmol/L   Glucose, Bld 134 (H) 70 - 99 mg/dL   BUN 11 8 - 23 mg/dL   Creatinine, Ser 0.48 0.44 - 1.00 mg/dL   Calcium 9.4 8.9 - 10.3 mg/dL   Total Protein 8.3 (H) 6.5 - 8.1 g/dL   Albumin 4.4 3.5 - 5.0 g/dL   AST 23 15 - 41 U/L   ALT 11 0 - 44 U/L   Alkaline Phosphatase  133 (H) 38 - 126 U/L   Total Bilirubin 0.6 0.3 - 1.2 mg/dL   GFR calc non Af Amer >60 >60 mL/min   GFR calc Af Amer >60 >60 mL/min   Anion gap 12 5 - 15  Urinalysis, Routine w reflex microscopic  Result Value Ref Range   Color, Urine YELLOW YELLOW   APPearance HAZY (A) CLEAR   Specific Gravity, Urine 1.011 1.005 - 1.030   pH 7.0 5.0 - 8.0   Glucose, UA NEGATIVE NEGATIVE mg/dL   Hgb urine dipstick NEGATIVE NEGATIVE   Bilirubin Urine NEGATIVE NEGATIVE   Ketones, ur NEGATIVE NEGATIVE mg/dL   Protein, ur NEGATIVE NEGATIVE mg/dL   Nitrite NEGATIVE NEGATIVE   Leukocytes, UA SMALL (A) NEGATIVE   RBC / HPF 0-5 0 - 5 RBC/hpf   WBC, UA 0-5 0 - 5 WBC/hpf   Bacteria, UA NONE SEEN NONE SEEN   Squamous Epithelial / LPF 0-5 0 - 5   Budding Yeast PRESENT   CBC with Differential/Platelet  Result Value Ref Range   WBC 16.9 (H) 4.0 - 10.5 K/uL   RBC 5.34 (H) 3.87 - 5.11 MIL/uL   Hemoglobin 15.3 (H) 12.0 - 15.0 g/dL   HCT 46.2 (H) 36.0 - 46.0 %   MCV 86.5 78.0 - 100.0 fL   MCH 28.7 26.0 - 34.0 pg   MCHC 33.1 30.0 - 36.0 g/dL   RDW 14.8 11.5 - 15.5 %   Platelets 428 (H) 150 - 400 K/uL   Neutrophils Relative % 82 %   Neutro Abs 13.9 (H) 1.7 - 7.7 K/uL   Lymphocytes Relative 12 %   Lymphs Abs 2.0 0.7 - 4.0 K/uL   Monocytes Relative 6 %   Monocytes Absolute 1.1 (H) 0.1 - 1.0 K/uL   Eosinophils Relative 0 %   Eosinophils Absolute 0.0 0.0 - 0.7 K/uL   Basophils Relative 0 %   Basophils Absolute 0.0 0.0 - 0.1 K/uL  Troponin I  Result Value Ref Range   Troponin I <0.03 <0.03 ng/mL   Ct Abdomen Pelvis Wo  Contrast Result Date: 07/04/2018 CLINICAL DATA:  Constipation and vomiting for several days EXAM: CT ABDOMEN AND PELVIS WITHOUT CONTRAST TECHNIQUE: Multidetector CT imaging of the abdomen and pelvis was performed following the standard protocol without IV contrast. COMPARISON:  CT abdomen pelvis of 04/23/2006 FINDINGS: Lower chest: A small nodular opacity within the right lower lobe on image 4  series 4 is stable compared to the prior CT and therefore considered benign. No pneumonia or effusion is seen. The heart is within normal limits in size. There is a small hiatal hernia noted. Hepatobiliary: The liver is unremarkable in the unenhanced state. No focal hepatic lesion is seen. No calcified gallstones are noted. Pancreas: The pancreas is normal in size. The pancreatic duct is not dilated. Spleen: The spleen is unremarkable. Adrenals/Urinary Tract: The adrenal glands appear normal. A 6 mm right renal calculus appears unchanged. No hydronephrosis is seen. The ureters are normal in caliber to the bladder. The urinary bladder is unremarkable. Stomach/Bowel: The stomach is not well distended and a small hiatal hernia is again noted. No small bowel abnormality is seen. There is a large rectal fecal ball present somewhat distending the rectal wall. No edema is evident. The colon is unremarkable. The appendix and terminal ileum appear normal. Vascular/Lymphatic: The abdominal aorta is normal in caliber with moderate abdominal aortic atherosclerosis. No adenopathy is seen. Reproductive: The uterus is normal in size. No adnexal lesion is seen. No fluid is noted within the pelvis. Other: No abdominal wall hernia is seen. Musculoskeletal: The lumbar vertebrae are in normal alignment. There is diffuse degenerative disc disease with some sparing of the L5-S1 level. No compression deformity is seen. IMPRESSION: 1. There is a large fecal ball distending the rectum but no rectal mucosal edema is seen currently. There is only a normal amount of feces throughout the remainder of the colon more proximally. 2. 6 mm nonobstructing right renal calculus is unchanged. No hydronephrosis. 3. Moderate abdominal aortic atherosclerosis. 4. Diffuse degenerative disc disease of the lumbar spine. Electronically Signed   By: Ivar Drape M.D.   On: 07/04/2018 16:59    Dg Chest 2 View Result Date: 07/04/2018 CLINICAL DATA:  Constant  patient and vomiting, generalized abdominal pain for 3 days. EXAM: CHEST - 2 VIEW COMPARISON:  Chest x-ray dated 03/26/2013. FINDINGS: Heart size and mediastinal contours are within normal limits. Coarse lung markings are seen bilaterally suggesting chronic interstitial lung disease. No confluent opacity to suggest a developing pneumonia. No pleural effusion or pneumothorax seen. New ill-defined nodular density is seen within the suprahilar RIGHT lung, versus a confluence of chronic bronchitic changes. No acute or suspicious osseous finding. IMPRESSION: 1. Ill-defined nodular density within the RIGHT suprahilar lung, medial aspect, with a probable correlate on the lateral view projected over the anterior clear space. This could represent neoplastic nodule or a confluence of chronic bronchitic changes/scarring. Recommend chest CT for further characterization. 2. Probable chronic interstitial lung disease. 3. No evidence of pneumonia or pulmonary edema. Electronically Signed   By: Franki Cabot M.D.   On: 07/04/2018 16:56     1545:  Pt with fecal impaction in rectum; attempted to disimpact, but pt unable to tolerate more than just softening the stool that was there, and demanded I stop. Pt ambulated to bathroom and states she passed "just a small stool." Offered to disimpact again and pt refused.  Pt's friend very concerned that "they found she has cancer but they don't know where it is" and pt is due for PET  scan. Pt is insistent she "doesn't get constipated" and "something is wrong."  Attempts to reassure pt, friend, and family were unsuccessful. Continue with heightened anxiety and easy agitation. Will order labs, CT.   1720:  IVF given for heme-concentration. CT scan with fecal ball and pulmonary nodules, otherwise reassuring. Pt already has PET scan scheduled. Pt, friend and family all with very heightened levels of anxiety regarding pt's constipation and all started asking me multiple questions all at the  same time, even asking me more/different questions while I was talking and attempting to answer their previous questions. I attempted to answer all questions as thoroughly as possible given the information I had available to me. Friend insistent that "you need to go up there and go get the stool!" I informed friend that I did attempt to perform fecal disimpaction, but pt demanded I stop because she was unable to tolerate it and I abided by the pt's wishes. Family and friend both said "well, I don't know why you listened to her!" while pt stated "it was really uncomfortable." I again attempted to reassure pt and family regarding her testing today; unsuccessfully. Pt, family and friend continue very anxious; now starting to ask "why did they wait so long to do the PET scan?" and other questions regarding her outpatient cancer workup; I informed them I was not privy to that decision making and could not answer their questions, referring them back to ENT and PMD. Continue anxious and agitated.  1745:  Pt ambulated to bathroom and passed large BM. States she feels "much better now."  1855:  Significant anxiety/agitation of pt, family, and friend has now lessened as pt states she feels better after having a large BM. Udip without infection. Pt states she is ready to go home now. Tx symptomatically at this time. Dx and testing d/w pt and family.  Questions answered.  Verb understanding, agreeable to d/c home with outpt f/u.          Final Clinical Impressions(s) / ED Diagnoses   Final diagnoses:  None    ED Discharge Orders    None       Francine Graven, DO 07/07/18 1726

## 2018-07-05 LAB — URINE CULTURE

## 2018-07-15 ENCOUNTER — Encounter (HOSPITAL_COMMUNITY)
Admission: RE | Admit: 2018-07-15 | Discharge: 2018-07-15 | Disposition: A | Discharge status: Home / Self Care | Payer: Medicare Other | Source: Ambulatory Visit | Attending: Otolaryngology | Admitting: Otolaryngology

## 2018-07-15 DIAGNOSIS — C7800 Secondary malignant neoplasm of unspecified lung: Secondary | ICD-10-CM

## 2018-07-15 DIAGNOSIS — C801 Malignant (primary) neoplasm, unspecified: Secondary | ICD-10-CM | POA: Insufficient documentation

## 2018-07-15 DIAGNOSIS — R911 Solitary pulmonary nodule: Secondary | ICD-10-CM | POA: Diagnosis not present

## 2018-07-15 MED ORDER — FLUDEOXYGLUCOSE F - 18 (FDG) INJECTION
9.3400 | Freq: Once | INTRAVENOUS | Status: AC | PRN
  Administered 2018-07-15: 9.34 via INTRAVENOUS

## 2018-07-16 ENCOUNTER — Encounter (HOSPITAL_COMMUNITY): Payer: Self-pay | Admitting: Hematology

## 2018-07-16 ENCOUNTER — Other Ambulatory Visit: Payer: Self-pay

## 2018-07-16 ENCOUNTER — Inpatient Hospital Stay (HOSPITAL_COMMUNITY): Payer: Medicare Other | Attending: Hematology | Admitting: Hematology

## 2018-07-16 VITALS — BP 128/57 | HR 78 | Temp 98.1°F | Resp 18 | Ht 62.0 in | Wt 141.8 lb

## 2018-07-16 DIAGNOSIS — Z87891 Personal history of nicotine dependence: Secondary | ICD-10-CM | POA: Insufficient documentation

## 2018-07-16 DIAGNOSIS — R42 Dizziness and giddiness: Secondary | ICD-10-CM | POA: Diagnosis not present

## 2018-07-16 DIAGNOSIS — R911 Solitary pulmonary nodule: Secondary | ICD-10-CM

## 2018-07-16 DIAGNOSIS — S060X0A Concussion without loss of consciousness, initial encounter: Secondary | ICD-10-CM

## 2018-07-16 DIAGNOSIS — C770 Secondary and unspecified malignant neoplasm of lymph nodes of head, face and neck: Secondary | ICD-10-CM | POA: Diagnosis not present

## 2018-07-16 DIAGNOSIS — C3491 Malignant neoplasm of unspecified part of right bronchus or lung: Secondary | ICD-10-CM

## 2018-07-16 DIAGNOSIS — Z803 Family history of malignant neoplasm of breast: Secondary | ICD-10-CM | POA: Diagnosis not present

## 2018-07-16 DIAGNOSIS — Z9181 History of falling: Secondary | ICD-10-CM | POA: Diagnosis not present

## 2018-07-16 DIAGNOSIS — R112 Nausea with vomiting, unspecified: Secondary | ICD-10-CM | POA: Diagnosis not present

## 2018-07-16 NOTE — Progress Notes (Signed)
[facility] CONSULT NOTE  Patient Care Team: Golding, John, MD as PCP - General (Family Medicine)  CHIEF COMPLAINTS/PURPOSE OF CONSULTATION:  Newly diagnosed lung cancer.  HISTORY OF PRESENTING ILLNESS:  Theresa Vincent 66 y.o. female is seen in consultation today for further work-up and management of newly diagnosed lung cancer.  She has noticed a lump on the right side of the neck for 2 to 3 months.  She was initially treated with antibiotics.  As it did not decrease in size, she was evaluated by Dr. Teoh who did flexible laryngoscopy.  No lesions were found.  She was sent to interventional radiology for biopsy of her right submandibular lymph node which was done on 06/26/2018.  This was consistent with metastatic poorly differentiated adenocarcinoma.  Patient is a current active smoker, quit 2 weeks ago, smoked half pack per day for more than 25 years.  Denies any cough or hemoptysis.  A PET scan was subsequently ordered and done.  Patient reportedly fell 2 weeks ago.  Since then she has been feeling dizzy and has mild tenderness in the occipital region.  She also has nausea and vomited mostly after eating.  She was using Zofran which does not help all the time.  Lately her gait has been unsteady and she has to hold onto things while walking.  She lives at home with her daughter.  She is currently retired.  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  No work-related asbestos exposure. 2 sisters have breast cancer, one at age 48 and had triple negative breast cancer.  Maternal aunt also had breast cancer at age 48.  MEDICAL HISTORY:  Past Medical History:  Diagnosis Date  . Arthritis    in back   . Diverticula of colon   . Diverticulosis   . Hemorrhoid   . Thyroid disease    was hyperthyroid had frozen and now on meds    SURGICAL HISTORY: Past Surgical History:  Procedure Laterality Date  . Broken toe  screw in toe    . EYE SURGERY  03/20/16  . pt had her bladder stretched in the past     Dr. Javaid   . radiation thyroid    . WRIST SURGERY Right     SOCIAL HISTORY: Social History   Socioeconomic History  . Marital status: Legally Separated    Spouse name: Not on file  . Number of children: Not on file  . Years of education: hs   . Highest education level: Not on file  Occupational History    Employer: [employer]  Social Needs  . Financial resource strain: Not on file  . Food insecurity:    Worry: Not on file    Inability: Not on file  . Transportation needs:    Medical: Not on file    Non-medical: Not on file  Tobacco Use  . Smoking status: Former Smoker    Packs/day: 0.00    Years: 45.00    Pack years: 0.00  . Smokeless tobacco: Never Used  Substance and Sexual Activity  . Alcohol use: Yes    Comment: rarely   . Drug use: No  . Sexual activity: Not Currently    Birth control/protection: Post-menopausal  Lifestyle  . Physical activity:    Days per week: Not on file    Minutes per session: Not on file  . Stress: Not on file  Relationships  .   Social connections:    Talks on phone: Not on file    Gets together: Not on file    Attends religious service: Not on file    Active member of club or organization: Not on file    Attends meetings of clubs or organizations: Not on file    Relationship status: Not on file  . Intimate partner violence:    Fear of current or ex partner: Not on file    Emotionally abused: Not on file    Physically abused: Not on file    Forced sexual activity: Not on file  Other Topics Concern  . Not on file  Social History Narrative  . Not on file    FAMILY HISTORY: Family History  Problem Relation Age of Onset  . Diabetes Mother   . Heart disease Father   . Diabetes Father   . Diabetes Sister   . Diabetes Sister   . Cancer Sister        breast    ALLERGIES:  is allergic to avelox [moxifloxacin hcl in  nacl].  MEDICATIONS:  Current Outpatient Medications  Medication Sig Dispense Refill  . acetaminophen (TYLENOL) 500 MG tablet Take 500 mg by mouth daily as needed for moderate pain or headache.    . Calcium Carbonate-Vitamin D (CALCIUM 500 + D PO) Take 1 tablet by mouth daily.    . levothyroxine (SYNTHROID, LEVOTHROID) 25 MCG tablet Take 1 tablet (25 mcg total) by mouth daily before breakfast. 90 tablet 0  . Multiple Vitamin (MULTIVITAMIN WITH MINERALS) TABS tablet Take 1 tablet by mouth daily.    . pseudoephedrine-acetaminophen (TYLENOL SINUS) 30-500 MG TABS tablet Take 1 tablet by mouth daily as needed (allergies).     No current facility-administered medications for this visit.     REVIEW OF SYSTEMS:   Constitutional: Denies fevers, chills or abnormal night sweats.  Fatigue positive.  Dizziness positive. Eyes: Denies blurriness of vision, double vision or watery eyes Ears, nose, mouth, throat, and face: Denies mucositis or sore throat Respiratory: Denies cough, dyspnea or wheezes Cardiovascular: Denies palpitation, chest discomfort or lower extremity swelling Gastrointestinal: Positive for constipation.  Nausea and vomiting present. Skin: Denies abnormal skin rashes Lymphatics: Denies new lymphadenopathy or easy bruising Neurological:Denies numbness, tingling or new weaknesses Behavioral/Psych: Mood is stable, no new changes  All other systems were reviewed with the patient and are negative.  PHYSICAL EXAMINATION: ECOG PERFORMANCE STATUS: 1 - Symptomatic but completely ambulatory  Vitals:   07/16/18 1129  BP: (!) 128/57  Pulse: 78  Resp: 18  Temp: 98.1 F (36.7 C)  SpO2: 98%   Filed Weights   07/16/18 1129  Weight: 141 lb 12.8 oz (64.3 kg)    GENERAL:alert, no distress and comfortable SKIN: skin color, texture, turgor are normal, no rashes or significant lesions EYES: normal, conjunctiva are pink and non-injected, sclera clear OROPHARYNX:no exudate, no erythema and  lips, buccal mucosa, and tongue normal  NECK: supple, thyroid normal size, non-tender, without nodularity LYMPH:  no palpable lymphadenopathy in the cervical, axillary or inguinal LUNGS: clear to auscultation and percussion with normal breathing effort HEART: regular rate & rhythm and no murmurs and no lower extremity edema ABDOMEN:abdomen soft, non-tender and normal bowel sounds PSYCH: alert & oriented x 3 with fluent speech   LABORATORY DATA:  I have reviewed the data as listed Lab Results  Component Value Date   WBC 16.9 (H) 07/04/2018   HGB 15.3 (H) 07/04/2018   HCT 46.2 (H) 07/04/2018     MCV 86.5 07/04/2018   PLT 428 (H) 07/04/2018     Chemistry      Component Value Date/Time   NA 138 07/04/2018 1549   K 3.9 07/04/2018 1549   CL 102 07/04/2018 1549   CO2 24 07/04/2018 1549   BUN 11 07/04/2018 1549   CREATININE 0.48 07/04/2018 1549      Component Value Date/Time   CALCIUM 9.4 07/04/2018 1549   ALKPHOS 133 (H) 07/04/2018 1549   AST 23 07/04/2018 1549   ALT 11 07/04/2018 1549   BILITOT 0.6 07/04/2018 1549       RADIOGRAPHIC STUDIES: I have personally reviewed the radiological images as listed and agreed with the findings in the report. Ct Abdomen Pelvis Wo Contrast  Result Date: 07/04/2018 CLINICAL DATA:  Constipation and vomiting for several days EXAM: CT ABDOMEN AND PELVIS WITHOUT CONTRAST TECHNIQUE: Multidetector CT imaging of the abdomen and pelvis was performed following the standard protocol without IV contrast. COMPARISON:  CT abdomen pelvis of 04/23/2006 FINDINGS: Lower chest: A small nodular opacity within the right lower lobe on image 4 series 4 is stable compared to the prior CT and therefore considered benign. No pneumonia or effusion is seen. The heart is within normal limits in size. There is a small hiatal hernia noted. Hepatobiliary: The liver is unremarkable in the unenhanced state. No focal hepatic lesion is seen. No calcified gallstones are noted.  Pancreas: The pancreas is normal in size. The pancreatic duct is not dilated. Spleen: The spleen is unremarkable. Adrenals/Urinary Tract: The adrenal glands appear normal. A 6 mm right renal calculus appears unchanged. No hydronephrosis is seen. The ureters are normal in caliber to the bladder. The urinary bladder is unremarkable. Stomach/Bowel: The stomach is not well distended and a small hiatal hernia is again noted. No small bowel abnormality is seen. There is a large rectal fecal ball present somewhat distending the rectal wall. No edema is evident. The colon is unremarkable. The appendix and terminal ileum appear normal. Vascular/Lymphatic: The abdominal aorta is normal in caliber with moderate abdominal aortic atherosclerosis. No adenopathy is seen. Reproductive: The uterus is normal in size. No adnexal lesion is seen. No fluid is noted within the pelvis. Other: No abdominal wall hernia is seen. Musculoskeletal: The lumbar vertebrae are in normal alignment. There is diffuse degenerative disc disease with some sparing of the L5-S1 level. No compression deformity is seen. IMPRESSION: 1. There is a large fecal ball distending the rectum but no rectal mucosal edema is seen currently. There is only a normal amount of feces throughout the remainder of the colon more proximally. 2. 6 mm nonobstructing right renal calculus is unchanged. No hydronephrosis. 3. Moderate abdominal aortic atherosclerosis. 4. Diffuse degenerative disc disease of the lumbar spine. Electronically Signed   By: Ivar Drape M.D.   On: 07/04/2018 16:59   Dg Chest 2 View  Result Date: 07/04/2018 CLINICAL DATA:  Constant patient and vomiting, generalized abdominal pain for 3 days. EXAM: CHEST - 2 VIEW COMPARISON:  Chest x-ray dated 03/26/2013. FINDINGS: Heart size and mediastinal contours are within normal limits. Coarse lung markings are seen bilaterally suggesting chronic interstitial lung disease. No confluent opacity to suggest a  developing pneumonia. No pleural effusion or pneumothorax seen. New ill-defined nodular density is seen within the suprahilar RIGHT lung, versus a confluence of chronic bronchitic changes. No acute or suspicious osseous finding. IMPRESSION: 1. Ill-defined nodular density within the RIGHT suprahilar lung, medial aspect, with a probable correlate on the lateral view projected  over the anterior clear space. This could represent neoplastic nodule or a confluence of chronic bronchitic changes/scarring. Recommend chest CT for further characterization. 2. Probable chronic interstitial lung disease. 3. No evidence of pneumonia or pulmonary edema. Electronically Signed   By: Stan  Maynard M.D.   On: 07/04/2018 16:56   Nm Pet Image Initial (pi) Skull Base To Thigh  Result Date: 07/16/2018 CLINICAL DATA:  Initial treatment strategy for right lung nodule and right neck mass. EXAM: NUCLEAR MEDICINE PET SKULL BASE TO THIGH TECHNIQUE: 9.3 mCi F-18 FDG was injected intravenously. Full-ring PET imaging was performed from the skull base to thigh after the radiotracer. CT data was obtained and used for attenuation correction and anatomic localization. Fasting blood glucose: 116 mg/dl COMPARISON:  None. FINDINGS: (Reference/background mediastinal blood pool activity: SUV max = 2.8) NECK: Right level 1B submandibular lymph node or mass is seen which measures 2.3 cm and has SUV max 12.8. No other hypermetabolic lymph nodes or masses identified within the neck. Incidental CT findings:  None. CHEST: A 1.8 cm spiculated nodule is seen in the anteromedial right upper lobe which has SUV max of 6.8. No other hypermetabolic pulmonary nodules identified Hypermetabolic mediastinal lymphadenopathy is seen in the right paratracheal region which measures 1.8 cm and has SUV max of 8.6. A 10 mm hypermetabolic mediastinal lymph node is also seen along the right lateral wall of the ascending aorta. No hypermetabolic hilar or axillary lymphadenopathy  identified. Incidental CT findings: Pulmonary emphysema. A few right lower lobe sub-cm pulmonary nodules show no FDG uptake but are too small to characterize. Mild emphysema. ABDOMEN/PELVIS: No abnormal hypermetabolic activity within the liver, pancreas, adrenal glands, or spleen. No hypermetabolic lymph nodes in the abdomen or pelvis. Incidental CT findings: Small hiatal hernia. Nonobstructing right renal calculus. Aortic atherosclerosis. SKELETON: No focal hypermetabolic bone lesions to suggest skeletal metastasis. Incidental CT findings:  None. IMPRESSION: Hypermetabolic 1.8 cm right upper lobe pulmonary nodule, suspicious for primary bronchogenic carcinoma. Ipsilateral hypermetabolic mediastinal lymphadenopathy, consistent with metastatic disease. 2.3 cm hypermetabolic right submandibular lymph node or mass, likely due to metastatic disease. No evidence of metastatic disease within the abdomen or pelvis. Electronically Signed   By: John  Stahl M.D.   On: 07/16/2018 10:40   Us Core Biopsy (lymph Nodes)  Result Date: 06/26/2018 INDICATION: Enlarged right submandibular lymph node EXAM: ULTRASOUND GUIDED CORE BIOPSY OF RIGHT SUBMANDIBULAR LYMPH NODE MEDICATIONS: None. ANESTHESIA/SEDATION: Fentanyl 50 mcg IV; Versed 1.5 mg IV Moderate Sedation Time:  10 minutes The patient was continuously monitored during the procedure by the interventional radiology nurse under my direct supervision. PROCEDURE: The procedure, risks, benefits, and alternatives were explained to the patient. Questions regarding the procedure were encouraged and answered. The patient understands and consents to the procedure. The right neck was prepped with ChloraPrep in a sterile fashion, and a sterile drape was applied covering the operative field. A sterile gown and sterile gloves were used for the procedure. Local anesthesia was provided with 1% Lidocaine. Under sonographic guidance, 3 18 gauge core biopsies of the enlarged right submandibular  lymph node were obtained. COMPLICATIONS: None immediate. FINDINGS: Images document needle placement in the right submandibular lymph node. IMPRESSION: Successful ultrasound-guided core biopsy of a enlarged right submandibular lymph node. Electronically Signed   By: Arthur  Hoss M.D.   On: 06/26/2018 15:57    ASSESSMENT & PLAN:  Non-small cell lung cancer, right (HCC) 1.  Stage IVa (T1BN2M1B) poorly differentiated adenocarcinoma of the right lung: - Presentation with the 2 to   3-month history of right submandibular mass, seen by Dr. Teoh.  Flexible fiberoptic laryngoscopy showed normal examination. - She underwent right submandibular lymph node biopsy on 06/26/2018 which showed metastatic poorly differentiated adenocarcinoma, TTF-1 positive, CK positive. - Patient is a current active smoker, quit 2 weeks ago, smoked about half pack per day for more than 25 years. -PET CT scan on 07/15/2018 shows hypermetabolic 1.8 cm right upper lobe pulmonary nodule, suspicious for primary bronchus and carcinoma, ipsilateral hypermetabolic mediastinal adenopathy consistent with metastatic disease, 2.3 cm hypermetabolic right submandibular lymph node. - Patient reportedly fell 2 weeks ago and since then has been feeling dizzy and unsteady.  Also has occasional nausea and vomited for the last 2 weeks mostly after eating.  Taking Zofran which occasionally helps. -I have recommended MRI of the brain with and without gadolinium to complete the work-up. -She can be considered as having although metastatic disease as she has only one lymph node in the right submandibular region outside the radiation field.  If she does not have brain meta stasis, treatment options include treating it like stage III cancer with combination chemoradiation therapy followed by consolidation with Durvalumab.  We will consider discussing her case with the tumor board.  2.  Family history: -She had one sister who had TNBC at 48.  Another sister had  breast cancer at age 68.  Maternal aunt had breast cancer at age 48.  I have recommended her sister to undergo testing for BRCA 1/2 .  Orders Placed This Encounter  Procedures  . MR Brain W Wo Contrast    Standing Status:   Future    Standing Expiration Date:   07/16/2019    Order Specific Question:   ** REASON FOR EXAM (FREE TEXT)    Answer:   fall with head injury    Order Specific Question:   If indicated for the ordered procedure, I authorize the administration of contrast media per Radiology protocol    Answer:   Yes    Order Specific Question:   What is the patient's sedation requirement?    Answer:   No Sedation    Order Specific Question:   Does the patient have a pacemaker or implanted devices?    Answer:   No    Order Specific Question:   Use SRS Protocol?    Answer:   Yes    Order Specific Question:   Radiology Contrast Protocol - do NOT remove file path    Answer:   [email or web address]    Order Specific Question:   Preferred imaging location?    Answer:   [facility] (table limit-350lbs)  . Protein electrophoresis, serum    Standing Status:   Future    Standing Expiration Date:   07/16/2019    All questions were answered. The patient knows to call the clinic with any problems, questions or concerns.       , MD 07/16/2018 5:52 PM     

## 2018-07-16 NOTE — Patient Instructions (Addendum)
[place] at [facility] Discharge Instructions   Follow up after your MRI of the brain  Today you saw Dr. Raliegh Ip.    Thank you for choosing [place] at [facility] to provide your oncology and hematology care.  To afford each patient quality time with our provider, please arrive at least 15 minutes before your scheduled appointment time.   If you have a lab appointment with the [facility] please come in thru the  Main Entrance and check in at the main information desk  You need to re-schedule your appointment should you arrive 10 or more minutes late.  We strive to give you quality time with our providers, and arriving late affects you and other patients whose appointments are after yours.  Also, if you no show three or more times for appointments you may be dismissed from the clinic at the providers discretion.     Again, thank you for choosing [facility]  Our hope is that these requests will decrease the amount of time that you wait before being seen by our physicians.       _____________________________________________________________  Should you have questions after your visit to [facility], please contact our office at [phone] between the hours of 8:30 a.m. and 4:30 p.m.  Voicemails left after 4:30 p.m. will not be returned until the following business day.  For prescription refill requests, have your pharmacy contact our office.       Resources For Cancer Patients and their Caregivers ? American Cancer Society: Can assist with transportation, wigs, general needs, runs Look Good Feel Better.        437-353-7388 ? Cancer Care: Provides financial assistance, online support groups, medication/co-pay assistance.  1-800-813-HOPE (331)088-1079) ? [place] Assists [place] cancer patients and their families through emotional , educational and financial support.   (601)106-7995 ? [place] DSS Where to apply for food stamps, Medicaid and utility assistance. 509-068-2812 ? [facility]: Transportation to medical appointments. 531-625-1171 ? Social Security Administration: May apply for disability if have a Stage IV cancer. (364) 214-5496 217-814-4528 ? [employer], Disability and Transit Services: Assists with nutrition, care and transit needs. [facility] Support Programs:   > Cancer Support Group  2nd Tuesday of the month 1pm-2pm, Journey Room   > Creative Journey  3rd Tuesday of the month 1130am-1pm, Journey Room

## 2018-07-16 NOTE — Assessment & Plan Note (Addendum)
1.  Stage IVa (T1BN2M1B) poorly differentiated adenocarcinoma of the right lung: - Presentation with the 2 to 51-monthhistory of right submandibular mass, seen by Dr. TBenjamine Mola  Flexible fiberoptic laryngoscopy showed normal examination. - She underwent right submandibular lymph node biopsy on 06/26/2018 which showed metastatic poorly differentiated adenocarcinoma, TTF-1 positive, CK positive. - Patient is a current active smoker, quit 2 weeks ago, smoked about half pack per day for more than 25 years. -PET CT scan on 07/15/2018 shows hypermetabolic 1.8 cm right upper lobe pulmonary nodule, suspicious for primary bronchus and carcinoma, ipsilateral hypermetabolic mediastinal adenopathy consistent with metastatic disease, 2.3 cm hypermetabolic right submandibular lymph node. - Patient reportedly fell 2 weeks ago and since then has been feeling dizzy and unsteady.  Also has occasional nausea and vomited for the last 2 weeks mostly after eating.  Taking Zofran which occasionally helps. -I have recommended MRI of the brain with and without gadolinium to complete the work-up. -She can be considered as having although metastatic disease as she has only one lymph node in the right submandibular region outside the radiation field.  If she does not have brain meta stasis, treatment options include treating it like stage III cancer with combination chemoradiation therapy followed by consolidation with Durvalumab.  We will consider discussing her case with the tumor board.  2.  Family history: -She had one sister who had TNBC at 472  Another sister had breast cancer at age 66  Maternal aunt had breast cancer at age 66  I have recommended her sister to undergo testing for BRCA 1/2 .

## 2018-07-17 ENCOUNTER — Inpatient Hospital Stay (HOSPITAL_COMMUNITY): Payer: Medicare Other

## 2018-07-17 DIAGNOSIS — R911 Solitary pulmonary nodule: Secondary | ICD-10-CM | POA: Diagnosis not present

## 2018-07-17 DIAGNOSIS — R42 Dizziness and giddiness: Secondary | ICD-10-CM | POA: Diagnosis not present

## 2018-07-17 DIAGNOSIS — C3491 Malignant neoplasm of unspecified part of right bronchus or lung: Secondary | ICD-10-CM

## 2018-07-17 DIAGNOSIS — Z9181 History of falling: Secondary | ICD-10-CM | POA: Diagnosis not present

## 2018-07-17 DIAGNOSIS — Z87891 Personal history of nicotine dependence: Secondary | ICD-10-CM | POA: Diagnosis not present

## 2018-07-17 DIAGNOSIS — R112 Nausea with vomiting, unspecified: Secondary | ICD-10-CM | POA: Diagnosis not present

## 2018-07-17 DIAGNOSIS — C770 Secondary and unspecified malignant neoplasm of lymph nodes of head, face and neck: Secondary | ICD-10-CM | POA: Diagnosis not present

## 2018-07-18 LAB — PROTEIN ELECTROPHORESIS, SERUM
A/G Ratio: 0.9 (ref 0.7–1.7)
ALPHA-1-GLOBULIN: 0.3 g/dL (ref 0.0–0.4)
ALPHA-2-GLOBULIN: 1.3 g/dL — AB (ref 0.4–1.0)
Albumin ELP: 3.5 g/dL (ref 2.9–4.4)
Beta Globulin: 1.3 g/dL (ref 0.7–1.3)
Gamma Globulin: 0.8 g/dL (ref 0.4–1.8)
Globulin, Total: 3.7 g/dL (ref 2.2–3.9)
TOTAL PROTEIN ELP: 7.2 g/dL (ref 6.0–8.5)

## 2018-07-24 ENCOUNTER — Other Ambulatory Visit (HOSPITAL_COMMUNITY): Payer: Self-pay | Admitting: *Deleted

## 2018-07-24 ENCOUNTER — Ambulatory Visit (HOSPITAL_COMMUNITY)
Admission: RE | Admit: 2018-07-24 | Discharge: 2018-07-24 | Disposition: A | Discharge status: Home / Self Care | Payer: Medicare Other | Source: Ambulatory Visit | Attending: Nurse Practitioner | Admitting: Nurse Practitioner

## 2018-07-24 DIAGNOSIS — C3491 Malignant neoplasm of unspecified part of right bronchus or lung: Secondary | ICD-10-CM | POA: Diagnosis not present

## 2018-07-24 DIAGNOSIS — C7931 Secondary malignant neoplasm of brain: Secondary | ICD-10-CM | POA: Insufficient documentation

## 2018-07-24 DIAGNOSIS — C801 Malignant (primary) neoplasm, unspecified: Secondary | ICD-10-CM | POA: Diagnosis not present

## 2018-07-24 MED ORDER — DEXAMETHASONE 4 MG PO TABS: 4.0000 mg | ORAL_TABLET | Freq: Four times a day (QID) | ORAL | 1 refills | Status: DC

## 2018-07-24 MED ORDER — GADOBENATE DIMEGLUMINE 529 MG/ML IV SOLN
10.0000 mL | Freq: Once | INTRAVENOUS | Status: AC | PRN
  Administered 2018-07-24: 10 mL via INTRAVENOUS

## 2018-07-24 NOTE — Progress Notes (Signed)
Dr. Delton Coombes gave verbal orders to call in Decadron 4 mg by mouth every 6 hours.   I called and spoke with patient and advised her to start taking medication as soon as she can get it today.  She states that she will send someone to the pharmacy now to pick it up.  I explained to her that it is for swelling.  I told patient that she will get her full detailed results tomorrow at her office visit with Dr. Delton Coombes.  She verbalizes understanding.

## 2018-07-25 ENCOUNTER — Encounter: Payer: Self-pay | Admitting: Radiation Oncology

## 2018-07-25 ENCOUNTER — Inpatient Hospital Stay (HOSPITAL_COMMUNITY): Payer: Medicare Other | Attending: Hematology | Admitting: Hematology

## 2018-07-25 ENCOUNTER — Encounter (HOSPITAL_COMMUNITY): Payer: Self-pay | Admitting: *Deleted

## 2018-07-25 ENCOUNTER — Encounter (HOSPITAL_COMMUNITY): Payer: Self-pay | Admitting: Hematology

## 2018-07-25 ENCOUNTER — Other Ambulatory Visit: Payer: Self-pay

## 2018-07-25 DIAGNOSIS — R112 Nausea with vomiting, unspecified: Secondary | ICD-10-CM | POA: Insufficient documentation

## 2018-07-25 DIAGNOSIS — K5900 Constipation, unspecified: Secondary | ICD-10-CM | POA: Diagnosis not present

## 2018-07-25 DIAGNOSIS — R5383 Other fatigue: Secondary | ICD-10-CM | POA: Diagnosis not present

## 2018-07-25 DIAGNOSIS — C7931 Secondary malignant neoplasm of brain: Secondary | ICD-10-CM

## 2018-07-25 DIAGNOSIS — C770 Secondary and unspecified malignant neoplasm of lymph nodes of head, face and neck: Secondary | ICD-10-CM | POA: Insufficient documentation

## 2018-07-25 DIAGNOSIS — Z9181 History of falling: Secondary | ICD-10-CM | POA: Insufficient documentation

## 2018-07-25 DIAGNOSIS — Z803 Family history of malignant neoplasm of breast: Secondary | ICD-10-CM | POA: Insufficient documentation

## 2018-07-25 DIAGNOSIS — R51 Headache: Secondary | ICD-10-CM | POA: Insufficient documentation

## 2018-07-25 DIAGNOSIS — Z87891 Personal history of nicotine dependence: Secondary | ICD-10-CM | POA: Diagnosis not present

## 2018-07-25 DIAGNOSIS — C3411 Malignant neoplasm of upper lobe, right bronchus or lung: Secondary | ICD-10-CM | POA: Insufficient documentation

## 2018-07-25 DIAGNOSIS — C3491 Malignant neoplasm of unspecified part of right bronchus or lung: Secondary | ICD-10-CM

## 2018-07-25 NOTE — Progress Notes (Signed)
Theresa Vincent, [place] [address]   CLINIC:  Medical Oncology/Hematology  PCP:  Sharilyn Sites, [place] [address] [phone]   REASON FOR VISIT:  Follow-up for stage IVa adenocarcinoma of the right lung  CURRENT THERAPY: work up   INTERVAL HISTORY:  Theresa Vincent 67 y.o. female returns for routine follow-up for non small cell lung cancer. Patient is here today with her 4 sisters and daughter. She is having everyday headaches and balance problems. She has had nausea and vomiting with her headaches. She is taking Zofran which is keeping this under control. This has caused her constipation. She is going to start taking stool softeners. She has not fallen anymore since she fell and hit her head a few weeks ago. She is more fatigued during the day. Patient lives at home with her daughter and granddaughter. She performs all her own ADLs and Denies any new pains. Denies any SOB or chest pain. Patients appetite is 25% and has no energy.     REVIEW OF SYSTEMS:  Review of Systems  Constitutional: Positive for fatigue.  HENT:  Negative.   Eyes: Negative.   Respiratory: Negative.   Cardiovascular: Negative.   Gastrointestinal: Positive for constipation, nausea and vomiting.  Endocrine: Negative.   Genitourinary: Negative.    Skin: Negative.   Neurological: Positive for dizziness, extremity weakness and headaches.  Hematological: Negative.   Psychiatric/Behavioral: Negative.      PAST MEDICAL/SURGICAL HISTORY:  Past Medical History:  Diagnosis Date  . Arthritis    in back   . Diverticula of colon   . Diverticulosis   . Hemorrhoid   . Thyroid disease    was hyperthyroid had frozen and now on meds   Past Surgical History:  Procedure Laterality Date  . Broken toe screw in toe    . EYE SURGERY  03/20/16  . pt had her bladder stretched in the past     Dr. Michela Pitcher   . radiation thyroid    . WRIST SURGERY Right       SOCIAL HISTORY:  Social History   Socioeconomic History  . Marital status: Legally Separated    Spouse name: Not on file  . Number of children: Not on file  . Years of education: hs   . Highest education level: Not on file  Occupational History    Employer: [employer]  . Financial resource strain: Not on file  . Food insecurity:    Worry: Not on file    Inability: Not on file  . Transportation needs:    Medical: Not on file    Non-medical: Not on file  Tobacco Use  . Smoking status: Former Smoker    Packs/day: 0.00    Years: 45.00    Pack years: 0.00  . Smokeless tobacco: Never Used  Substance and Sexual Activity  . Alcohol use: Yes    Comment: rarely   . Drug use: No  . Sexual activity: Not Currently    Birth control/protection: Post-menopausal  Lifestyle  . Physical activity:    Days per week: Not on file    Minutes per session: Not on file  . Stress: Not on file  Relationships  . Social connections:    Talks on phone: Not on file    Gets together: Not on file    Attends religious service: Not on file    Active member of club or organization: Not on file  Attends meetings of clubs or organizations: Not on file    Relationship status: Not on file  . Intimate partner violence:    Fear of current or ex partner: Not on file    Emotionally abused: Not on file    Physically abused: Not on file    Forced sexual activity: Not on file  Other Topics Concern  . Not on file  Social History Narrative  . Not on file    FAMILY HISTORY:  Family History  Problem Relation Age of Onset  . Diabetes Mother   . Heart disease Father   . Diabetes Father   . Diabetes Sister   . Diabetes Sister   . Cancer Sister        breast    CURRENT MEDICATIONS:  Outpatient Encounter Medications as of 07/25/2018  Medication Sig  . acetaminophen (TYLENOL) 500 MG tablet Take 500 mg by mouth daily as needed for moderate pain or headache.  . Calcium  Carbonate-Vitamin D (CALCIUM 500 + D PO) Take 1 tablet by mouth daily.  Marland Kitchen dexamethasone (DECADRON) 4 MG tablet Take 1 tablet (4 mg total) by mouth every 6 (six) hours.  . docusate sodium (COLACE) 250 MG capsule Take 250 mg by mouth daily.  Marland Kitchen levothyroxine (SYNTHROID, LEVOTHROID) 25 MCG tablet Take 1 tablet (25 mcg total) by mouth daily before breakfast.  . Multiple Vitamin (MULTIVITAMIN WITH MINERALS) TABS tablet Take 1 tablet by mouth daily.  . ondansetron (ZOFRAN) 8 MG tablet Take by mouth every 8 (eight) hours as needed for nausea or vomiting.  . pseudoephedrine-acetaminophen (TYLENOL SINUS) 30-500 MG TABS tablet Take 1 tablet by mouth daily as needed (allergies).   No facility-administered encounter medications on file as of 07/25/2018.     ALLERGIES:  Allergies  Allergen Reactions  . Avelox [Moxifloxacin Hcl In Nacl] Itching     PHYSICAL EXAM:  ECOG Performance status: 1  VITALS SIGNS: BP:128/69, P: 87, R: 16, TEMP: 97.8, SATS:97%  Physical Exam   LABORATORY DATA:  I have reviewed the labs as listed.  CBC    Component Value Date/Time   WBC 16.9 (H) 07/04/2018 1549   RBC 5.34 (H) 07/04/2018 1549   HGB 15.3 (H) 07/04/2018 1549   HCT 46.2 (H) 07/04/2018 1549   PLT 428 (H) 07/04/2018 1549   MCV 86.5 07/04/2018 1549   MCH 28.7 07/04/2018 1549   MCHC 33.1 07/04/2018 1549   RDW 14.8 07/04/2018 1549   LYMPHSABS 2.0 07/04/2018 1549   MONOABS 1.1 (H) 07/04/2018 1549   EOSABS 0.0 07/04/2018 1549   BASOSABS 0.0 07/04/2018 1549   CMP Latest Ref Rng & Units 07/04/2018  Glucose 70 - 99 mg/dL 134(H)  BUN 8 - 23 mg/dL 11  Creatinine 0.44 - 1.00 mg/dL 0.48  Sodium 135 - 145 mmol/L 138  Potassium 3.5 - 5.1 mmol/L 3.9  Chloride 98 - 111 mmol/L 102  CO2 22 - 32 mmol/L 24  Calcium 8.9 - 10.3 mg/dL 9.4  Total Protein 6.5 - 8.1 g/dL 8.3(H)  Total Bilirubin 0.3 - 1.2 mg/dL 0.6  Alkaline Phos 38 - 126 U/L 133(H)  AST 15 - 41 U/L 23  ALT 0 - 44 U/L 11       DIAGNOSTIC IMAGING:    I have personally reviewed the images of the MRI of the brain with the patient and her family.     ASSESSMENT & PLAN:   Non-small cell lung cancer, right (HCC) 1.  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 TBenjamine Mola  Flexible fiberoptic laryngoscopy showed normal examination. - She underwent right submandibular lymph node biopsy on 06/26/2018 which showed metastatic poorly differentiated adenocarcinoma, TTF-1 positive, CK positive. - Patient is a current active smoker, quit 2 weeks ago, smoked about half pack per day for more than 25 years. -PET CT scan on 07/15/2018 shows hypermetabolic 1.8 cm right upper lobe pulmonary nodule, suspicious for primary bronchus and carcinoma, ipsilateral hypermetabolic mediastinal adenopathy consistent with metastatic disease, 2.3 cm hypermetabolic right submandibular lymph node. - Patient reportedly fell 2 weeks ago and since then has been feeling dizzy and unsteady.  Also has occasional nausea and vomited for the last 2 weeks mostly after eating.  Taking Zofran which occasionally helps. -We reviewed the results of the brain MRI dated 07/24/2018 which showed extremely widespread metastatic disease to the brain, more than 40 meta stasis, largest measuring 2.5 cm.  Associated vasogenic edema is more pronounced in the cerebellum.  I have reviewed images with the patient and her family. -We have started her on dexamethasone 4 mg every 6 hours.  We talked about the prognosis of lung cancer with brain metastasis in detail.  Any therapy will be given in the palliative setting.  I would make a referral to radiation oncology for whole brain radiation therapy. -We will send her tumor for foundation 1 testing and PDL 1 testing.  I will see her back in 2 weeks for follow-up.  2.  Family history: -She had one sister who had TNBC at  436  Another sister had breast cancer at age 66  Maternal aunt had breast cancer at age 66  I have recommended her sister to undergo testing for BRCA 1/2 .  Total time spent is 40 minutes with more than 50% of the time spent face-to-face discussing scan results, prognosis and treatment options.    Orders placed this encounter:  Orders Placed This Encounter  Procedures  . CBC with Differential/Platelet  . Comprehensive metabolic panel      SDerek Jack MD [id number]

## 2018-07-25 NOTE — Assessment & Plan Note (Addendum)
1.  Stage IVa (T1BN2M1B) poorly differentiated adenocarcinoma of the right lung with extensive brain metastasis: - Presentation with the 2 to 68-monthhistory of right submandibular mass, seen by Dr. TBenjamine Mola  Flexible fiberoptic laryngoscopy showed normal examination. - She underwent right submandibular lymph node biopsy on 06/26/2018 which showed metastatic poorly differentiated adenocarcinoma, TTF-1 positive, CK positive. - Patient is a current active smoker, quit 2 weeks ago, smoked about half pack per day for more than 25 years. -PET CT scan on 07/15/2018 shows hypermetabolic 1.8 cm right upper lobe pulmonary nodule, suspicious for primary bronchus and carcinoma, ipsilateral hypermetabolic mediastinal adenopathy consistent with metastatic disease, 2.3 cm hypermetabolic right submandibular lymph node. - Patient reportedly fell 2 weeks ago and since then has been feeling dizzy and unsteady.  Also has occasional nausea and vomited for the last 2 weeks mostly after eating.  Taking Zofran which occasionally helps. -We reviewed the results of the brain MRI dated 07/24/2018 which showed extremely widespread metastatic disease to the brain, more than 40 meta stasis, largest measuring 2.5 cm.  Associated vasogenic edema is more pronounced in the cerebellum.  I have reviewed images with the patient and her family. -We have started her on dexamethasone 4 mg every 6 hours.  We talked about the prognosis of lung cancer with brain metastasis in detail.  Any therapy will be given in the palliative setting.  I would make a referral to radiation oncology for whole brain radiation therapy. -We will send her tumor for foundation 1 testing and PDL 1 testing.  I will see her back in 2 weeks for follow-up.  2.  Family history: -She had one sister who had TNBC at 477  Another sister had breast cancer at age 66  Maternal aunt had breast cancer at age 66  I have recommended her sister to undergo testing for BRCA 1/2 .

## 2018-07-25 NOTE — Progress Notes (Signed)
I spoke with Theresa Vincent in pathology to order foundation one and PDL-1. Stage IV. Dx: F68.12

## 2018-07-31 ENCOUNTER — Encounter (HOSPITAL_COMMUNITY): Payer: Self-pay | Admitting: Hematology

## 2018-08-01 ENCOUNTER — Ambulatory Visit
Admission: RE | Admit: 2018-08-01 | Discharge: 2018-08-01 | Disposition: A | Discharge status: Home / Self Care | Payer: Medicare Other | Source: Ambulatory Visit | Attending: Radiation Oncology | Admitting: Radiation Oncology

## 2018-08-01 ENCOUNTER — Other Ambulatory Visit: Payer: Self-pay

## 2018-08-01 ENCOUNTER — Other Ambulatory Visit (HOSPITAL_COMMUNITY): Payer: Self-pay | Admitting: *Deleted

## 2018-08-01 ENCOUNTER — Encounter: Payer: Self-pay | Admitting: Radiation Oncology

## 2018-08-01 VITALS — BP 135/92 | HR 54 | Temp 98.4°F | Resp 20 | Ht 62.0 in | Wt 138.8 lb

## 2018-08-01 DIAGNOSIS — C3491 Malignant neoplasm of unspecified part of right bronchus or lung: Secondary | ICD-10-CM | POA: Insufficient documentation

## 2018-08-01 DIAGNOSIS — C7931 Secondary malignant neoplasm of brain: Secondary | ICD-10-CM

## 2018-08-01 DIAGNOSIS — Z79890 Hormone replacement therapy (postmenopausal): Secondary | ICD-10-CM | POA: Insufficient documentation

## 2018-08-01 DIAGNOSIS — Z510 Encounter for antineoplastic radiation therapy: Secondary | ICD-10-CM | POA: Diagnosis not present

## 2018-08-01 DIAGNOSIS — C3411 Malignant neoplasm of upper lobe, right bronchus or lung: Secondary | ICD-10-CM | POA: Diagnosis not present

## 2018-08-01 DIAGNOSIS — Z7952 Long term (current) use of systemic steroids: Secondary | ICD-10-CM | POA: Diagnosis not present

## 2018-08-01 DIAGNOSIS — Z87891 Personal history of nicotine dependence: Secondary | ICD-10-CM | POA: Insufficient documentation

## 2018-08-01 DIAGNOSIS — Z79899 Other long term (current) drug therapy: Secondary | ICD-10-CM | POA: Insufficient documentation

## 2018-08-01 NOTE — Progress Notes (Signed)
Per Dr. Delton Coombes, patient to have repeat biopsy of right submandibular lymphnode.  Orders placed.

## 2018-08-01 NOTE — Progress Notes (Signed)
Radiation Oncology         [phone] ________________________________  Initial Outpatient Consultation  Name: Theresa Vincent MRN: [id number]  Date: 08/01/2018  DOB: [birth year]  NG:[id number], Jenny Reichmann, MD  Derek Jack, MD   REFERRING PHYSICIAN: Derek Jack, MD  DIAGNOSIS: The primary encounter diagnosis was Brain metastases [facility]). A diagnosis of Non-small cell lung cancer, right [facility]) was also pertinent to this visit.   Stage IVa (T1BN2M1B) poorly differentiated adenocarcinoma of the right lungwith extensive brain metastasis  HISTORY OF PRESENT ILLNESS::Theresa Vincent is a 67 y.o. female who is accompanied by her daughter. She presented to her endocrinologist on 05/21/18 for a follow-up and reported a knot on her neck. Dr. Dorris Fetch palpated a 3 cm firm lymph node on the right submandibular area. After a round of antibiotics was unsuccessful, it was biopsied on 06/26/18 under Dr. Benjamine Mola, which revealed: metastatic poorly differentiated carcinoma. The patient then presented to the ED on 07/04/18 after a fall, leading to headaches, dizziness, constipation, and vomiting. A chest x-ray was performed during her visit revealing: 1. Ill-defined nodular density within the RIGHT suprahilar lung, medial aspect, with a probable correlate on the lateral view projected over the anterior clear space. This could represent neoplastic nodule or a confluence of chronic bronchitic changes/scarring. Recommend chest CT for further characterization. 2. Probable chronic interstitial lung disease. 3. No evidence of pneumonia or pulmonary edema. A PET scan ordered by Dr. Benjamine Mola was performed on 07/15/18 and showed: hypermetabolic 1.8 cm right upper lobe pulmonary nodule, suspicious for primary bronchogenic carcinoma; Ipsilateral hypermetabolic mediastinal lymphadenopathy, consistent with metastatic disease; 2.3 cm hypermetabolic right submandibular lymph node or mass, likely due to metastatic disease; no evidence of  metastatic disease within the abdomen or pelvis. She proceeded to meet with Dr. Delton Coombes in hematology/onc on 07/16/18. She had a brain MRI performed on 07/24/18 which showed: 1. Positive for extremely widespread metastatic disease to the brain. Greater than 40 metastases scattered throughout the brain including the brainstem and cerebellum. Individual metastases range from punctate to 25 mm diameter. 2. Associated vasogenic edema is most pronounced in the cerebellum, moderate. There is subsequent mild effacement of the 4th ventricle, but no ventriculomegaly. 3. No other significant intracranial mass effect. No impending herniation. 4. Superimposed benign thyroid eye disease suspected.   She reports headaches, blurred vision, dizziness, and nausea, which are lessened with decadron. She denies any pain. She presents with a walker following the fall.   PREVIOUS RADIATION THERAPY: No  PAST MEDICAL HISTORY:  has a past medical history of Arthritis, Diverticula of colon, Diverticulosis, Hemorrhoid, and Thyroid disease.    PAST SURGICAL HISTORY: Past Surgical History:  Procedure Laterality Date  . Broken toe screw in toe    . EYE SURGERY  03/20/16  . pt had her bladder stretched in the past     Dr. Michela Pitcher   . radiation thyroid    . WRIST SURGERY Right     FAMILY HISTORY: family history includes Cancer in her sister; Diabetes in her father, mother, sister, and sister; Heart disease in her father.  SOCIAL HISTORY:  reports that she has quit smoking. She smoked 0.00 packs per day for 45.00 years. She has never used smokeless tobacco. She reports that she drinks alcohol. She reports that she does not use drugs.  ALLERGIES: Avelox [moxifloxacin hcl in nacl]  MEDICATIONS:  Current Outpatient Medications  Medication Sig Dispense Refill  . acetaminophen (TYLENOL) 500 MG tablet Take 500 mg by mouth daily as needed for  moderate pain or headache.    . Calcium Carbonate-Vitamin D (CALCIUM 500 + D PO) Take  1 tablet by mouth daily.    Marland Kitchen dexamethasone (DECADRON) 4 MG tablet Take 1 tablet (4 mg total) by mouth every 6 (six) hours. 120 tablet 1  . docusate sodium (COLACE) 250 MG capsule Take 250 mg by mouth daily.    Marland Kitchen levothyroxine (SYNTHROID, LEVOTHROID) 25 MCG tablet Take 1 tablet (25 mcg total) by mouth daily before breakfast. 90 tablet 0  . Multiple Vitamin (MULTIVITAMIN WITH MINERALS) TABS tablet Take 1 tablet by mouth daily.    . ondansetron (ZOFRAN) 8 MG tablet Take by mouth every 8 (eight) hours as needed for nausea or vomiting.    . pseudoephedrine-acetaminophen (TYLENOL SINUS) 30-500 MG TABS tablet Take 1 tablet by mouth daily as needed (allergies).     No current facility-administered medications for this encounter.     REVIEW OF SYSTEMS: REVIEW OF SYSTEMS: A 10+ POINT REVIEW OF SYSTEMS WAS OBTAINED including neurology, dermatology, psychiatry, cardiac, respiratory, lymph, extremities, GI, GU, musculoskeletal, constitutional, reproductive, HEENT. All pertinent positives are noted in the HPI. All others are negative.   PHYSICAL EXAM:  height is 5\' 2"  (1.575 m) and weight is 138 lb 12.8 oz (63 kg). Her oral temperature is 98.4 F (36.9 C). Her blood pressure is 135/92 (abnormal) and her pulse is 54 (abnormal). Her respiration is 20 and oxygen saturation is 100%.   General: Alert and oriented, in no acute distress HEENT: Head is normocephalic. Extraocular movements are intact. Oropharynx is clear. No thrush Neck: Neck is supple, 2.5 x 2.5 cm right submandibular node, nontender Heart: Regular in rate and rhythm with no murmurs, rubs, or gallops. Chest: Clear to auscultation bilaterally, with no rhonchi, wheezes, or rales. Abdomen: Soft, nontender, nondistended, with no rigidity or guarding. Extremities: No cyanosis or edema. Lymphatics: see Neck Exam Skin: No concerning lesions. Musculoskeletal: symmetric strength and muscle tone throughout. Neurologic: Cranial nerves II through XII are  grossly intact. No obvious focalities. Speech is fluent. Mild confusion. Finger-to-nose test was markedly abnormal.  Psychiatric: Judgment and insight are intact. Affect is appropriate.   ECOG = 2  0 - Asymptomatic (Fully active, able to carry on all predisease activities without restriction)  1 - Symptomatic but completely ambulatory (Restricted in physically strenuous activity but ambulatory and able to carry out work of a light or sedentary nature. For example, light housework, office work)  2 - Symptomatic, <50% in bed during the day (Ambulatory and capable of all self care but unable to carry out any work activities. Up and about more than 50% of waking hours)  3 - Symptomatic, >50% in bed, but not bedbound (Capable of only limited self-care, confined to bed or chair 50% or more of waking hours)  4 - Bedbound (Completely disabled. Cannot carry on any self-care. Totally confined to bed or chair)  5 - Death   Eustace Pen MM, Creech RH, Tormey DC, et al. [phone]). "Toxicity and response criteria of the [facility]". [place] Oncol. 5 (6): 649-55  LABORATORY DATA:  Lab Results  Component Value Date   WBC 16.9 (H) 07/04/2018   HGB 15.3 (H) 07/04/2018   HCT 46.2 (H) 07/04/2018   MCV 86.5 07/04/2018   PLT 428 (H) 07/04/2018   NEUTROABS 13.9 (H) 07/04/2018   Lab Results  Component Value Date   NA 138 07/04/2018   K 3.9 07/04/2018   CL 102 07/04/2018   CO2 24 07/04/2018  GLUCOSE 134 (H) 07/04/2018   CREATININE 0.48 07/04/2018   CALCIUM 9.4 07/04/2018      RADIOGRAPHY: Ct Abdomen Pelvis Wo Contrast  Result Date: 07/04/2018 CLINICAL DATA:  Constipation and vomiting for several days EXAM: CT ABDOMEN AND PELVIS WITHOUT CONTRAST TECHNIQUE: Multidetector CT imaging of the abdomen and pelvis was performed following the standard protocol without IV contrast. COMPARISON:  CT abdomen pelvis of 04/23/2006 FINDINGS: Lower chest: A small nodular opacity within the right  lower lobe on image 4 series 4 is stable compared to the prior CT and therefore considered benign. No pneumonia or effusion is seen. The heart is within normal limits in size. There is a small hiatal hernia noted. Hepatobiliary: The liver is unremarkable in the unenhanced state. No focal hepatic lesion is seen. No calcified gallstones are noted. Pancreas: The pancreas is normal in size. The pancreatic duct is not dilated. Spleen: The spleen is unremarkable. Adrenals/Urinary Tract: The adrenal glands appear normal. A 6 mm right renal calculus appears unchanged. No hydronephrosis is seen. The ureters are normal in caliber to the bladder. The urinary bladder is unremarkable. Stomach/Bowel: The stomach is not well distended and a small hiatal hernia is again noted. No small bowel abnormality is seen. There is a large rectal fecal ball present somewhat distending the rectal wall. No edema is evident. The colon is unremarkable. The appendix and terminal ileum appear normal. Vascular/Lymphatic: The abdominal aorta is normal in caliber with moderate abdominal aortic atherosclerosis. No adenopathy is seen. Reproductive: The uterus is normal in size. No adnexal lesion is seen. No fluid is noted within the pelvis. Other: No abdominal wall hernia is seen. Musculoskeletal: The lumbar vertebrae are in normal alignment. There is diffuse degenerative disc disease with some sparing of the L5-S1 level. No compression deformity is seen. IMPRESSION: 1. There is a large fecal ball distending the rectum but no rectal mucosal edema is seen currently. There is only a normal amount of feces throughout the remainder of the colon more proximally. 2. 6 mm nonobstructing right renal calculus is unchanged. No hydronephrosis. 3. Moderate abdominal aortic atherosclerosis. 4. Diffuse degenerative disc disease of the lumbar spine. Electronically Signed   By: Ivar Drape M.D.   On: 07/04/2018 16:59   Dg Chest 2 View  Result Date:  07/04/2018 CLINICAL DATA:  Constant patient and vomiting, generalized abdominal pain for 3 days. EXAM: CHEST - 2 VIEW COMPARISON:  Chest x-ray dated 03/26/2013. FINDINGS: Heart size and mediastinal contours are within normal limits. Coarse lung markings are seen bilaterally suggesting chronic interstitial lung disease. No confluent opacity to suggest a developing pneumonia. No pleural effusion or pneumothorax seen. New ill-defined nodular density is seen within the suprahilar RIGHT lung, versus a confluence of chronic bronchitic changes. No acute or suspicious osseous finding. IMPRESSION: 1. Ill-defined nodular density within the RIGHT suprahilar lung, medial aspect, with a probable correlate on the lateral view projected over the anterior clear space. This could represent neoplastic nodule or a confluence of chronic bronchitic changes/scarring. Recommend chest CT for further characterization. 2. Probable chronic interstitial lung disease. 3. No evidence of pneumonia or pulmonary edema. Electronically Signed   By: Franki Cabot M.D.   On: 07/04/2018 16:56   Mr Jeri Cos WJ Contrast  Addendum Date: 07/24/2018   ADDENDUM REPORT: 07/24/2018 10:10 ADDENDUM: Study discussed by telephone with Derek Jack, MD on 07/24/2018 at 0953 hours. Electronically Signed   By: Genevie Ann M.D.   On: 07/24/2018 10:10   Result Date: 07/24/2018 CLINICAL  DATA:  67 year old female recently diagnosed with hypermetabolic right lung lesion suspicious for bronchogenic carcinoma and evidence of mediastinal metastatic lymphadenopathy. Weakness and dizziness. EXAM: MRI HEAD WITHOUT AND WITH CONTRAST TECHNIQUE: Multiplanar, multiecho pulse sequences of the brain and surrounding structures were obtained without and with intravenous contrast. CONTRAST:  19mL MULTIHANCE GADOBENATE DIMEGLUMINE 529 MG/ML IV SOLN COMPARISON:  PET-CT 07/15/2018. FINDINGS: Brain: Widespread enhancing brain metastases. There are 9 metastases in the cerebellum alone,  ranging from punctate to 25 millimeters diameter. All told there are greater than 40 metastases scattered throughout the brain. The largest are partially cystic or necrotic measuring up to 25 millimeters diameter (including in the posterior right temporal lobe on series 14, image 36). The smallest are punctate. Involvement includes a 5 millimeter lesion in the dorsal right midbrain (series 14, image 34). There is associated widespread vasogenic edema scattered throughout the brain, but only mild associated supratentorial mass effect. However, there is moderate cerebellar edema associated with mild effacement of the 4th ventricle (series 16, image 11). There is no ventriculomegaly at this time. No significant blood products associated with the lesions. Many show restricted diffusion compatible with hyper cellularity. No dural thickening is identified. No restricted diffusion to suggest acute infarction. No midline shift, extra-axial collection or acute intracranial hemorrhage. Cervicomedullary junction and pituitary are within normal limits. No cortical encephalomalacia. Vascular: Major intracranial vascular flow voids are preserved. The distal left vertebral artery appears dominant. The major dural venous sinuses are enhancing and appear patent. Skull and upper cervical spine: Normal visible cervical spine and spinal cord. Visible bone marrow signal is within normal limits. Sinuses/Orbits: There is symmetric enlargement of the bilateral medial and inferior rectus extra-ocular muscles (series 13, image 22) not associated with any suspicious diffusion changes or heterogeneous enhancement within the muscles. The patient carries a diagnosis of thyroid disease in the EMR. Other orbits soft tissues appear normal. Paranasal sinuses are clear. Mastoid air cells are well pneumatized. Other: Visible internal auditory structures appear normal. Scalp and face soft tissues appear negative. IMPRESSION: 1. Positive for extremely  widespread metastatic disease to the brain. Greater than 40 metastases scattered throughout the brain including the brainstem and cerebellum. Individual metastases range from punctate to 25 mm diameter. 2. Associated vasogenic edema is most pronounced in the cerebellum, moderate. There is subsequent mild effacement of the 4th ventricle, but no ventriculomegaly. 3. No other significant intracranial mass effect. No impending herniation. 4. Superimposed benign thyroid eye disease suspected. Electronically Signed: By: Genevie Ann M.D. On: 07/24/2018 09:38   Nm Pet Image Initial (pi) Skull Base To Thigh  Result Date: 07/16/2018 CLINICAL DATA:  Initial treatment strategy for right lung nodule and right neck mass. EXAM: NUCLEAR MEDICINE PET SKULL BASE TO THIGH TECHNIQUE: 9.3 mCi F-18 FDG was injected intravenously. Full-ring PET imaging was performed from the skull base to thigh after the radiotracer. CT data was obtained and used for attenuation correction and anatomic localization. Fasting blood glucose: 116 mg/dl COMPARISON:  None. FINDINGS: (Reference/background mediastinal blood pool activity: SUV max = 2.8) NECK: Right level 1B submandibular lymph node or mass is seen which measures 2.3 cm and has SUV max 12.8. No other hypermetabolic lymph nodes or masses identified within the neck. Incidental CT findings:  None. CHEST: A 1.8 cm spiculated nodule is seen in the anteromedial right upper lobe which has SUV max of 6.8. No other hypermetabolic pulmonary nodules identified Hypermetabolic mediastinal lymphadenopathy is seen in the right paratracheal region which measures 1.8 cm and has SUV  max of 8.6. A 10 mm hypermetabolic mediastinal lymph node is also seen along the right lateral wall of the ascending aorta. No hypermetabolic hilar or axillary lymphadenopathy identified. Incidental CT findings: Pulmonary emphysema. A few right lower lobe sub-cm pulmonary nodules show no FDG uptake but are too small to characterize.  Mild emphysema. ABDOMEN/PELVIS: No abnormal hypermetabolic activity within the liver, pancreas, adrenal glands, or spleen. No hypermetabolic lymph nodes in the abdomen or pelvis. Incidental CT findings: Small hiatal hernia. Nonobstructing right renal calculus. Aortic atherosclerosis. SKELETON: No focal hypermetabolic bone lesions to suggest skeletal metastasis. Incidental CT findings:  None. IMPRESSION: Hypermetabolic 1.8 cm right upper lobe pulmonary nodule, suspicious for primary bronchogenic carcinoma. Ipsilateral hypermetabolic mediastinal lymphadenopathy, consistent with metastatic disease. 2.3 cm hypermetabolic right submandibular lymph node or mass, likely due to metastatic disease. No evidence of metastatic disease within the abdomen or pelvis. Electronically Signed   By: Earle Gell M.D.   On: 07/16/2018 10:40      IMPRESSION: Stage IVa (T1BN2M1B) poorly differentiated adenocarcinoma of the right lungwith extensive brain metastasis The patient would be a good candidate for whole brain radiation therapy. She clinically has improved with her steroids for nausea and vomiting as well as headaches and unsteadiness with walking. She still requires a walker to ambulate.  PLAN: She will proceed to CT simulation following her appointment with me. We will schedule her first treatment for tomorrow. We will plan for 10 radiation treatments.     ------------------------------------------------  Blair Promise, PhD, MD  This document serves as a record of services personally performed by Gery Pray, MD. It was created on his behalf by Wilburn Mylar, a trained medical scribe. The creation of this record is based on the scribe's personal observations and the provider's statements to them. This document has been checked and approved by the attending provider.

## 2018-08-01 NOTE — Progress Notes (Signed)
  Radiation Oncology         [phone] ________________________________  Name: Theresa Vincent MRN: [id number]  Date: 08/01/2018  DOB: 08/28/1951  SIMULATION AND TREATMENT PLANNING NOTE    ICD-10-CM   1. Brain metastases ([place]) C79.31     DIAGNOSIS:  Stage IVa (T1BN2M1B) poorly differentiated adenocarcinoma of the right lungwith extensive brain metastasis  NARRATIVE:  The patient was brought to the [facility].  Identity was confirmed.  All relevant records and images related to the planned course of therapy were reviewed.  The patient freely provided informed written consent to proceed with treatment after reviewing the details related to the planned course of therapy. The consent form was witnessed and verified by the simulation staff.  Then, the patient was set-up in a stable reproducible  supine position for radiation therapy.  CT images were obtained.  Surface markings were placed.  The CT images were loaded into the planning software.  Then the target and avoidance structures were contoured.  Treatment planning then occurred.  The radiation prescription was entered and confirmed.  Then, I designed and supervised the construction of a total of 3 medically necessary complex treatment devices.  I have requested : Isodose Plan.  I have ordered: dose calculation.  PLAN:  The patient will receive 30 Gy in 10 fractions. Treatment to start tomorrow.  -----------------------------------  Blair Promise, PhD, MD

## 2018-08-01 NOTE — Progress Notes (Signed)
Location/Histology of Brain Tumor: Stage IVa (T1BN2M1B) poorly differentiated adenocarcinoma of the right lung with extensive brain metastasis:     Per MRI 07/24/18:  IMPRESSION: 1. Positive for extremely widespread metastatic disease to the brain. Greater than 40 metastases scattered throughout the brain including the brainstem and cerebellum. Individual metastases range from punctate to 25 mm diameter. 2. Associated vasogenic edema is most pronounced in the cerebellum, moderate. There is subsequent mild effacement of the 4th ventricle, but no ventriculomegaly. 3. No other significant intracranial mass effect. No impending herniation. 4. Superimposed benign thyroid eye disease suspected.  Patient presented with symptoms of:  Weakness and dizziness  Past or anticipated interventions, if any, per neurosurgery: None  Past or anticipated interventions, if any, per medical oncology: Per Dr. Delton Coombes 07/25/18:  .  Stage IVa (T1BN2M1B) poorly differentiated adenocarcinoma of the right lung with extensive brain metastasis: - Presentation with the 2 to 63-month history of right submandibular mass, seen by Dr. Benjamine Mola.  Flexible fiberoptic laryngoscopy showed normal examination. - She underwent right submandibular lymph node biopsy on 06/26/2018 which showed metastatic poorly differentiated adenocarcinoma, TTF-1 positive, CK positive. - Patient is a current active smoker, quit 2 weeks ago, smoked about half pack per day for more than 25 years. -PET CT scan on 07/15/2018 shows hypermetabolic 1.8 cm right upper lobe pulmonary nodule, suspicious for primary bronchus and carcinoma, ipsilateral hypermetabolic mediastinal adenopathy consistent with metastatic disease, 2.3 cm hypermetabolic right submandibular lymph node. - Patient reportedly fell 2 weeks ago and since then has been feeling dizzy and unsteady.  Also has occasional nausea and vomited for the last 2 weeks mostly after eating.  Taking Zofran which  occasionally helps. -We reviewed the results of the brain MRI dated 07/24/2018 which showed extremely widespread metastatic disease to the brain, more than 40 meta stasis, largest measuring 2.5 cm.  Associated vasogenic edema is more pronounced in the cerebellum.  I have reviewed images with the patient and her family. -We have started her on dexamethasone 4 mg every 6 hours.  We talked about the prognosis of lung cancer with brain metastasis in detail.  Any therapy will be given in the palliative setting.  I would make a referral to radiation oncology for whole brain radiation therapy. -We will send her tumor for foundation 1 testing and PDL 1 testing.  I will see her back in 2 weeks for follow-up.    Dose of Decadron, if applicable:  dexamethasone 4 mg every 6 hours    Recent neurologic symptoms, if any:   Seizures: No  Headaches: per daughter, headaches "ease" with OTC Ibuprofen, but are constant  Nausea: None since Decadron  Dizziness/ataxia: :Yes, but has improved since Decadron  Difficulty with hand coordination: Yes  Focal numbness/weakness: Yes  Visual deficits/changes: Yes, but pt has PMH of eye problems. "Flecks" in vision on peripheral vision.  Confusion/Memory deficits: No  Painful bone metastases at present, if any: None  SAFETY ISSUES:  Prior radiation? No  Pacemaker/ICD? No  Possible current pregnancy? No, pt is post menopausal  Is the patient on methotrexate? No  Additional Complaints / other details: Pt presents for initial consult with Dr. Geraldo [facility] for whole brain radiation. Pt is anxious and states BP is higher than baseline. Pt is accompanied by daughter.

## 2018-08-02 ENCOUNTER — Ambulatory Visit
Admission: RE | Admit: 2018-08-02 | Discharge: 2018-08-02 | Disposition: A | Discharge status: Home / Self Care | Payer: Medicare Other | Source: Ambulatory Visit | Attending: Radiation Oncology | Admitting: Radiation Oncology

## 2018-08-02 DIAGNOSIS — C7931 Secondary malignant neoplasm of brain: Secondary | ICD-10-CM | POA: Diagnosis not present

## 2018-08-02 DIAGNOSIS — Z510 Encounter for antineoplastic radiation therapy: Secondary | ICD-10-CM | POA: Diagnosis not present

## 2018-08-02 DIAGNOSIS — C3491 Malignant neoplasm of unspecified part of right bronchus or lung: Secondary | ICD-10-CM | POA: Diagnosis not present

## 2018-08-05 ENCOUNTER — Ambulatory Visit
Admission: RE | Admit: 2018-08-05 | Discharge: 2018-08-05 | Disposition: A | Discharge status: Home / Self Care | Payer: Medicare Other | Source: Ambulatory Visit | Attending: Radiation Oncology | Admitting: Radiation Oncology

## 2018-08-05 DIAGNOSIS — C3491 Malignant neoplasm of unspecified part of right bronchus or lung: Secondary | ICD-10-CM | POA: Diagnosis not present

## 2018-08-05 DIAGNOSIS — C7931 Secondary malignant neoplasm of brain: Secondary | ICD-10-CM | POA: Diagnosis not present

## 2018-08-05 DIAGNOSIS — Z510 Encounter for antineoplastic radiation therapy: Secondary | ICD-10-CM | POA: Diagnosis not present

## 2018-08-06 ENCOUNTER — Ambulatory Visit
Admission: RE | Admit: 2018-08-06 | Discharge: 2018-08-06 | Disposition: A | Discharge status: Home / Self Care | Payer: Medicare Other | Source: Ambulatory Visit | Attending: Radiation Oncology | Admitting: Radiation Oncology

## 2018-08-06 DIAGNOSIS — Z510 Encounter for antineoplastic radiation therapy: Secondary | ICD-10-CM | POA: Diagnosis not present

## 2018-08-06 DIAGNOSIS — C3491 Malignant neoplasm of unspecified part of right bronchus or lung: Secondary | ICD-10-CM | POA: Diagnosis not present

## 2018-08-06 DIAGNOSIS — C7931 Secondary malignant neoplasm of brain: Secondary | ICD-10-CM | POA: Diagnosis not present

## 2018-08-07 ENCOUNTER — Other Ambulatory Visit: Payer: Self-pay | Admitting: Radiology

## 2018-08-07 ENCOUNTER — Ambulatory Visit
Admission: RE | Admit: 2018-08-07 | Discharge: 2018-08-07 | Disposition: A | Discharge status: Home / Self Care | Payer: Medicare Other | Source: Ambulatory Visit | Attending: Radiation Oncology | Admitting: Radiation Oncology

## 2018-08-07 DIAGNOSIS — C7931 Secondary malignant neoplasm of brain: Secondary | ICD-10-CM | POA: Diagnosis not present

## 2018-08-07 DIAGNOSIS — C3491 Malignant neoplasm of unspecified part of right bronchus or lung: Secondary | ICD-10-CM | POA: Diagnosis not present

## 2018-08-07 DIAGNOSIS — Z510 Encounter for antineoplastic radiation therapy: Secondary | ICD-10-CM | POA: Diagnosis not present

## 2018-08-08 ENCOUNTER — Ambulatory Visit
Admission: RE | Admit: 2018-08-08 | Discharge: 2018-08-08 | Disposition: A | Discharge status: Home / Self Care | Payer: Medicare Other | Source: Ambulatory Visit | Attending: Radiation Oncology | Admitting: Radiation Oncology

## 2018-08-08 ENCOUNTER — Ambulatory Visit (HOSPITAL_COMMUNITY)
Admission: RE | Admit: 2018-08-08 | Discharge: 2018-08-08 | Disposition: A | Discharge status: Home / Self Care | Payer: Medicare Other | Source: Ambulatory Visit | Attending: Hematology | Admitting: Hematology

## 2018-08-08 ENCOUNTER — Encounter (HOSPITAL_COMMUNITY): Payer: Self-pay

## 2018-08-08 DIAGNOSIS — C799 Secondary malignant neoplasm of unspecified site: Secondary | ICD-10-CM | POA: Diagnosis not present

## 2018-08-08 DIAGNOSIS — Z79890 Hormone replacement therapy (postmenopausal): Secondary | ICD-10-CM | POA: Insufficient documentation

## 2018-08-08 DIAGNOSIS — Z9889 Other specified postprocedural states: Secondary | ICD-10-CM | POA: Insufficient documentation

## 2018-08-08 DIAGNOSIS — Z87891 Personal history of nicotine dependence: Secondary | ICD-10-CM | POA: Insufficient documentation

## 2018-08-08 DIAGNOSIS — C801 Malignant (primary) neoplasm, unspecified: Secondary | ICD-10-CM | POA: Diagnosis not present

## 2018-08-08 DIAGNOSIS — C3411 Malignant neoplasm of upper lobe, right bronchus or lung: Secondary | ICD-10-CM | POA: Diagnosis not present

## 2018-08-08 DIAGNOSIS — Z833 Family history of diabetes mellitus: Secondary | ICD-10-CM | POA: Diagnosis not present

## 2018-08-08 DIAGNOSIS — C781 Secondary malignant neoplasm of mediastinum: Secondary | ICD-10-CM | POA: Insufficient documentation

## 2018-08-08 DIAGNOSIS — Z8249 Family history of ischemic heart disease and other diseases of the circulatory system: Secondary | ICD-10-CM | POA: Insufficient documentation

## 2018-08-08 DIAGNOSIS — E079 Disorder of thyroid, unspecified: Secondary | ICD-10-CM | POA: Diagnosis not present

## 2018-08-08 DIAGNOSIS — Z79899 Other long term (current) drug therapy: Secondary | ICD-10-CM | POA: Insufficient documentation

## 2018-08-08 DIAGNOSIS — C7931 Secondary malignant neoplasm of brain: Secondary | ICD-10-CM | POA: Diagnosis not present

## 2018-08-08 DIAGNOSIS — C3491 Malignant neoplasm of unspecified part of right bronchus or lung: Secondary | ICD-10-CM | POA: Diagnosis not present

## 2018-08-08 DIAGNOSIS — R590 Localized enlarged lymph nodes: Secondary | ICD-10-CM | POA: Diagnosis not present

## 2018-08-08 DIAGNOSIS — C770 Secondary and unspecified malignant neoplasm of lymph nodes of head, face and neck: Secondary | ICD-10-CM | POA: Diagnosis not present

## 2018-08-08 DIAGNOSIS — Z510 Encounter for antineoplastic radiation therapy: Secondary | ICD-10-CM | POA: Diagnosis not present

## 2018-08-08 DIAGNOSIS — D490 Neoplasm of unspecified behavior of digestive system: Secondary | ICD-10-CM | POA: Diagnosis not present

## 2018-08-08 LAB — PROTIME-INR
INR: 0.95
PROTHROMBIN TIME: 12.6 s (ref 11.4–15.2)

## 2018-08-08 MED ORDER — FENTANYL CITRATE (PF) 100 MCG/2ML IJ SOLN
INTRAMUSCULAR | Status: AC | PRN
  Administered 2018-08-08: 50 ug via INTRAVENOUS

## 2018-08-08 MED ORDER — FENTANYL CITRATE (PF) 100 MCG/2ML IJ SOLN
INTRAMUSCULAR | Status: AC
  Filled 2018-08-08: qty 2

## 2018-08-08 MED ORDER — LIDOCAINE HCL (PF) 1 % IJ SOLN
INTRAMUSCULAR | Status: AC
  Filled 2018-08-08: qty 30

## 2018-08-08 MED ORDER — MIDAZOLAM HCL 2 MG/2ML IJ SOLN
INTRAMUSCULAR | Status: AC | PRN
  Administered 2018-08-08: 1 mg via INTRAVENOUS
  Administered 2018-08-08: 0.5 mg via INTRAVENOUS

## 2018-08-08 MED ORDER — SODIUM CHLORIDE 0.9 % IV SOLN: INTRAVENOUS | Status: DC

## 2018-08-08 MED ORDER — MIDAZOLAM HCL 2 MG/2ML IJ SOLN
INTRAMUSCULAR | Status: AC
  Filled 2018-08-08: qty 2

## 2018-08-08 NOTE — Procedures (Signed)
Interventional Radiology Procedure Note  Procedure: US guided core biopsy of right submandibular lymph node  Complications: None  Estimated Blood Loss: < 10 mL  Findings: 16 G core biopsy x 5 of 2.5 cm right submandibular lymph node.  Venetia Night. Kathlene Cote, M.D Pager:  [phone]

## 2018-08-08 NOTE — Discharge Instructions (Signed)

## 2018-08-08 NOTE — H&P (Signed)
Chief Complaint: Patient was seen in consultation today for right submandibular lymph node biopsy at the request of Katragadda,Sreedhar  Referring Physician(s): [place]  Supervising Physician: Aletta Edouard  Patient Status: [name] - Out-pt  History of Present Illness: Theresa Vincent is a 67 y.o. female   Hx Lung Ca; brain metastasis Right submandibular node biopsy 06/26/2018: Lymph node, needle/core biopsy, right submandibular - METASTATIC POORLY DIFFERENTIATED CARCINOMA.  Need for genetic tests Now scheduled for additional samples per Dr Delton Coombes   Past Medical History:  Diagnosis Date  . Arthritis    in back   . Diverticula of colon   . Diverticulosis   . Hemorrhoid   . Thyroid disease    was hyperthyroid had frozen and now on meds    Past Surgical History:  Procedure Laterality Date  . Broken toe screw in toe    . EYE SURGERY  03/20/16  . pt had her bladder stretched in the past     Dr. Michela Pitcher   . radiation thyroid    . WRIST SURGERY Right     Allergies: Avelox [moxifloxacin hcl in nacl]  Medications: Prior to Admission medications   Medication Sig Start Date End Date Taking? Authorizing Provider  dexamethasone (DECADRON) 4 MG tablet Take 1 tablet (4 mg total) by mouth every 6 (six) hours. 07/24/18  Yes Derek Jack, MD  levothyroxine (SYNTHROID, LEVOTHROID) 25 MCG tablet Take 1 tablet (25 mcg total) by mouth daily before breakfast. 06/14/18  Yes Nida, Marella Chimes, MD  ondansetron (ZOFRAN) 8 MG tablet Take by mouth every 8 (eight) hours as needed for nausea or vomiting.   Yes [provider]  acetaminophen (TYLENOL) 500 MG tablet Take 500 mg by mouth daily as needed for moderate pain or headache.    [provider]  pseudoephedrine-acetaminophen (TYLENOL SINUS) 30-500 MG TABS tablet Take 1 tablet by mouth daily as needed (allergies).    [provider]     Family History  Problem Relation Age of Onset    . Diabetes Mother   . Heart disease Father   . Diabetes Father   . Diabetes Sister   . Diabetes Sister   . Cancer Sister        breast    Social History   Socioeconomic History  . Marital status: Legally Separated    Spouse name: Not on file  . Number of children: Not on file  . Years of education: hs   . Highest education level: Not on file  Occupational History    Employer: [employer]eds  . Financial resource strain: Not on file  . Food insecurity:    Worry: Not on file    Inability: Not on file  . Transportation needs:    Medical: Not on file    Non-medical: Not on file  Tobacco Use  . Smoking status: Former Smoker    Packs/day: 0.00    Years: 45.00    Pack years: 0.00  . Smokeless tobacco: Never Used  Substance and Sexual Activity  . Alcohol use: Yes    Comment: rarely   . Drug use: No  . Sexual activity: Not Currently    Birth control/protection: Post-menopausal  Lifestyle  . Physical activity:    Days per week: Not on file    Minutes per session: Not on file  . Stress: Not on file  Relationships  . Social connections:    Talks on phone: Not on file    Gets together:  Not on file    Attends religious service: Not on file    Active member of club or organization: Not on file    Attends meetings of clubs or organizations: Not on file    Relationship status: Not on file  Other Topics Concern  . Not on file  Social History Narrative  . Not on file    Review of Systems: A 12 point ROS discussed and pertinent positives are indicated in the HPI above.  All other systems are negative.  Review of Systems  Constitutional: Negative for activity change, appetite change and fatigue.  Respiratory: Negative for shortness of breath.   Cardiovascular: Negative for chest pain.  Gastrointestinal: Negative for abdominal pain.  Musculoskeletal: Negative for back pain.  Neurological: Negative for weakness.  Psychiatric/Behavioral: Negative for  behavioral problems and confusion.    Vital Signs: BP (!) 152/72   Pulse 64   Temp 97.8 F (36.6 C) (Oral)   Resp 16   Ht 5\' 2"  (1.575 m)   Wt 139 lb (63 kg)   SpO2 100%   BMI 25.42 kg/m   Physical Exam  Cardiovascular: Normal rate, regular rhythm and normal heart sounds.  Pulmonary/Chest: Effort normal and breath sounds normal.  Abdominal: Soft. Bowel sounds are normal.  Musculoskeletal: Normal range of motion.  Skin: Skin is warm and dry.  Psychiatric: She has a normal mood and affect. Her behavior is normal. Judgment and thought content normal.  Nursing note and vitals reviewed.   Imaging: Mr Jeri Cos GL Contrast  Addendum Date: 07/24/2018   ADDENDUM REPORT: 07/24/2018 10:10 ADDENDUM: Study discussed by telephone with Derek Jack, MD on 07/24/2018 at 0953 hours. Electronically Signed   By: Genevie Ann M.D.   On: 07/24/2018 10:10   Result Date: 07/24/2018 CLINICAL DATA:  67 year old female recently diagnosed with hypermetabolic right lung lesion suspicious for bronchogenic carcinoma and evidence of mediastinal metastatic lymphadenopathy. Weakness and dizziness. EXAM: MRI HEAD WITHOUT AND WITH CONTRAST TECHNIQUE: Multiplanar, multiecho pulse sequences of the brain and surrounding structures were obtained without and with intravenous contrast. CONTRAST:  95mL MULTIHANCE GADOBENATE DIMEGLUMINE 529 MG/ML IV SOLN COMPARISON:  PET-CT 07/15/2018. FINDINGS: Brain: Widespread enhancing brain metastases. There are 9 metastases in the cerebellum alone, ranging from punctate to 25 millimeters diameter. All told there are greater than 40 metastases scattered throughout the brain. The largest are partially cystic or necrotic measuring up to 25 millimeters diameter (including in the posterior right temporal lobe on series 14, image 36). The smallest are punctate. Involvement includes a 5 millimeter lesion in the dorsal right midbrain (series 14, image 34). There is associated widespread vasogenic  edema scattered throughout the brain, but only mild associated supratentorial mass effect. However, there is moderate cerebellar edema associated with mild effacement of the 4th ventricle (series 16, image 11). There is no ventriculomegaly at this time. No significant blood products associated with the lesions. Many show restricted diffusion compatible with hyper cellularity. No dural thickening is identified. No restricted diffusion to suggest acute infarction. No midline shift, extra-axial collection or acute intracranial hemorrhage. Cervicomedullary junction and pituitary are within normal limits. No cortical encephalomalacia. Vascular: Major intracranial vascular flow voids are preserved. The distal left vertebral artery appears dominant. The major dural venous sinuses are enhancing and appear patent. Skull and upper cervical spine: Normal visible cervical spine and spinal cord. Visible bone marrow signal is within normal limits. Sinuses/Orbits: There is symmetric enlargement of the bilateral medial and inferior rectus extra-ocular muscles (series 13, image  22) not associated with any suspicious diffusion changes or heterogeneous enhancement within the muscles. The patient carries a diagnosis of thyroid disease in the EMR. Other orbits soft tissues appear normal. Paranasal sinuses are clear. Mastoid air cells are well pneumatized. Other: Visible internal auditory structures appear normal. Scalp and face soft tissues appear negative. IMPRESSION: 1. Positive for extremely widespread metastatic disease to the brain. Greater than 40 metastases scattered throughout the brain including the brainstem and cerebellum. Individual metastases range from punctate to 25 mm diameter. 2. Associated vasogenic edema is most pronounced in the cerebellum, moderate. There is subsequent mild effacement of the 4th ventricle, but no ventriculomegaly. 3. No other significant intracranial mass effect. No impending herniation. 4.  Superimposed benign thyroid eye disease suspected. Electronically Signed: By: Genevie Ann M.D. On: 07/24/2018 09:38   Nm Pet Image Initial (pi) Skull Base To Thigh  Result Date: 07/16/2018 CLINICAL DATA:  Initial treatment strategy for right lung nodule and right neck mass. EXAM: NUCLEAR MEDICINE PET SKULL BASE TO THIGH TECHNIQUE: 9.3 mCi F-18 FDG was injected intravenously. Full-ring PET imaging was performed from the skull base to thigh after the radiotracer. CT data was obtained and used for attenuation correction and anatomic localization. Fasting blood glucose: 116 mg/dl COMPARISON:  None. FINDINGS: (Reference/background mediastinal blood pool activity: SUV max = 2.8) NECK: Right level 1B submandibular lymph node or mass is seen which measures 2.3 cm and has SUV max 12.8. No other hypermetabolic lymph nodes or masses identified within the neck. Incidental CT findings:  None. CHEST: A 1.8 cm spiculated nodule is seen in the anteromedial right upper lobe which has SUV max of 6.8. No other hypermetabolic pulmonary nodules identified Hypermetabolic mediastinal lymphadenopathy is seen in the right paratracheal region which measures 1.8 cm and has SUV max of 8.6. A 10 mm hypermetabolic mediastinal lymph node is also seen along the right lateral wall of the ascending aorta. No hypermetabolic hilar or axillary lymphadenopathy identified. Incidental CT findings: Pulmonary emphysema. A few right lower lobe sub-cm pulmonary nodules show no FDG uptake but are too small to characterize. Mild emphysema. ABDOMEN/PELVIS: No abnormal hypermetabolic activity within the liver, pancreas, adrenal glands, or spleen. No hypermetabolic lymph nodes in the abdomen or pelvis. Incidental CT findings: Small hiatal hernia. Nonobstructing right renal calculus. Aortic atherosclerosis. SKELETON: No focal hypermetabolic bone lesions to suggest skeletal metastasis. Incidental CT findings:  None. IMPRESSION: Hypermetabolic 1.8 cm right upper lobe  pulmonary nodule, suspicious for primary bronchogenic carcinoma. Ipsilateral hypermetabolic mediastinal lymphadenopathy, consistent with metastatic disease. 2.3 cm hypermetabolic right submandibular lymph node or mass, likely due to metastatic disease. No evidence of metastatic disease within the abdomen or pelvis. Electronically Signed   By: Earle Gell M.D.   On: 07/16/2018 10:40    Labs:  CBC: Recent Labs    06/26/18 1229 07/04/18 1549  WBC 10.9* 16.9*  HGB 13.7 15.3*  HCT 43.7 46.2*  PLT 447* 428*    COAGS: Recent Labs    06/26/18 1229  INR 0.98    BMP: Recent Labs    07/04/18 1549  NA 138  K 3.9  CL 102  CO2 24  GLUCOSE 134*  BUN 11  CALCIUM 9.4  CREATININE 0.48  GFRNONAA >60  GFRAA >60    LIVER FUNCTION TESTS: Recent Labs    07/04/18 1549  BILITOT 0.6  AST 23  ALT 11  ALKPHOS 133*  PROT 8.3*  ALBUMIN 4.4    TUMOR MARKERS: No results for input(s): AFPTM, CEA, CA199,  [place] in the last 8760 hours.  Assessment and Plan:  Hx Lung Ca; Brain mets Right submandibular node enlargement- bx 7/3: poorly differentiated carcinoma Need for genetic tests and now scheduled for additional sample bx Risks and benefits discussed with the patient including, but not limited to bleeding, infection, damage to adjacent structures or low yield requiring additional tests.  All of the patient's questions were answered, patient is agreeable to proceed. Consent signed and in chart.   Thank you for this interesting consult.  I greatly enjoyed meeting CHAMAINE STANKUS and look forward to participating in their care.  A copy of this report was sent to the requesting provider on this date.  Electronically Signed: Lavonia Drafts, PA-C 08/08/2018, 7:07 AM   I spent a total of    25 Minutes in face to face in clinical consultation, greater than 50% of which was counseling/coordinating care for right submandibular LN Bx

## 2018-08-09 ENCOUNTER — Ambulatory Visit
Admission: RE | Admit: 2018-08-09 | Discharge: 2018-08-09 | Disposition: A | Discharge status: Home / Self Care | Payer: Medicare Other | Source: Ambulatory Visit | Attending: Radiation Oncology | Admitting: Radiation Oncology

## 2018-08-09 DIAGNOSIS — C7931 Secondary malignant neoplasm of brain: Secondary | ICD-10-CM | POA: Diagnosis not present

## 2018-08-09 DIAGNOSIS — Z510 Encounter for antineoplastic radiation therapy: Secondary | ICD-10-CM | POA: Diagnosis not present

## 2018-08-09 DIAGNOSIS — C3491 Malignant neoplasm of unspecified part of right bronchus or lung: Secondary | ICD-10-CM | POA: Diagnosis not present

## 2018-08-11 ENCOUNTER — Ambulatory Visit: Admission: RE | Admit: 2018-08-11 | Payer: Medicare Other | Source: Ambulatory Visit

## 2018-08-12 ENCOUNTER — Ambulatory Visit
Admission: RE | Admit: 2018-08-12 | Discharge: 2018-08-12 | Disposition: A | Discharge status: Home / Self Care | Payer: Medicare Other | Source: Ambulatory Visit | Attending: Radiation Oncology | Admitting: Radiation Oncology

## 2018-08-12 DIAGNOSIS — C3491 Malignant neoplasm of unspecified part of right bronchus or lung: Secondary | ICD-10-CM | POA: Diagnosis not present

## 2018-08-12 DIAGNOSIS — Z510 Encounter for antineoplastic radiation therapy: Secondary | ICD-10-CM | POA: Diagnosis not present

## 2018-08-12 DIAGNOSIS — C7931 Secondary malignant neoplasm of brain: Secondary | ICD-10-CM | POA: Diagnosis not present

## 2018-08-13 ENCOUNTER — Ambulatory Visit
Admission: RE | Admit: 2018-08-13 | Discharge: 2018-08-13 | Disposition: A | Discharge status: Home / Self Care | Payer: Medicare Other | Source: Ambulatory Visit | Attending: Radiation Oncology | Admitting: Radiation Oncology

## 2018-08-13 DIAGNOSIS — Z510 Encounter for antineoplastic radiation therapy: Secondary | ICD-10-CM | POA: Diagnosis not present

## 2018-08-13 DIAGNOSIS — C3491 Malignant neoplasm of unspecified part of right bronchus or lung: Secondary | ICD-10-CM | POA: Diagnosis not present

## 2018-08-13 DIAGNOSIS — C7931 Secondary malignant neoplasm of brain: Secondary | ICD-10-CM | POA: Diagnosis not present

## 2018-08-14 ENCOUNTER — Encounter (HOSPITAL_COMMUNITY): Payer: Self-pay | Admitting: *Deleted

## 2018-08-14 ENCOUNTER — Ambulatory Visit
Admission: RE | Admit: 2018-08-14 | Discharge: 2018-08-14 | Disposition: A | Discharge status: Home / Self Care | Payer: Medicare Other | Source: Ambulatory Visit | Attending: Radiation Oncology | Admitting: Radiation Oncology

## 2018-08-14 DIAGNOSIS — C3491 Malignant neoplasm of unspecified part of right bronchus or lung: Secondary | ICD-10-CM | POA: Diagnosis not present

## 2018-08-14 DIAGNOSIS — C7931 Secondary malignant neoplasm of brain: Secondary | ICD-10-CM | POA: Diagnosis not present

## 2018-08-14 DIAGNOSIS — Z510 Encounter for antineoplastic radiation therapy: Secondary | ICD-10-CM | POA: Diagnosis not present

## 2018-08-14 NOTE — Progress Notes (Signed)
I spoke with Theresa Vincent in pathology to order PDL-1 on accession # [id number].

## 2018-08-15 ENCOUNTER — Ambulatory Visit
Admission: RE | Admit: 2018-08-15 | Discharge: 2018-08-15 | Disposition: A | Discharge status: Home / Self Care | Payer: Medicare Other | Source: Ambulatory Visit | Attending: Radiation Oncology | Admitting: Radiation Oncology

## 2018-08-15 ENCOUNTER — Ambulatory Visit (HOSPITAL_COMMUNITY): Payer: Medicare Other | Admitting: Hematology

## 2018-08-15 DIAGNOSIS — C7931 Secondary malignant neoplasm of brain: Secondary | ICD-10-CM | POA: Diagnosis not present

## 2018-08-15 DIAGNOSIS — C3491 Malignant neoplasm of unspecified part of right bronchus or lung: Secondary | ICD-10-CM | POA: Diagnosis not present

## 2018-08-15 DIAGNOSIS — Z510 Encounter for antineoplastic radiation therapy: Secondary | ICD-10-CM | POA: Diagnosis not present

## 2018-08-16 ENCOUNTER — Ambulatory Visit: Payer: Medicare Other

## 2018-08-19 ENCOUNTER — Encounter: Payer: Self-pay | Admitting: Radiation Oncology

## 2018-08-19 ENCOUNTER — Ambulatory Visit: Payer: Medicare Other

## 2018-08-19 NOTE — Progress Notes (Signed)
  Radiation Oncology         [phone]) [phone] ________________________________  Name: Theresa Vincent MRN: [id number]  Date: 08/19/2018  DOB: 1951-07-27  End of Treatment Note  Diagnosis:   Stage IVa (T1BN2M1B) poorly differentiated adenocarcinoma of the right lungwith extensive brain metastasis     Indication for treatment:  Palliative       Radiation treatment dates:   08/02/2018-08/15/2018  Site/dose:   Brain, 3 Gy in 10 fractions for a total dose of 30 Gy  Beams/energy:   Isodose Plan/Photon, 6X  Narrative: The patient tolerated radiation treatment relatively well. At the beginning of treatment, pt reported waxy feeling to her tongue, intermittent HA (relieved with IBU), and chronic mild blurred vision. She denied fatigue, hearing changes, nausea, vomiting, fine motor skill changes, On PE, she was conversant and alert. Towards the end of treatment, she noted daily headaches (relieved with IBU), chronic occassional blurred vision. She denied auditoy changes or cognitive changes.  Plan: The patient has completed radiation treatment. The patient will return to radiation oncology clinic for routine followup in one month. I advised them to call or return sooner if they have any questions or concerns related to their recovery or treatment.  -----------------------------------  Blair Promise, PhD, MD  This document serves as a record of services personally performed by Gery Pray, MD. It was created on his behalf by [facility], a trained medical scribe. The creation of this record is based on the scribe's personal observations and the provider's statements to them. This document has been checked and approved by the attending provider.

## 2018-08-20 ENCOUNTER — Ambulatory Visit: Payer: Medicare Other

## 2018-08-21 ENCOUNTER — Ambulatory Visit: Payer: Medicare Other

## 2018-08-21 ENCOUNTER — Ambulatory Visit: Payer: Medicare Other | Admitting: "Endocrinology

## 2018-08-27 ENCOUNTER — Telehealth: Payer: Self-pay

## 2018-08-27 ENCOUNTER — Telehealth (HOSPITAL_COMMUNITY): Payer: Self-pay | Admitting: *Deleted

## 2018-08-27 ENCOUNTER — Other Ambulatory Visit (HOSPITAL_COMMUNITY): Payer: Self-pay | Admitting: Nurse Practitioner

## 2018-08-27 DIAGNOSIS — C3491 Malignant neoplasm of unspecified part of right bronchus or lung: Secondary | ICD-10-CM

## 2018-08-27 MED ORDER — FUROSEMIDE 20 MG PO TABS: 20.0000 mg | ORAL_TABLET | Freq: Every day | ORAL | 0 refills | Status: DC

## 2018-08-27 NOTE — Telephone Encounter (Signed)
Patient wanted to verify appointment. Also mailed a letter. Per 9/3 los

## 2018-08-27 NOTE — Telephone Encounter (Signed)
Daughter calling to report pt's new onset BLE edema. Daughter asking if pt's BLE edema could be due to whole brain radiation. Conveyed to daughter that per Dr. Sondra Come, pt's edema was not caused by radiation to brain. Encouraged daughter to contact pt's PCP to see if pt could be evaluated by PCP today. Daughter verbalized understanding and agreement. Loma Sousa, RN BSN

## 2018-08-27 NOTE — Telephone Encounter (Signed)
Pt's daughter called stating that the pt is having severe swelling in her ankles and feet that started last Thursday and has progressively gotten worse. Olivia Mackie stated that her mom's swelling was so bad the indention left was about 1/2 an inch deep last night. She stated that her mom said the swelling is painful and her feet and ankles throb. Pt denied any redness of warmth to the areas.  I spoke with Francene Finders NP and she advised that she would send in some LAsix for the pt.   Olivia Mackie aware that Lasix is being sent in to the phar,acy for her mom. She will call back if there are any changes.

## 2018-08-28 ENCOUNTER — Ambulatory Visit (HOSPITAL_COMMUNITY): Payer: Medicare Other | Admitting: Hematology

## 2018-09-03 ENCOUNTER — Telehealth (HOSPITAL_COMMUNITY): Payer: Self-pay | Admitting: *Deleted

## 2018-09-04 ENCOUNTER — Inpatient Hospital Stay (HOSPITAL_COMMUNITY): Payer: Medicare Other

## 2018-09-04 ENCOUNTER — Telehealth: Payer: Self-pay

## 2018-09-04 ENCOUNTER — Encounter (HOSPITAL_COMMUNITY): Payer: Self-pay | Admitting: Internal Medicine

## 2018-09-04 ENCOUNTER — Inpatient Hospital Stay (HOSPITAL_COMMUNITY): Payer: Medicare Other | Attending: Hematology | Admitting: Internal Medicine

## 2018-09-04 VITALS — BP 123/70 | HR 107 | Temp 98.2°F | Resp 16 | Wt 144.4 lb

## 2018-09-04 DIAGNOSIS — M545 Low back pain: Secondary | ICD-10-CM | POA: Insufficient documentation

## 2018-09-04 DIAGNOSIS — E039 Hypothyroidism, unspecified: Secondary | ICD-10-CM | POA: Insufficient documentation

## 2018-09-04 DIAGNOSIS — C7931 Secondary malignant neoplasm of brain: Secondary | ICD-10-CM

## 2018-09-04 DIAGNOSIS — Z452 Encounter for adjustment and management of vascular access device: Secondary | ICD-10-CM | POA: Insufficient documentation

## 2018-09-04 DIAGNOSIS — R51 Headache: Secondary | ICD-10-CM | POA: Insufficient documentation

## 2018-09-04 DIAGNOSIS — C3491 Malignant neoplasm of unspecified part of right bronchus or lung: Secondary | ICD-10-CM

## 2018-09-04 DIAGNOSIS — C3411 Malignant neoplasm of upper lobe, right bronchus or lung: Secondary | ICD-10-CM | POA: Diagnosis not present

## 2018-09-04 DIAGNOSIS — Z87891 Personal history of nicotine dependence: Secondary | ICD-10-CM | POA: Diagnosis not present

## 2018-09-04 DIAGNOSIS — Z5111 Encounter for antineoplastic chemotherapy: Secondary | ICD-10-CM | POA: Insufficient documentation

## 2018-09-04 MED ORDER — PROCHLORPERAZINE MALEATE 10 MG PO TABS: 10.0000 mg | ORAL_TABLET | Freq: Four times a day (QID) | ORAL | 1 refills | Status: DC | PRN

## 2018-09-04 MED ORDER — CYANOCOBALAMIN 1000 MCG/ML IJ SOLN
1000.0000 ug | Freq: Once | INTRAMUSCULAR | Status: AC
  Administered 2018-09-04: 1000 ug via INTRAMUSCULAR

## 2018-09-04 MED ORDER — DEXAMETHASONE 4 MG PO TABS: ORAL_TABLET | ORAL | 1 refills | Status: AC

## 2018-09-04 MED ORDER — MISC. DEVICES MISC: 0 refills | Status: DC

## 2018-09-04 MED ORDER — LIDOCAINE-PRILOCAINE 2.5-2.5 % EX CREA: TOPICAL_CREAM | CUTANEOUS | 3 refills | Status: AC

## 2018-09-04 MED ORDER — LIDOCAINE-PRILOCAINE 2.5-2.5 % EX CREA: TOPICAL_CREAM | CUTANEOUS | 3 refills | Status: DC

## 2018-09-04 MED ORDER — ONDANSETRON HCL 8 MG PO TABS: 8.0000 mg | ORAL_TABLET | Freq: Two times a day (BID) | ORAL | 1 refills | Status: DC | PRN

## 2018-09-04 MED ORDER — HYDROCODONE-ACETAMINOPHEN 5-300 MG PO TABS: 1.0000 | ORAL_TABLET | ORAL | 0 refills | Status: DC | PRN

## 2018-09-04 MED ORDER — ONDANSETRON HCL 8 MG PO TABS: 8.0000 mg | ORAL_TABLET | Freq: Two times a day (BID) | ORAL | 1 refills | Status: AC | PRN

## 2018-09-04 MED ORDER — DEXAMETHASONE 4 MG PO TABS: ORAL_TABLET | ORAL | 1 refills | Status: DC

## 2018-09-04 NOTE — Patient Instructions (Signed)
[facility] Chemotherapy Teaching   You have been diagnosed with stage IV non small cell lung cancer with metastases to the brain.  You are going to be treated with palliative intent. This means that your cancer is treatable but not curable.  We are going to be giving you chemotherapy through your PICC line that will be placed tomorrow before your first appointment.  You will get the chemotherapy agent Alimta (pemetrexed).  You will see the doctor regularly throughout treatment.  We monitor your lab work prior to every treatment. The doctor monitors your response to treatment by the way you are feeling, your blood work, and scans periodically.  There will be wait times while you are here for treatment.  It will take about 30 minutes to 1 hour for your lab work to result.  Then there will be wait times while pharmacy mixes your medications.   Premedications given prior to chemotherapy  Compazine - nausea medication to treat chemotherapy induced nausea.   Pemetrexed (Alimta)  About This Drug Pemetrexed is used to treat cancer. It is given in the vein (IV)  Possible Side Effects . Tiredness . Decreased appetite (decreased hunger) . Nausea  Note: Each of the side effects above was reported in 20% or greater of patients treated with pemetrexed. Not all possible side effects are included above. You may have different side effects if you are receiving premetrexed in combination with other chemotherapy agents.  Warnings and Precautions . Bone marrow suppression. This is a decrease in the number of white blood cells, red blood cells, and platelets. This may raise your risk of infection, make you tired and weak (fatigue), and raise your risk of bleeding. . Changes in your kidney function, which can be life-threatening . Severe allergic skin reaction, which can be life-threatening. You may develop a rash with fluid-filled bumps/blisters, and/or a red skin rash which sometimes can be  weeping (peeling off). . Inflammation (swelling) of the lungs. You may have a dry cough or trouble breathing. . If you have received radiation treatments, your skin may become red after receving pemetrexed. This reaction is called "radiation recall." Your body is recalling, or remembering, that it had radiation therapy.  Note: Some of the side effects above are very rare. If you have concerns and/or questions, please discuss them with your medical team.  Important Information . This drug may be present in the saliva, tears, sweat, urine, stool, vomit, semen, and vaginal secretions. Talk to your doctor and/or your nurse about the necessary precautions to take during this time.  Treating Side Effects . Manage tiredness by pacing your activities for the day. . Be sure to include periods of rest between energy-draining activities. . To decrease the risk of infection, wash your hands regularly. . Avoid close contact with people who have a cold, the flu, or other infections. . Take your temperature as your doctor or nurse tells you, and whenever you feel like you may have a fever. . To help decrease the risk of bleeding, use a soft toothbrush. Check with your nurse before using dental floss. . Be very careful when using knives or tools. . Use an electric shaver instead of a razor. . Drink plenty of fluids (a minimum of eight glasses per day is recommended). . To help with nausea, eat small, frequent meals instead of three large meals a day. Choose foods and drinks that are at room temperature. Ask your nurse or doctor about other helpful tips and medicine  that is available to help stop or lessen these symptoms. . To help with decreased appetite, eat small, frequent meals. Eat foods high in calories and protein, such as meat, poultry, fish, dry beans, tofu, eggs, nuts, milk, yogurt, cheese, ice cream, pudding, and nutritional supplements. . Consider using sauces and spices to increase taste.  Daily exercise, with your doctor's approval, may increase your appetite. . Avoid sun exposure and apply sunscreen routinely when outdoors. . If you get a rash do not put anything on it unless your doctor or nurse says you may. Keep the area around the rash clean and dry. Ask your doctor for medicine if your rash bothers you. . If you received radiation, and your skin becomes red or irritated again, follow the same care instructions you did during radiation treatment. Be sure to tell the nurse or doctor administering your chemotherapy about your skin changes.  Food and Drug Interactions . There are no known interactions of pemetrexed with food. . Check with your doctor or pharmacist about all other prescription medicines and over-the-counter medicines and dietary supplements (vitamins, minerals, herbs and others) you are taking before starting this medicine as there are known drug interactions with pemetrexed. Also, check with your doctor or pharmacist before starting any new prescription or over-the-counter medicines, or dietary supplements to make sure that there are no interactions. . There are known interactions of pemetrexed with other medicines and products like aspirin, ibuprofen. Ask your doctor what over-the-counter (OTC) medicines you can take for fever, headache and muscle and joint pain.  When to Call the Doctor Call your doctor or nurse if you have any of these symptoms and/or any new or unusual symptoms: . Fever of 100.4 F (38 C) or higher . Chills . Tiredness that interferes with your daily activities. . Feeling dizzy or lightheaded . Easy bleeding or bruising . Pain in your chest . Dry cough . Trouble breathing . Decreased or very dark urine . A new rash or a rash that is not relieved by prescribed medicines . Flu-like symptoms: fever, headache, muscle and joint aches, and fatigue (low energy, feeling weak) . Nausea that stops you from eating or drinking and/or is  not relieved by prescribed medicines . Lasting loss of appetite or rapid weight loss of five pounds in a week . If you think you may be pregnant or may have impregnated your partner  Reproduction Warnings . Pregnancy warning: This drug can have harmful effects on the unborn baby. Women of childbearing potential should use effective methods of birth control during your cancer treatment and for 6 months after treatment. Males with female partners of childbearing potential should use effective contraception during treatment and for 3 months after treatment. Let your doctor know right away if you think you may be pregnant or may have impregnated your partner. . Breastfeeding warning: It is not known if this drug passes into breast milk. Women should not breastfeed during treatment and for 1 week after treatment because this drug could enter the breast milk and cause harm to a breastfeeding baby. . Fertility warning: In men this drug may affect your ability to have children in the future. Talk with your doctor or nurse if you plan to have children. Ask for information on sperm banking.  SELF CARE ACTIVITIES WHILE ON CHEMOTHERAPY:  Hydration Increase your fluid intake 48 hours prior to treatment and drink at least 8 to 12 cups (64 ounces) of water/decaffeinated beverages per day after treatment. You can still have  your cup of coffee or soda but these beverages do not count as part of your 8 to 12 cups that you need to drink daily. No alcohol intake.  Medications Continue taking your normal prescription medication as prescribed.  If you start any new herbal or new supplements please let us know first to make sure it is safe.  Mouth Care Have teeth cleaned professionally before starting treatment. Keep dentures and partial plates clean. Use soft toothbrush and do not use mouthwashes that contain alcohol. Biotene is a good mouthwash that is available at most pharmacies or may be ordered by calling  [phone]. Use warm salt water gargles (1 teaspoon salt per 1 quart warm water) before and after meals and at bedtime. Or you may rinse with 2 tablespoons of three-percent hydrogen peroxide mixed in eight ounces of water. If you are still having problems with your mouth or sores in your mouth please call the clinic. If you need dental work, please let the doctor know before you go for your appointment so that we can coordinate the best possible time for you in regards to your chemo regimen. You need to also let your dentist know that you are actively taking chemo. We may need to do labs prior to your dental appointment.  Skin Care Always use sunscreen that has not expired and with SPF (Sun Protection Factor) of 50 or higher. Wear hats to protect your head from the sun. Remember to use sunscreen on your hands, ears, face, & feet.  Use good moisturizing lotions such as udder cream, eucerin, or even Vaseline. Some chemotherapies can cause dry skin, color changes in your skin and nails.    . Avoid long, hot showers or baths. . Use gentle, fragrance-free soaps and laundry detergent. . Use moisturizers, preferably creams or ointments rather than lotions because the thicker consistency is better at preventing skin dehydration. Apply the cream or ointment within 15 minutes of showering. Reapply moisturizer at night, and moisturize your hands every time after you wash them.  Hair Loss (if your doctor says your hair will fall out)  . If your doctor says that your hair is likely to fall out, decide before you begin chemo whether you want to wear a wig. You may want to shop before treatment to match your hair color. . Hats, turbans, and scarves can also camouflage hair loss, although some people prefer to leave their heads uncovered. If you go bare-headed outdoors, be sure to use sunscreen on your scalp. . Cut your hair short. It eases the inconvenience of shedding lots of hair, but it also can reduce the  emotional impact of watching your hair fall out. . Don't perm or color your hair during chemotherapy. Those chemical treatments are already damaging to hair and can enhance hair loss. Once your chemo treatments are done and your hair has grown back, it's OK to resume dyeing or perming hair. With chemotherapy, hair loss is almost always temporary. But when it grows back, it may be a different color or texture. In older adults who still had hair color before chemotherapy, the new growth may be completely gray.  Often, new hair is very fine and soft.  Infection Prevention Please wash your hands for at least 30 seconds using warm soapy water. Handwashing is the #1 way to prevent the spread of germs. Stay away from sick people or people who are getting over a cold. If you develop respiratory systems such as green/yellow mucus production or productive  cough or persistent cough let us know and we will see if you need an antibiotic. It is a good idea to keep a pair of gloves on when going into grocery stores/[employer] to decrease your risk of coming into contact with germs on the carts, etc. Carry alcohol hand gel with you at all times and use it frequently if out in public. If your temperature reaches 100.5 or higher please call the clinic and let us know.  If it is after hours or on the weekend please go to the ER if your temperature is over 100.5.  Please have your own personal thermometer at home to use.    Sex and bodily fluids If you are going to have sex, a condom must be used to protect the person that isn't taking chemotherapy. Chemo can decrease your libido (sex drive). For a few days after chemotherapy, chemotherapy can be excreted through your bodily fluids.  When using the toilet please close the lid and flush the toilet twice.  Do this for a few day after you have had chemotherapy.   Effects of chemotherapy on your sex life Some changes are simple and won't last long. They won't affect your sex life  permanently. Sometimes you may feel: . too tired . not strong enough to be very active . sick or sore  . not in the mood . anxious or low Your anxiety might not seem related to sex. For example, you may be worried about the cancer and how your treatment is going. Or you may be worried about money, or about how you family are coping with your illness. These things can cause stress, which can affect your interest in sex. It's important to talk to your partner about how you feel. Remember - the changes to your sex life don't usually last long. There's usually no medical reason to stop having sex during chemo. The drugs won't have any long term physical effects on your performance or enjoyment of sex. Cancer can't be passed on to your partner during sex  Contraception It's important to use reliable contraception during treatment. Avoid getting pregnant while you or your partner are having chemotherapy. This is because the drugs may harm the baby. Sometimes chemotherapy drugs can leave a man or woman infertile.  This means you would not be able to have children in the future. You might want to talk to someone about permanent infertility. It can be very difficult to learn that you may no longer be able to have children. Some people find counselling helpful. There might be ways to preserve your fertility, although this is easier for men than for women. You may want to speak to a fertility expert. You can talk about sperm banking or harvesting your eggs. You can also ask about other fertility options, such as donor eggs. If you have or have had breast cancer, your doctor might advise you not to take the contraceptive pill. This is because the hormones in it might affect the cancer.  It is not known for sure whether or not chemotherapy drugs can be passed on through semen or secretions from the vagina. Because of this some doctors advise people to use a barrier method if you have sex during treatment. This  applies to vaginal, anal or oral sex. Generally, doctors advise a barrier method only for the time you are actually having the treatment and for about a week after your treatment. Advice like this can be worrying, but this does not mean  that you have to avoid being intimate with your partner. You can still have close contact with your partner and continue to enjoy sex.  Animals If you have cats or birds we just ask that you not change the litter or change the cage.  Please have someone else do this for you while you are on chemotherapy.   Food Safety During and After Cancer Treatment Food safety is important for people both during and after cancer treatment. Cancer and cancer treatments, such as chemotherapy, radiation therapy, and stem cell/bone marrow transplantation, often weaken the immune system. This makes it harder for your body to protect itself from foodborne illness, also called food poisoning. Foodborne illness is caused by eating food that contains harmful bacteria, parasites, or viruses.  Foods to avoid Some foods have a higher risk of becoming tainted with bacteria. These include: Marland Kitchen Unwashed fresh fruit and vegetables, especially leafy vegetables that can hide dirt and other contaminants . Raw sprouts, such as alfalfa sprouts . Raw or undercooked beef, especially ground beef, or other raw or undercooked meat and poultry . Fatty, fried, or spicy foods immediately before or after treatment.  These can sit heavy on your stomach and make you feel nauseous. . Raw or undercooked shellfish, such as oysters. . Sushi and sashimi, which often contain raw fish.  . Unpasteurized beverages, such as unpasteurized fruit juices, raw milk, raw yogurt, or cider . Undercooked eggs, such as soft boiled, over easy, and poached; raw, unpasteurized eggs; or foods made with raw egg, such as homemade raw cookie dough and homemade mayonnaise Simple steps for food safety Shop smart. . Do not buy food stored  or displayed in an unclean area. . Do not buy bruised or damaged fruits or vegetables. . Do not buy cans that have cracks, dents, or bulges. . Pick up foods that can spoil at the end of your shopping trip and store them in a cooler on the way home. Prepare and clean up foods carefully. . Rinse all fresh fruits and vegetables under running water, and dry them with a clean towel or paper towel. . Clean the top of cans before opening them. . After preparing food, wash your hands for 20 seconds with hot water and soap. Pay special attention to areas between fingers and under nails. . Clean your utensils and dishes with hot water and soap. Marland Kitchen Disinfect your kitchen and cutting boards using 1 teaspoon of liquid, unscented bleach mixed into 1 quart of water.   Dispose of old food. . Eat canned and packaged food before its expiration date (the "use by" or "best before" date). . Consume refrigerated leftovers within 3 to 4 days. After that time, throw out the food. Even if the food does not smell or look spoiled, it still may be unsafe. Some bacteria, such as Listeria, can grow even on foods stored in the refrigerator if they are kept for too long. Take precautions when eating out. . At restaurants, avoid buffets and salad bars where food sits out for a long time and comes in contact with many people. Food can become contaminated when someone with a virus, often a norovirus, or another "bug" handles it. . Put any leftover food in a "to-go" container yourself, rather than having the server do it. And, refrigerate leftovers as soon as you get home. . Choose restaurants that are clean and that are willing to prepare your food as you order it cooked.   MEDICATIONS:  Compazine/Prochlorperazine 10mg  tablet. Take 1 tablet every 6 hours as needed for  nausea/vomiting. (This can make you sleepy)   EMLA cream. Apply a quarter size amount to port site 1 hour prior to chemo. Do not rub in. Cover with plastic wrap.   Over-the-Counter Meds:  Colace - 100 mg capsules - take 2 capsules daily.  If this doesn't help then you can increase to 2 capsules twice daily.  Call us if this does not help your bowels move.   Imodium 2mg  capsule. Take 2 capsules after the 1st loose stool and then 1 capsule every 2 hours until you go a total of 12 hours without having a loose stool. Call the [facility] if loose stools continue. If diarrhea occurs at bedtime, take 2 capsules at bedtime. Then take 2 capsules every 4 hours until morning. Call [facility].    Diarrhea Sheet   If you are having loose stools/diarrhea, please purchase Imodium and begin taking as outlined:  At the first sign of poorly formed or loose stools you should begin taking Imodium (loperamide) 2 mg capsules.  Take two caplets (4mg ) followed by one caplet (2mg ) every 2 hours until you have had no diarrhea for 12 hours.  During the night take two caplets (4mg ) at bedtime and continue every 4 hours during the night until the morning.  Stop taking Imodium only after there is no sign of diarrhea for 12 hours.    Always call the [facility] if you are having loose stools/diarrhea that you can't get under control.  Loose stools/diarrhea leads to dehydration (loss of water) in your body.  We have other options of trying to get the loose stools/diarrhea to stop but you must let us know!   Constipation Sheet  Colace - 100 mg capsules - take 2 capsules daily.  If this doesn't help then you can increase to 2 capsules twice daily.  Please call if the above does not work for you.   Do not go more than 2 days without a bowel movement.  It is very important that you do not become constipated.  It will make you feel sick to your stomach (nausea) and can cause abdominal pain and vomiting.   Nausea  Sheet   Compazine/Prochlorperazine 10mg  tablet. Take 1 tablet every 6 hours as needed for nausea/vomiting. (This can make you sleepy)  If you are having persistent nausea (nausea that does not stop) please call the [facility] and let us know the amount of nausea that you are experiencing.  If you begin to vomit, you need to call the [facility] and if it is the weekend and you have vomited more than one time and can't get it to stop-go to the Emergency Room.  Persistent nausea/vomiting can lead to dehydration (loss of fluid in your body) and will make you feel terrible.   Ice chips, sips of clear liquids, foods that are @ room temperature, crackers, and toast tend to be better tolerated.   SYMPTOMS TO REPORT AS SOON AS POSSIBLE AFTER TREATMENT:   FEVER GREATER THAN 100.5 F  CHILLS WITH OR WITHOUT FEVER  NAUSEA AND VOMITING THAT IS NOT CONTROLLED WITH YOUR NAUSEA MEDICATION  UNUSUAL SHORTNESS OF BREATH  UNUSUAL BRUISING OR BLEEDING  TENDERNESS IN MOUTH AND THROAT WITH OR WITHOUT PRESENCE OF ULCERS  URINARY PROBLEMS  BOWEL PROBLEMS  UNUSUAL RASH      Wear comfortable clothing and clothing appropriate for easy access to any Portacath or PICC line. Let  us know if there is anything that we can do to make your therapy better!    What to do if you need assistance after hours or on the weekends: CALL [phone].  HOLD on the line, do not hang up.  You will hear multiple messages but at the end you will be connected with a nurse triage line.  They will contact the doctor if necessary.  Most of the time they will be able to assist you.  Do not call the hospital operator.      I have been informed and understand all of the instructions given to me and have received a copy. I have been instructed to call the clinic [phone] or my family physician as soon as possible for continued medical care, if indicated. I do not have any more questions at this time but understand that I  may call the [facility] or the Patient Navigator at [phone] during office hours should I have questions or need assistance in obtaining follow-up care.

## 2018-09-04 NOTE — Patient Instructions (Addendum)
[place] at [facility] Discharge Instructions  You saw Dr. Walden Field today. Please start taking Folic Acid 1 mg daily. Prescription has been sent in to pharmacy.   You also got your B-12 injection today that is required every 9 weeks during treatment of Alimta.   Thank you for choosing [place] at [facility] to provide your oncology and hematology care.  To afford each patient quality time with our provider, please arrive at least 15 minutes before your scheduled appointment time.   If you have a lab appointment with the [facility] please come in thru the  Main Entrance and check in at the main information desk  You need to re-schedule your appointment should you arrive 10 or more minutes late.  We strive to give you quality time with our providers, and arriving late affects you and other patients whose appointments are after yours.  Also, if you no show three or more times for appointments you may be dismissed from the clinic at the providers discretion.     Again, thank you for choosing [facility].  Our hope is that these requests will decrease the amount of time that you wait before being seen by our physicians.       _____________________________________________________________  Should you have questions after your visit to [facility], please contact our office at [phone] between the hours of 8:00 a.m. and 4:30 p.m.  Voicemails left after 4:00 p.m. will not be returned until the following business day.  For prescription refill requests, have your pharmacy contact our office and allow 72 hours.    Cancer Center Support Programs:   > Cancer Support Group  2nd Tuesday of the month 1pm-2pm, Journey Room

## 2018-09-04 NOTE — Progress Notes (Signed)
Patient provided with information from [email or web address] for Alimta. Patient advised to begin Folic Acid 1 mg daily and B-12 injection to be given today.

## 2018-09-04 NOTE — Telephone Encounter (Signed)
Returned VM from pt's daughter. Spoke with pt, who states she is still having daily headaches. Pt reports headaches are not worse than previously reported and are relieved by Ibuprofen. Pt reports she is "a little more dizzy". Encouraged pt to closely monitor dizziness, headaches, and any vision changes. Conveyed to pt that this RN would contact her on Friday, 09/06/18 to check in on pt now that Decadron taper has finished.  Loma Sousa, RN BSN   Contacted pt's daughter, Linus Orn, to convey that this RN had contacted pt for update. Tracey added that pt was having increased back pain. Pt has an appt this afternoon at 1420 with medical oncology and daughter plans to discuss back pain with that provider. Encouraged daughter to keep this office updated. Daughter provided with this RN's direct number. Daughter verbalized understanding and agreement.  Loma Sousa, RN BSN

## 2018-09-04 NOTE — Progress Notes (Signed)
Chemotherapy teaching pulled together and patient taught during office visit. Patient's family is at bedside to include Olivia Mackie, her daughter and her 3 sisters. Chemotherapy medication Alimta explained, side effects reviewed, when to call the clinic reviewed and the importance of infection prevention. What to do during and after chemotherapy reviewed. Nausea vomiting and diarrhea sheets reviewed and provided for patient.  Explanation given on taking Folic Acid 1 mg daily and need for B-12 shots every 9 weeks.     Questions were answered to patient's satisfaction.   Theresa Vincent presents today for injection per MD orders. B12 1000 mcg administered IM in right deltoid. Administration without incident. Patient tolerated well. Patient was discharged ambulatory with walker and in stable condition with family .  Patient to follow-up tomorrow for PICC line placement and cycle 1 chemotherapy.

## 2018-09-04 NOTE — Progress Notes (Signed)
Diagnosis Non-small cell lung cancer, right (HCC) - Plan: Insert PICC line, CBC with Differential, Comprehensive metabolic panel, lidocaine-prilocaine (EMLA) cream, prochlorperazine (COMPAZINE) 10 MG tablet, ondansetron (ZOFRAN) 8 MG tablet, dexamethasone (DECADRON) 4 MG tablet, DISCONTINUED: lidocaine-prilocaine (EMLA) cream, DISCONTINUED: prochlorperazine (COMPAZINE) 10 MG tablet, DISCONTINUED: ondansetron (ZOFRAN) 8 MG tablet, DISCONTINUED: dexamethasone (DECADRON) 4 MG tablet  Brain metastases (HCC) - Plan: CBC with Differential, Comprehensive metabolic panel, lidocaine-prilocaine (EMLA) cream, prochlorperazine (COMPAZINE) 10 MG tablet, ondansetron (ZOFRAN) 8 MG tablet, dexamethasone (DECADRON) 4 MG tablet, DISCONTINUED: lidocaine-prilocaine (EMLA) cream, DISCONTINUED: prochlorperazine (COMPAZINE) 10 MG tablet, DISCONTINUED: ondansetron (ZOFRAN) 8 MG tablet, DISCONTINUED: dexamethasone (DECADRON) 4 MG tablet  Staging Cancer Staging No matching staging information was found for the patient.  Assessment and Plan:  Non-small cell lung cancer, right ([place]) 1.  Stage IVa (T1BN2M1B) poorly differentiated adenocarcinoma of the right lung with extensive brain metastasis: - Presentation with 2 to 36-month history of right submandibular mass, seen by Dr. Benjamine Mola.  Flexible fiberoptic laryngoscopy showed normal examination. - She underwent right submandibular lymph node biopsy on 06/26/2018 which showed metastatic poorly differentiated adenocarcinoma, TTF-1 positive, CK positive. - Patient is a current active smoker, quit few weeks ago, smoked about half pack per day for more than 25 years. -PET CT scan on 07/15/2018 shows hypermetabolic 1.8 cm right upper lobe pulmonary nodule, suspicious for primary bronchus and carcinoma, ipsilateral hypermetabolic mediastinal adenopathy consistent with metastatic disease, 2.3 cm hypermetabolic right submandibular lymph node. - Patient had Brain MRI dated 07/24/2018 which showed  extremely widespread metastatic disease to the brain, more than 40 meta stasis, largest measuring 2.5 cm.  Associated vasogenic edema is more pronounced in the cerebellum. She was treated with decadron.  She is no longer on decadron.  She completed RT on 08/15/2018.    -Foundation 1 testing is pending.  I have asked for navigator to discuss with pt and family timeframe for results.  Questions answered.  She is complaining of worsening back pain.  Based on adenocarcinoma histology in advanced lung cancer,  I discussed option of  initial therapy with Alimta 500 mg/m2 to begin this week to assess tolerance of therapy.  Pt will follow-up with Dr. Worthy Keeler to discuss results of molecular testing and additional therapy may be recommended.  Side effects of Alimta discussed with pt.  She will receive B12 today in clinic and will continue Folic acid as directed.  Decadron as directed.  She will follow-up with Dr. Worthy Keeler in 2-3 weeks to go over results of testing.  Pt is set up for PICC line to facilitate chemotherapy.    2.  Back pain.  Due to volume of disease will begin therapy initially with Alimta.  She is prescribed Vicodin 1 po every 4 hours prn.  I have discussed with pt to avoid NSAIDS once therapy begins.     3  Hypothyroidism.  Pt on synthroid.  Follow-up with PCP for monitoring.      4.  Headache.  Pt has known brain mets.  She is no longer on decadron.  Vicodin RX.  Pt advised to notify the office if symptoms worsen.    Current Status:  Pt is seen today for follow-up.  She is here to discuss testing results.  She is complaining of back pain.      Non-small cell lung cancer, right ([place])   07/16/2018 Initial Diagnosis    Non-small cell lung cancer, right ([place])    09/03/2018 -  Chemotherapy    The patient had PEMEtrexed (ALIMTA)  850 mg in sodium chloride 0.9 % 100 mL chemo infusion, 500 mg/m2, Intravenous,  Once, 0 of 6 cycles  for chemotherapy treatment.      Brain metastases ([place])    08/01/2018 Initial Diagnosis    Brain metastases ([place])    09/03/2018 -  Chemotherapy    The patient had PEMEtrexed (ALIMTA) 850 mg in sodium chloride 0.9 % 100 mL chemo infusion, 500 mg/m2, Intravenous,  Once, 0 of 6 cycles  for chemotherapy treatment.       Problem List Patient Active Problem List   Diagnosis Date Noted  . Brain metastases ([place]) [C79.31] 08/01/2018  . Non-small cell lung cancer, right ([place]) [C34.91] 07/16/2018  . Screening for colorectal cancer [Z12.11, Z12.12] 04/23/2018  . Goals of care, counseling/discussion [Z71.89] 04/23/2018  . LLQ pain [R10.32] 04/08/2018  . Vaginal atrophy [N95.2] 04/08/2018  . BV (bacterial vaginosis) [N76.0, B96.89] 04/08/2018  . Vaginal odor [N89.8] 04/08/2018  . Vaginal discharge [N89.8] 04/08/2018  . Hypothyroidism following radioiodine therapy [E89.0] 01/13/2016  . Prediabetes [R73.03] 01/13/2016  . Fracture of fourth toe, right, closed [S92.501A] 06/08/2014  . Wrist fracture [S62.109A] 05/02/2011  . FRACTURE, RADIUS, DISTAL [S52.599A] 03/06/2011  . BLOOD IN STOOL [K92.1] 07/13/2009  . ARTHRITIS, BACK [M47.9] 07/13/2009    Past Medical History Past Medical History:  Diagnosis Date  . Arthritis    in back   . Diverticula of colon   . Diverticulosis   . Hemorrhoid   . Thyroid disease    was hyperthyroid had frozen and now on meds    Past Surgical History Past Surgical History:  Procedure Laterality Date  . Broken toe screw in toe    . EYE SURGERY  03/20/16  . pt had her bladder stretched in the past     Dr. Michela Pitcher   . radiation thyroid    . WRIST SURGERY Right     Family History Family History  Problem Relation Age of Onset  . Diabetes Mother   . Heart disease Father   . Diabetes Father   . Diabetes Sister   . Diabetes Sister   . Cancer Sister        breast     Social History  reports that she has quit smoking. She smoked 0.00 packs per day for 45.00 years. She has never used smokeless tobacco. She reports  that she drinks alcohol. She reports that she does not use drugs.  Medications  Current Outpatient Medications:  .  acetaminophen (TYLENOL) 500 MG tablet, Take 500 mg by mouth daily as needed for moderate pain or headache., Disp: , Rfl:  .  levothyroxine (SYNTHROID, LEVOTHROID) 25 MCG tablet, Take 1 tablet (25 mcg total) by mouth daily before breakfast., Disp: 90 tablet, Rfl: 0 .  pseudoephedrine-acetaminophen (TYLENOL SINUS) 30-500 MG TABS tablet, Take 1 tablet by mouth daily as needed (allergies)., Disp: , Rfl:  .  dexamethasone (DECADRON) 4 MG tablet, Take 1 tab two times a day the day before Alimta chemo. Take 2 tabs the day after chemo, then take 2 tabs two times a day for 2 days., Disp: 30 tablet, Rfl: 1 .  folic acid (FOLVITE) 1 MG tablet, Take 1 mg by mouth daily., Disp: , Rfl:  .  lidocaine-prilocaine (EMLA) cream, Apply to affected area once, Disp: 30 g, Rfl: 3 .  Misc. Devices MISC, Please supply one wheelchair for patient., Disp: 1 each, Rfl: 0 .  ondansetron (ZOFRAN) 8 MG tablet, Take 1 tablet (8 mg total) by mouth 2 (  two) times daily as needed (Nausea or vomiting)., Disp: 30 tablet, Rfl: 1 .  PEMEtrexed Disodium (ALIMTA IV), Inject into the vein every 21 ( twenty-one) days., Disp: , Rfl:  .  prochlorperazine (COMPAZINE) 10 MG tablet, Take 1 tablet (10 mg total) by mouth every 6 (six) hours as needed (Nausea or vomiting)., Disp: 30 tablet, Rfl: 1  Allergies Avelox [moxifloxacin hcl in nacl]  Review of Systems Review of Systems - Oncology ROS negative other than back pain, headache   Physical Exam  Vitals Wt Readings from Last 3 Encounters:  09/04/18 144 lb 6.4 oz (65.5 kg)  08/08/18 139 lb (63 kg)  08/01/18 138 lb 12.8 oz (63 kg)   Temp Readings from Last 3 Encounters:  09/04/18 98.2 F (36.8 C) (Oral)  08/08/18 98.5 F (36.9 C) (Oral)  08/01/18 98.4 F (36.9 C) (Oral)   BP Readings from Last 3 Encounters:  09/04/18 123/70  08/08/18 (!) 155/86  08/01/18 (!)  135/92   Pulse Readings from Last 3 Encounters:  09/04/18 (!) 107  08/08/18 81  08/01/18 (!) 54    Constitutional: Well-developed, well-nourished, and in no distress.   HENT: Head: Normocephalic and atraumatic.  Mouth/Throat: No oropharyngeal exudate. Mucosa moist. Eyes: Pupils are equal, round, and reactive to light. Conjunctivae are normal. No scleral icterus.  Neck: Normal range of motion. Neck supple. No JVD present.  Cardiovascular: Normal rate, regular rhythm and normal heart sounds.  Exam reveals no gallop and no friction rub.   No murmur heard. Pulmonary/Chest: Effort normal and breath sounds normal. No respiratory distress. No wheezes.No rales.  Abdominal: Soft. Bowel sounds are normal. No distension. There is no tenderness. There is no guarding.  Musculoskeletal: No edema or tenderness.  Lymphadenopathy: No cervical, axillary or supraclavicular adenopathy.  Neurological: Alert and oriented to person, place, and time. No cranial nerve deficit.  Skin: Skin is warm and dry. No rash noted. No erythema. No pallor.  Psychiatric: Affect and judgment normal.   Labs No visits with results within 3 Day(s) from this visit.  Latest known visit with results is:  Hospital Outpatient Visit on 08/08/2018  Component Date Value Ref Range Status  . Prothrombin Time 08/08/2018 12.6  11.4 - 15.2 seconds Final  . INR 08/08/2018 0.95   Final   Performed at [facility] [address] [place], [place] [address]     Pathology Orders Placed This Encounter  Procedures  . CBC with Differential    Standing Status:   Standing    Number of Occurrences:   20    Standing Expiration Date:   09/05/2019  . Comprehensive metabolic panel    Standing Status:   Standing    Number of Occurrences:   20    Standing Expiration Date:   09/05/2019       Zoila Shutter MD

## 2018-09-05 ENCOUNTER — Ambulatory Visit (HOSPITAL_COMMUNITY)
Admission: RE | Admit: 2018-09-05 | Discharge: 2018-09-05 | Disposition: A | Discharge status: Home / Self Care | Payer: Medicare Other | Source: Ambulatory Visit | Attending: Internal Medicine | Admitting: Internal Medicine

## 2018-09-05 ENCOUNTER — Inpatient Hospital Stay (HOSPITAL_COMMUNITY): Payer: Medicare Other

## 2018-09-05 ENCOUNTER — Other Ambulatory Visit (HOSPITAL_COMMUNITY): Payer: Self-pay | Admitting: *Deleted

## 2018-09-05 ENCOUNTER — Other Ambulatory Visit (HOSPITAL_COMMUNITY): Payer: Self-pay | Admitting: Internal Medicine

## 2018-09-05 VITALS — BP 133/83 | HR 80 | Temp 98.1°F | Resp 16

## 2018-09-05 DIAGNOSIS — Z5111 Encounter for antineoplastic chemotherapy: Secondary | ICD-10-CM | POA: Diagnosis not present

## 2018-09-05 DIAGNOSIS — C7931 Secondary malignant neoplasm of brain: Secondary | ICD-10-CM

## 2018-09-05 DIAGNOSIS — R51 Headache: Secondary | ICD-10-CM | POA: Diagnosis not present

## 2018-09-05 DIAGNOSIS — M545 Low back pain: Secondary | ICD-10-CM | POA: Diagnosis not present

## 2018-09-05 DIAGNOSIS — E039 Hypothyroidism, unspecified: Secondary | ICD-10-CM | POA: Diagnosis not present

## 2018-09-05 DIAGNOSIS — C3491 Malignant neoplasm of unspecified part of right bronchus or lung: Secondary | ICD-10-CM

## 2018-09-05 DIAGNOSIS — C3411 Malignant neoplasm of upper lobe, right bronchus or lung: Secondary | ICD-10-CM | POA: Diagnosis not present

## 2018-09-05 LAB — CBC WITH DIFFERENTIAL/PLATELET
BASOS ABS: 0 10*3/uL (ref 0.0–0.1)
Basophils Relative: 0 %
EOS PCT: 0 %
Eosinophils Absolute: 0 10*3/uL (ref 0.0–0.7)
HCT: 37.8 % (ref 36.0–46.0)
Hemoglobin: 12.7 g/dL (ref 12.0–15.0)
LYMPHS ABS: 1.2 10*3/uL (ref 0.7–4.0)
Lymphocytes Relative: 13 %
MCH: 29.4 pg (ref 26.0–34.0)
MCHC: 33.6 g/dL (ref 30.0–36.0)
MCV: 87.5 fL (ref 78.0–100.0)
MONOS PCT: 7 %
Monocytes Absolute: 0.6 10*3/uL (ref 0.1–1.0)
NEUTROS PCT: 80 %
Neutro Abs: 7.1 10*3/uL (ref 1.7–7.7)
PLATELETS: 263 10*3/uL (ref 150–400)
RBC: 4.32 MIL/uL (ref 3.87–5.11)
RDW: 15.2 % (ref 11.5–15.5)
WBC: 8.9 10*3/uL (ref 4.0–10.5)

## 2018-09-05 LAB — COMPREHENSIVE METABOLIC PANEL
ALBUMIN: 3.5 g/dL (ref 3.5–5.0)
ALK PHOS: 78 U/L (ref 38–126)
ALT: 28 U/L (ref 0–44)
ANION GAP: 11 (ref 5–15)
AST: 19 U/L (ref 15–41)
BUN: 13 mg/dL (ref 8–23)
CALCIUM: 9.3 mg/dL (ref 8.9–10.3)
CHLORIDE: 98 mmol/L (ref 98–111)
CO2: 27 mmol/L (ref 22–32)
Creatinine, Ser: 0.36 mg/dL — ABNORMAL LOW (ref 0.44–1.00)
GFR calc Af Amer: >60 mL/min (ref >60)
GFR calc non Af Amer: >60 mL/min (ref >60)
GLUCOSE: 98 mg/dL (ref 70–99)
Potassium: 3.8 mmol/L (ref 3.5–5.1)
Sodium: 136 mmol/L (ref 135–145)
Total Bilirubin: 0.5 mg/dL (ref 0.3–1.2)
Total Protein: 6.8 g/dL (ref 6.5–8.1)

## 2018-09-05 MED ORDER — PROCHLORPERAZINE MALEATE 10 MG PO TABS
ORAL_TABLET | ORAL | Status: AC
  Filled 2018-09-05: qty 1

## 2018-09-05 MED ORDER — SODIUM CHLORIDE 0.9% FLUSH: 10.0000 mL | Freq: Two times a day (BID) | INTRAVENOUS | Status: DC

## 2018-09-05 MED ORDER — SODIUM CHLORIDE 0.9% FLUSH: 10.0000 mL | INTRAVENOUS | Status: DC | PRN

## 2018-09-05 MED ORDER — SODIUM CHLORIDE 0.9 % IV SOLN
Freq: Once | INTRAVENOUS | Status: AC
  Administered 2018-09-05: 13:00:00 via INTRAVENOUS

## 2018-09-05 MED ORDER — HEPARIN SOD (PORK) LOCK FLUSH 100 UNIT/ML IV SOLN
250.0000 [IU] | Freq: Once | INTRAVENOUS | Status: AC | PRN
  Administered 2018-09-05: 250 [IU]

## 2018-09-05 MED ORDER — SODIUM CHLORIDE 0.9% FLUSH
10.0000 mL | INTRAVENOUS | Status: DC | PRN
  Administered 2018-09-05: 10 mL
  Filled 2018-09-05: qty 10

## 2018-09-05 MED ORDER — SODIUM CHLORIDE 0.9 % IV SOLN
500.0000 mg/m2 | Freq: Once | INTRAVENOUS | Status: AC
  Administered 2018-09-05: 850 mg via INTRAVENOUS
  Filled 2018-09-05: qty 20

## 2018-09-05 MED ORDER — PROCHLORPERAZINE MALEATE 10 MG PO TABS
10.0000 mg | ORAL_TABLET | Freq: Once | ORAL | Status: AC
  Administered 2018-09-05: 10 mg via ORAL

## 2018-09-05 NOTE — Patient Instructions (Signed)
[facility] Discharge Instructions for Patients Receiving Chemotherapy   Beginning January 23rd 2017 lab work for the Cancer Center will be done in the  Main lab at [facility] on 1st floor. If you have a lab appointment with the Cancer Center please come in thru the  Main Entrance and check in at the main information desk   Today you received the following chemotherapy agents   To help prevent nausea and vomiting after your treatment, we encourage you to take your nausea medication     If you develop nausea and vomiting, or diarrhea that is not controlled by your medication, call the clinic.  The clinic phone number is [phone]. Office hours are Monday-Friday 8:30am-5:00pm.  BELOW ARE SYMPTOMS THAT SHOULD BE REPORTED IMMEDIATELY:  *FEVER GREATER THAN 101.0 F  *CHILLS WITH OR WITHOUT FEVER  NAUSEA AND VOMITING THAT IS NOT CONTROLLED WITH YOUR NAUSEA MEDICATION  *UNUSUAL SHORTNESS OF BREATH  *UNUSUAL BRUISING OR BLEEDING  TENDERNESS IN MOUTH AND THROAT WITH OR WITHOUT PRESENCE OF ULCERS  *URINARY PROBLEMS  *BOWEL PROBLEMS  UNUSUAL RASH Items with * indicate a potential emergency and should be followed up as soon as possible. If you have an emergency after office hours please contact your primary care physician or go to the nearest emergency department.  Please call the clinic during office hours if you have any questions or concerns.   You may also contact the Patient Navigator at [phone] should you have any questions or need assistance in obtaining follow up care.      Resources For Cancer Patients and their Caregivers ? American Cancer Society: Can assist with transportation, wigs, general needs, runs Look Good Feel Better.        1-888-227-6333 ? Cancer Care: Provides financial assistance, online support groups, medication/co-pay assistance.  1-800-813-HOPE (4673) ? [facility] Assists [place] cancer  patients and their families through emotional , educational and financial support.  336-427-4357 ? [place] DSS Where to apply for food stamps, Medicaid and utility assistance. 336-342-1394 ? [facility]: Transportation to medical appointments. 336-347-2287 ? Social Security Administration: May apply for disability if have a Stage IV cancer. 336-342-7796 1-800-772-1213 ? [place] Aging, Disability and Transit Services: Assists with nutrition, care and transit needs. 336-349-2343         

## 2018-09-05 NOTE — Progress Notes (Signed)
Consent obtained today for Alimta. Patient had teaching done yesterday. Re-iterated instruction about the decadron.  Labs done and chemotherapy released.   Treatment given per orders. Patient tolerated it well without problems. Vitals stable and discharged home from clinic via wheelchair.  Follow up as scheduled.

## 2018-09-05 NOTE — Progress Notes (Signed)
Peripherally Inserted Central Catheter/Midline Placement  The IV Nurse has discussed with the patient and/or persons authorized to consent for the patient, the purpose of this procedure and the potential benefits and risks involved with this procedure.  The benefits include less needle sticks, lab draws from the catheter, and the patient may be discharged home with the catheter. Risks include, but not limited to, infection, bleeding, blood clot (thrombus formation), and puncture of an artery; nerve damage and irregular heartbeat and possibility to perform a PICC exchange if needed/ordered by physician.  Alternatives to this procedure were also discussed.  [employer] PICC patient education guide, fact sheet on infection prevention and patient information card has been provided to patient /or left at bedside.    PICC/Midline Placement Documentation  PICC Single Lumen 09/05/18 PICC Right Brachial 36 cm 0 cm (Active)  Indication for Insertion or Continuance of Line Home intravenous therapies (PICC only) 09/05/2018 10:48 AM  Exposed Catheter (cm) 0 cm 09/05/2018 10:48 AM  Site Assessment Clean;Dry;Intact 09/05/2018 10:48 AM  Line Status Flushed;Saline locked;Blood return noted 09/05/2018 10:48 AM  Dressing Type Transparent;Securing device 09/05/2018 10:48 AM  Dressing Status Clean;Dry;Intact 09/05/2018 10:48 AM  Dressing Change Due 09/12/18 09/05/2018 10:48 AM   PICC was placed by Claretha Cooper RN    Leanor, Voris 09/05/2018, 10:50 AM

## 2018-09-05 NOTE — Addendum Note (Signed)
Addended by: Donetta Potts on: 09/05/2018 08:43 AM   Modules accepted: Orders

## 2018-09-06 ENCOUNTER — Ambulatory Visit (HOSPITAL_COMMUNITY): Payer: Medicare Other

## 2018-09-09 ENCOUNTER — Other Ambulatory Visit: Payer: Self-pay

## 2018-09-09 ENCOUNTER — Encounter (HOSPITAL_COMMUNITY): Payer: Self-pay

## 2018-09-09 ENCOUNTER — Inpatient Hospital Stay (HOSPITAL_COMMUNITY): Payer: Medicare Other

## 2018-09-09 DIAGNOSIS — R51 Headache: Secondary | ICD-10-CM | POA: Diagnosis not present

## 2018-09-09 DIAGNOSIS — M545 Low back pain: Secondary | ICD-10-CM | POA: Diagnosis not present

## 2018-09-09 DIAGNOSIS — E039 Hypothyroidism, unspecified: Secondary | ICD-10-CM | POA: Diagnosis not present

## 2018-09-09 DIAGNOSIS — C3411 Malignant neoplasm of upper lobe, right bronchus or lung: Secondary | ICD-10-CM | POA: Diagnosis not present

## 2018-09-09 DIAGNOSIS — C7931 Secondary malignant neoplasm of brain: Secondary | ICD-10-CM | POA: Diagnosis not present

## 2018-09-09 DIAGNOSIS — Z5111 Encounter for antineoplastic chemotherapy: Secondary | ICD-10-CM | POA: Diagnosis not present

## 2018-09-09 MED ORDER — HEPARIN SOD (PORK) LOCK FLUSH 100 UNIT/ML IV SOLN
300.0000 [IU] | Freq: Once | INTRAVENOUS | Status: AC
  Administered 2018-09-09: 300 [IU] via INTRAVENOUS

## 2018-09-09 MED ORDER — SODIUM CHLORIDE 0.9% FLUSH
10.0000 mL | Freq: Once | INTRAVENOUS | Status: AC
  Administered 2018-09-09: 10 mL via INTRAVENOUS

## 2018-09-09 NOTE — Progress Notes (Signed)
Theresa Vincent presented for PICC line flush. PICC line located right upper arm Good blood return present. PICC line flushed with 41ml NS and 300U/46ml Heparin. Procedure without incident. Patient tolerated procedure well.  . Vitals stable and discharged home from clinic ambulatory. Follow up as scheduled.

## 2018-09-10 ENCOUNTER — Other Ambulatory Visit: Payer: Self-pay | Admitting: Radiology

## 2018-09-10 NOTE — Telephone Encounter (Signed)
Spoke with the pts daughter medication sent in

## 2018-09-11 ENCOUNTER — Other Ambulatory Visit: Payer: Self-pay

## 2018-09-11 ENCOUNTER — Ambulatory Visit (HOSPITAL_COMMUNITY)
Admission: RE | Admit: 2018-09-11 | Discharge: 2018-09-11 | Disposition: A | Discharge status: Home / Self Care | Payer: Medicare Other | Source: Ambulatory Visit | Attending: Internal Medicine | Admitting: Internal Medicine

## 2018-09-11 ENCOUNTER — Encounter (HOSPITAL_COMMUNITY): Payer: Self-pay

## 2018-09-11 ENCOUNTER — Other Ambulatory Visit: Payer: Self-pay | Admitting: "Endocrinology

## 2018-09-11 DIAGNOSIS — Z5111 Encounter for antineoplastic chemotherapy: Secondary | ICD-10-CM | POA: Diagnosis not present

## 2018-09-11 DIAGNOSIS — C7931 Secondary malignant neoplasm of brain: Secondary | ICD-10-CM | POA: Diagnosis not present

## 2018-09-11 DIAGNOSIS — M1990 Unspecified osteoarthritis, unspecified site: Secondary | ICD-10-CM | POA: Diagnosis not present

## 2018-09-11 DIAGNOSIS — Z87891 Personal history of nicotine dependence: Secondary | ICD-10-CM | POA: Diagnosis not present

## 2018-09-11 DIAGNOSIS — E079 Disorder of thyroid, unspecified: Secondary | ICD-10-CM | POA: Insufficient documentation

## 2018-09-11 DIAGNOSIS — C3490 Malignant neoplasm of unspecified part of unspecified bronchus or lung: Secondary | ICD-10-CM | POA: Diagnosis not present

## 2018-09-11 DIAGNOSIS — C7951 Secondary malignant neoplasm of bone: Secondary | ICD-10-CM | POA: Insufficient documentation

## 2018-09-11 DIAGNOSIS — K5730 Diverticulosis of large intestine without perforation or abscess without bleeding: Secondary | ICD-10-CM | POA: Insufficient documentation

## 2018-09-11 DIAGNOSIS — C3491 Malignant neoplasm of unspecified part of right bronchus or lung: Secondary | ICD-10-CM

## 2018-09-11 HISTORY — PX: IR IMAGING GUIDED PORT INSERTION: IMG5740

## 2018-09-11 LAB — CBC
HCT: 40.1 % (ref 36.0–46.0)
HEMOGLOBIN: 13.1 g/dL (ref 12.0–15.0)
MCH: 28.8 pg (ref 26.0–34.0)
MCHC: 32.7 g/dL (ref 30.0–36.0)
MCV: 88.1 fL (ref 78.0–100.0)
Platelets: 211 10*3/uL (ref 150–400)
RBC: 4.55 MIL/uL (ref 3.87–5.11)
RDW: 14.8 % (ref 11.5–15.5)
WBC: 4 10*3/uL (ref 4.0–10.5)

## 2018-09-11 LAB — BASIC METABOLIC PANEL
ANION GAP: 14 (ref 5–15)
BUN: 9 mg/dL (ref 8–23)
CO2: 26 mmol/L (ref 22–32)
Calcium: 9 mg/dL (ref 8.9–10.3)
Chloride: 96 mmol/L — ABNORMAL LOW (ref 98–111)
Creatinine, Ser: 0.56 mg/dL (ref 0.44–1.00)
GFR calc Af Amer: >60 mL/min (ref >60)
GLUCOSE: 136 mg/dL — AB (ref 70–99)
Potassium: 4.2 mmol/L (ref 3.5–5.1)
SODIUM: 136 mmol/L (ref 135–145)

## 2018-09-11 LAB — PROTIME-INR
INR: 0.98
PROTHROMBIN TIME: 12.9 s (ref 11.4–15.2)

## 2018-09-11 MED ORDER — CEFAZOLIN SODIUM-DEXTROSE 2-4 GM/100ML-% IV SOLN
2.0000 g | INTRAVENOUS | Status: AC
  Administered 2018-09-11: 2 g via INTRAVENOUS

## 2018-09-11 MED ORDER — SODIUM CHLORIDE 0.9 % IV SOLN: INTRAVENOUS | Status: DC

## 2018-09-11 MED ORDER — FENTANYL CITRATE (PF) 100 MCG/2ML IJ SOLN
INTRAMUSCULAR | Status: AC
  Filled 2018-09-11: qty 4

## 2018-09-11 MED ORDER — LIDOCAINE-EPINEPHRINE (PF) 1 %-1:200000 IJ SOLN
INTRAMUSCULAR | Status: AC
  Filled 2018-09-11: qty 30

## 2018-09-11 MED ORDER — HEPARIN SOD (PORK) LOCK FLUSH 100 UNIT/ML IV SOLN
INTRAVENOUS | Status: AC
  Filled 2018-09-11: qty 5

## 2018-09-11 MED ORDER — FENTANYL CITRATE (PF) 100 MCG/2ML IJ SOLN
INTRAMUSCULAR | Status: AC | PRN
  Administered 2018-09-11: 50 ug via INTRAVENOUS

## 2018-09-11 MED ORDER — MIDAZOLAM HCL 2 MG/2ML IJ SOLN
INTRAMUSCULAR | Status: AC | PRN
  Administered 2018-09-11: 1 mg via INTRAVENOUS

## 2018-09-11 MED ORDER — MIDAZOLAM HCL 2 MG/2ML IJ SOLN
INTRAMUSCULAR | Status: AC
  Filled 2018-09-11: qty 4

## 2018-09-11 MED ORDER — LIDOCAINE-EPINEPHRINE 2 %-1:100000 IJ SOLN
INTRAMUSCULAR | Status: AC | PRN
  Administered 2018-09-11: 20 mL

## 2018-09-11 MED ORDER — CEFAZOLIN SODIUM-DEXTROSE 2-4 GM/100ML-% IV SOLN
INTRAVENOUS | Status: AC
  Filled 2018-09-11: qty 100

## 2018-09-11 NOTE — Discharge Instructions (Addendum)
Implanted Port Insertion, Care After °This sheet gives you information about how to care for yourself after your procedure. Your health care provider may also give you more specific instructions. If you have problems or questions, contact your health care provider. °What can I expect after the procedure? °After your procedure, it is common to have: °· Discomfort at the port insertion site. °· Bruising on the skin over the port. This should improve over 3-4 days. ° °Follow these instructions at home: °Port care °· After your port is placed, you will get a manufacturer's information card. The card has information about your port. Keep this card with you at all times. °· Take care of the port as told by your health care provider. Ask your health care provider if you or a family member can get training for taking care of the port at home. A home health care nurse may also take care of the port. °· Make sure to remember what type of port you have. °Incision care °· Follow instructions from your health care provider about how to take care of your port insertion site. Make sure you: °? Wash your hands with soap and water before you change your bandage (dressing). If soap and water are not available, use hand sanitizer. °? Change your dressing as told by your health care provider. °? Leave stitches (sutures), skin glue, or adhesive strips in place. These skin closures may need to stay in place for 2 weeks or longer. If adhesive strip edges start to loosen and curl up, you may trim the loose edges. Do not remove adhesive strips completely unless your health care provider tells you to do that. °· Check your port insertion site every day for signs of infection. Check for: °? More redness, swelling, or pain. °? More fluid or blood. °? Warmth. °? Pus or a bad smell. °General instructions °· Do not take baths, swim, or use a hot tub until your health care provider approves. °· Do not lift anything that is heavier than 10 lb (4.5  kg) for a week, or as told by your health care provider. °· Ask your health care provider when it is okay to: °? Return to work or school. °? Resume usual physical activities or sports. °· Do not drive for 24 hours if you were given a medicine to help you relax (sedative). °· Take over-the-counter and prescription medicines only as told by your health care provider. °· Wear a medical alert bracelet in case of an emergency. This will tell any health care providers that you have a port. °· Keep all follow-up visits as told by your health care provider. This is important. °Contact a health care provider if: °· You cannot flush your port with saline as directed, or you cannot draw blood from the port. °· You have a fever or chills. °· You have more redness, swelling, or pain around your port insertion site. °· You have more fluid or blood coming from your port insertion site. °· Your port insertion site feels warm to the touch. °· You have pus or a bad smell coming from the port insertion site. °Get help right away if: °· You have chest pain or shortness of breath. °· You have bleeding from your port that you cannot control. °Summary °· Take care of the port as told by your health care provider. °· Change your dressing as told by your health care provider. °· Keep all follow-up visits as told by your health care provider. °  This information is not intended to replace advice given to you by your health care provider. Make sure you discuss any questions you have with your health care provider. °Document Released: 10/01/2013 Document Revised: 11/01/2016 Document Reviewed: 11/01/2016 °Elsevier Interactive Patient Education © 2017 [employer] °Moderate Conscious Sedation, Adult, Care After °These instructions provide you with information about caring for yourself after your procedure. Your health care provider may also give you more specific instructions. Your treatment has been planned according to current medical  practices, but problems sometimes occur. Call your health care provider if you have any problems or questions after your procedure. °What can I expect after the procedure? °After your procedure, it is common: °· To feel sleepy for several hours. °· To feel clumsy and have poor balance for several hours. °· To have poor judgment for several hours. °· To vomit if you eat too soon. ° °Follow these instructions at home: °For at least 24 hours after the procedure: ° °· Do not: °? Participate in activities where you could fall or become injured. °? Drive. °? Use heavy machinery. °? Drink alcohol. °? Take sleeping pills or medicines that cause drowsiness. °? Make important decisions or sign legal documents. °? Take care of children on your own. °· Rest. °Eating and drinking °· Follow the diet recommended by your health care provider. °· If you vomit: °? Drink water, juice, or soup when you can drink without vomiting. °? Make sure you have little or no nausea before eating solid foods. °General instructions °· Have a responsible adult stay with you until you are awake and alert. °· Take over-the-counter and prescription medicines only as told by your health care provider. °· If you smoke, do not smoke without supervision. °· Keep all follow-up visits as told by your health care provider. This is important. °Contact a health care provider if: °· You keep feeling nauseous or you keep vomiting. °· You feel light-headed. °· You develop a rash. °· You have a fever. °Get help right away if: °· You have trouble breathing. °This information is not intended to replace advice given to you by your health care provider. Make sure you discuss any questions you have with your health care provider. °Document Released: 10/01/2013 Document Revised: 05/15/2016 Document Reviewed: 04/01/2016 °Elsevier Interactive Patient Education © 2018 [employer] ° °

## 2018-09-11 NOTE — H&P (Signed)
Chief Complaint: Patient was seen in consultation today for [facility] A Cath placement at the request of Higgs,Vetta  Referring Physician(s): Higgs,Vetta  Supervising Physician: Arne Cleveland  Patient Status: [facility] - Out-pt  History of Present Illness: Theresa Vincent is a 67 y.o. female   Lung Ca Brain metastasis Submandibular mass bx 06/26/18 showed metastatic poorly undifferentiated adenocarcinoma One dose chemo done 9/10; next dose 09/24/18 Now scheduled for [facility] a cath placement  Past Medical History:  Diagnosis Date  . Arthritis    in back   . Diverticula of colon   . Diverticulosis   . Hemorrhoid   . Thyroid disease    was hyperthyroid had frozen and now on meds    Past Surgical History:  Procedure Laterality Date  . Broken toe screw in toe    . EYE SURGERY  03/20/16  . pt had her bladder stretched in the past     Dr. Michela Pitcher   . radiation thyroid    . WRIST SURGERY Right     Allergies: Avelox [moxifloxacin hcl in nacl]  Medications: Prior to Admission medications   Medication Sig Start Date End Date Taking? Authorizing Provider  acetaminophen (TYLENOL) 500 MG tablet Take 500 mg by mouth daily as needed for moderate pain or headache.   Yes [provider]  dexamethasone (DECADRON) 4 MG tablet Take 1 tab two times a day the day before Alimta chemo. Take 2 tabs the day after chemo, then take 2 tabs two times a day for 2 days. 09/04/18  Yes Higgs, Mathis Dad, MD  folic acid (FOLVITE) 1 MG tablet Take 1 mg by mouth daily.   Yes [provider]  HYDROcodone-Acetaminophen (VICODIN) 5-300 MG TABS Take 1 tablet by mouth every 4 (four) hours as needed. 09/04/18  Yes Higgs, Mathis Dad, MD  ibuprofen (ADVIL,MOTRIN) 200 MG tablet Take 400 mg by mouth every 6 (six) hours as needed (pain / headache).   Yes [provider]  levothyroxine (SYNTHROID, LEVOTHROID) 25 MCG tablet Take 1 tablet (25 mcg total) by mouth daily before breakfast. 06/14/18  Yes Nida,  Marella Chimes, MD  ondansetron (ZOFRAN) 8 MG tablet Take 1 tablet (8 mg total) by mouth 2 (two) times daily as needed (Nausea or vomiting). 09/04/18  Yes Higgs, Mathis Dad, MD  prochlorperazine (COMPAZINE) 10 MG tablet Take 1 tablet (10 mg total) by mouth every 6 (six) hours as needed (Nausea or vomiting). 09/04/18  Yes Higgs, Mathis Dad, MD  pseudoephedrine-acetaminophen (TYLENOL SINUS) 30-500 MG TABS tablet Take 1 tablet by mouth daily as needed (allergies).   Yes [provider]  lidocaine-prilocaine (EMLA) cream Apply to affected area once Patient taking differently: Apply 1 application topically daily as needed (local anesthesia). Apply to affected area once 09/04/18   Higgs, Mathis Dad, MD  Misc. Devices MISC Please supply one wheelchair for patient. 09/04/18   Higgs, Mathis Dad, MD  PEMEtrexed Disodium (ALIMTA IV) Inject into the vein every 21 ( twenty-one) days.    [provider]     Family History  Problem Relation Age of Onset  . Diabetes Mother   . Heart disease Father   . Diabetes Father   . Diabetes Sister   . Diabetes Sister   . Cancer Sister        breast    Social History   Socioeconomic History  . Marital status: Legally Separated    Spouse name: Not on file  . Number of children: Not on file  . Years of education: hs   .  Highest education level: Not on file  Occupational History    Employer: [employer]  . Financial resource strain: Not on file  . Food insecurity:    Worry: Not on file    Inability: Not on file  . Transportation needs:    Medical: Not on file    Non-medical: Not on file  Tobacco Use  . Smoking status: Former Smoker    Packs/day: 0.00    Years: 45.00    Pack years: 0.00  . Smokeless tobacco: Never Used  Substance and Sexual Activity  . Alcohol use: Yes    Comment: rarely   . Drug use: No  . Sexual activity: Not Currently    Birth control/protection: Post-menopausal  Lifestyle  . Physical activity:    Days per  week: Not on file    Minutes per session: Not on file  . Stress: Not on file  Relationships  . Social connections:    Talks on phone: Not on file    Gets together: Not on file    Attends religious service: Not on file    Active member of club or organization: Not on file    Attends meetings of clubs or organizations: Not on file    Relationship status: Not on file  Other Topics Concern  . Not on file  Social History Narrative  . Not on file    Review of Systems: A 12 point ROS discussed and pertinent positives are indicated in the HPI above.  All other systems are negative.  Review of Systems  Constitutional: Positive for activity change and fatigue. Negative for fever.  Respiratory: Negative for shortness of breath.   Cardiovascular: Negative for chest pain.  Gastrointestinal: Negative for abdominal pain.  Neurological: Positive for weakness.  Psychiatric/Behavioral: Negative for behavioral problems and confusion.    Vital Signs: BP 111/74   Pulse (!) 106   Temp 98.2 F (36.8 C) (Oral)   Ht 5\' 2"  (1.575 m)   Wt 144 lb (65.3 kg)   SpO2 95%   BMI 26.34 kg/m   Physical Exam  Constitutional: She is oriented to person, place, and time.  Cardiovascular: Normal rate and regular rhythm.  Pulmonary/Chest: Effort normal and breath sounds normal.  Abdominal: Soft. Bowel sounds are normal.  Musculoskeletal: Normal range of motion.  Neurological: She is alert and oriented to person, place, and time.  Skin: Skin is warm and dry.  Psychiatric: She has a normal mood and affect. Her behavior is normal. Judgment and thought content normal.  Nursing note and vitals reviewed.   Imaging: No results found.  Labs:  CBC: Recent Labs    06/26/18 1229 07/04/18 1549 09/05/18 1147 09/11/18 0846  WBC 10.9* 16.9* 8.9 4.0  HGB 13.7 15.3* 12.7 13.1  HCT 43.7 46.2* 37.8 40.1  PLT 447* 428* 263 211    COAGS: Recent Labs    06/26/18 1229 08/08/18 0603  INR 0.98 0.95     BMP: Recent Labs    07/04/18 1549 09/05/18 1147  NA 138 136  K 3.9 3.8  CL 102 98  CO2 24 27  GLUCOSE 134* 98  BUN 11 13  CALCIUM 9.4 9.3  CREATININE 0.48 0.36*  GFRNONAA >60 >60  GFRAA >60 >60    LIVER FUNCTION TESTS: Recent Labs    07/04/18 1549 09/05/18 1147  BILITOT 0.6 0.5  AST 23 19  ALT 11 28  ALKPHOS 133* 78  PROT 8.3* 6.8  ALBUMIN 4.4 3.5  TUMOR MARKERS: No results for input(s): AFPTM, CEA, CA199, CHROMGRNA in the last 8760 hours.  Assessment and Plan:  Lung Ca Brain mets LAN metastasis Scheduled for PAC placement Risks and benefits of image guided port-a-catheter placement was discussed with the patient including, but not limited to bleeding, infection, pneumothorax, or fibrin sheath development and need for additional procedures.  All of the patient's questions were answered, patient is agreeable to proceed. Consent signed and in chart.   Thank you for this interesting consult.  I greatly enjoyed meeting Theresa Vincent and look forward to participating in their care.  A copy of this report was sent to the requesting provider on this date.  Electronically Signed: Lavonia Drafts, PA-C 09/11/2018, 9:05 AM   I spent a total of    25 Minutes in face to face in clinical consultation, greater than 50% of which was counseling/coordinating care for [facility]

## 2018-09-11 NOTE — Sedation Documentation (Signed)
Removed one 2mg  versed from pyxis, stated removed 2  Count corrected in pyxis by Roe Coombs RN and Valene Bors RN

## 2018-09-11 NOTE — Procedures (Signed)
  Procedure: R IJ Port catheter   EBL:   minimal Complications:  none immediate  See full dictation in [facility] PACS.  D. Keena Dinse MD Main # [phone] Pager  [phone]    

## 2018-09-13 DIAGNOSIS — C3491 Malignant neoplasm of unspecified part of right bronchus or lung: Secondary | ICD-10-CM | POA: Diagnosis not present

## 2018-09-14 ENCOUNTER — Other Ambulatory Visit: Payer: Self-pay

## 2018-09-14 ENCOUNTER — Emergency Department (HOSPITAL_COMMUNITY): Payer: Medicare Other

## 2018-09-14 ENCOUNTER — Encounter (HOSPITAL_COMMUNITY): Payer: Self-pay | Admitting: Emergency Medicine

## 2018-09-14 ENCOUNTER — Emergency Department (HOSPITAL_COMMUNITY)
Admission: EM | Admit: 2018-09-14 | Discharge: 2018-09-14 | Disposition: A | Discharge status: Home / Self Care | Payer: Medicare Other | Source: Home / Self Care | Attending: Emergency Medicine | Admitting: Emergency Medicine

## 2018-09-14 DIAGNOSIS — R112 Nausea with vomiting, unspecified: Secondary | ICD-10-CM | POA: Diagnosis not present

## 2018-09-14 DIAGNOSIS — E860 Dehydration: Secondary | ICD-10-CM

## 2018-09-14 DIAGNOSIS — E890 Postprocedural hypothyroidism: Secondary | ICD-10-CM

## 2018-09-14 DIAGNOSIS — C3491 Malignant neoplasm of unspecified part of right bronchus or lung: Secondary | ICD-10-CM | POA: Diagnosis not present

## 2018-09-14 DIAGNOSIS — R918 Other nonspecific abnormal finding of lung field: Secondary | ICD-10-CM | POA: Diagnosis not present

## 2018-09-14 DIAGNOSIS — G936 Cerebral edema: Secondary | ICD-10-CM | POA: Diagnosis not present

## 2018-09-14 DIAGNOSIS — Z79899 Other long term (current) drug therapy: Secondary | ICD-10-CM

## 2018-09-14 DIAGNOSIS — E871 Hypo-osmolality and hyponatremia: Secondary | ICD-10-CM

## 2018-09-14 DIAGNOSIS — R1110 Vomiting, unspecified: Secondary | ICD-10-CM

## 2018-09-14 DIAGNOSIS — A419 Sepsis, unspecified organism: Secondary | ICD-10-CM | POA: Diagnosis not present

## 2018-09-14 DIAGNOSIS — J189 Pneumonia, unspecified organism: Secondary | ICD-10-CM | POA: Diagnosis not present

## 2018-09-14 DIAGNOSIS — Z87891 Personal history of nicotine dependence: Secondary | ICD-10-CM

## 2018-09-14 DIAGNOSIS — R000 Tachycardia, unspecified: Secondary | ICD-10-CM | POA: Diagnosis not present

## 2018-09-14 DIAGNOSIS — C7931 Secondary malignant neoplasm of brain: Secondary | ICD-10-CM | POA: Diagnosis not present

## 2018-09-14 DIAGNOSIS — J9621 Acute and chronic respiratory failure with hypoxia: Secondary | ICD-10-CM | POA: Diagnosis not present

## 2018-09-14 HISTORY — DX: Malignant (primary) neoplasm, unspecified: C80.1

## 2018-09-14 LAB — CBC WITH DIFFERENTIAL/PLATELET
Basophils Absolute: 0 10*3/uL (ref 0.0–0.1)
Basophils Relative: 0 %
EOS ABS: 0 10*3/uL (ref 0.0–0.7)
EOS PCT: 1 %
HCT: 35.6 % — ABNORMAL LOW (ref 36.0–46.0)
Hemoglobin: 12 g/dL (ref 12.0–15.0)
LYMPHS ABS: 0.3 10*3/uL (ref 0.7–4.0)
Lymphocytes Relative: 10 %
MCH: 29.6 pg (ref 26.0–34.0)
MCHC: 33.7 g/dL (ref 30.0–36.0)
MCV: 87.7 fL (ref 78.0–100.0)
Monocytes Absolute: 0.7 10*3/uL (ref 0.1–1.0)
Monocytes Relative: 24 %
Neutro Abs: 1.8 10*3/uL (ref 1.7–7.7)
Neutrophils Relative %: 65 %
PLATELETS: 186 10*3/uL (ref 150–400)
RBC: 4.06 MIL/uL (ref 3.87–5.11)
RDW: 14.7 % (ref 11.5–15.5)
WBC: 2.9 10*3/uL — AB (ref 4.0–10.5)

## 2018-09-14 LAB — COMPREHENSIVE METABOLIC PANEL
ALT: 20 U/L (ref 0–44)
ANION GAP: 12 (ref 5–15)
AST: 35 U/L (ref 15–41)
Albumin: 3 g/dL — ABNORMAL LOW (ref 3.5–5.0)
Alkaline Phosphatase: 80 U/L (ref 38–126)
BUN: 9 mg/dL (ref 8–23)
CHLORIDE: 95 mmol/L — AB (ref 98–111)
CO2: 24 mmol/L (ref 22–32)
Calcium: 8.4 mg/dL — ABNORMAL LOW (ref 8.9–10.3)
Creatinine, Ser: 0.45 mg/dL (ref 0.44–1.00)
GFR calc Af Amer: >60 mL/min (ref >60)
Glucose, Bld: 142 mg/dL — ABNORMAL HIGH (ref 70–99)
POTASSIUM: 4.8 mmol/L (ref 3.5–5.1)
Sodium: 131 mmol/L — ABNORMAL LOW (ref 135–145)
Total Bilirubin: 1.6 mg/dL — ABNORMAL HIGH (ref 0.3–1.2)
Total Protein: 6.4 g/dL — ABNORMAL LOW (ref 6.5–8.1)

## 2018-09-14 LAB — URINALYSIS, ROUTINE W REFLEX MICROSCOPIC
Bilirubin Urine: NEGATIVE
Glucose, UA: NEGATIVE mg/dL
Hgb urine dipstick: NEGATIVE
Ketones, ur: 5 mg/dL — AB
Leukocytes, UA: NEGATIVE
Nitrite: NEGATIVE
Protein, ur: NEGATIVE mg/dL
SPECIFIC GRAVITY, URINE: 1.018 (ref 1.005–1.030)
pH: 7 (ref 5.0–8.0)

## 2018-09-14 MED ORDER — PROMETHAZINE HCL 12.5 MG RE SUPP: 12.5000 mg | Freq: Four times a day (QID) | RECTAL | 0 refills | Status: DC | PRN

## 2018-09-14 MED ORDER — SODIUM CHLORIDE 0.9 % IV BOLUS
1000.0000 mL | Freq: Once | INTRAVENOUS | Status: AC
  Administered 2018-09-14: 1000 mL via INTRAVENOUS

## 2018-09-14 MED ORDER — PROMETHAZINE HCL 12.5 MG PO TABS: 12.5000 mg | ORAL_TABLET | Freq: Four times a day (QID) | ORAL | 0 refills | Status: DC | PRN

## 2018-09-14 MED ORDER — ONDANSETRON HCL 4 MG/2ML IJ SOLN
4.0000 mg | Freq: Once | INTRAMUSCULAR | Status: AC
  Administered 2018-09-14: 4 mg via INTRAVENOUS
  Filled 2018-09-14: qty 2

## 2018-09-14 NOTE — ED Triage Notes (Signed)
Pt states that she has been having Nausea and vomiting for the past couple of days. She has not voided only once in the past 24 hours

## 2018-09-14 NOTE — ED Provider Notes (Signed)
[facility] Provider Note   CSN: [id number] Arrival date & time: 09/14/18  1019     History   Chief Complaint Chief Complaint  Patient presents with  . Urinary Retention  . Emesis    HPI Theresa Vincent is a 67 y.o. female.  HPI  67 year old female, unfortunately she was recently diagnosed with primary bronchogenic carcinoma, seems to be poorly differentiated, has had extensive brain metastases and has currently undergone 10 rounds of radiation followed by her first round of chemotherapy approximately 9 days ago.  She presents today with a complaint of approximately 4 days of nausea and vomiting.  This has been persistent, nothing seems to make it better or worse, it is not associated with fevers coughing shortness of breath or swelling.  She did have her Port-A-Cath placed in the chest approximately 4 or 5 days ago.  She has been taking Zofran at home without relief of her nausea and continues to vomit, she has not made much in the way of urine because of dehydration.  The symptoms are gradually worsening.  She is accompanied by her daughter who adds that she has been unable to take much fluid because of persistent vomiting.  She has no back pain, no fevers, no diarrhea, no cough or SOB and no rashes.  Past Medical History:  Diagnosis Date  . Arthritis    in back   . Cancer ([place])   . Diverticula of colon   . Diverticulosis   . Hemorrhoid   . Thyroid disease    was hyperthyroid had frozen and now on meds    Patient Active Problem List   Diagnosis Date Noted  . Brain metastases ([place]) 08/01/2018  . Non-small cell lung cancer, right ([place]) 07/16/2018  . Screening for colorectal cancer 04/23/2018  . Goals of care, counseling/discussion 04/23/2018  . LLQ pain 04/08/2018  . Vaginal atrophy 04/08/2018  . BV (bacterial vaginosis) 04/08/2018  . Vaginal odor 04/08/2018  . Vaginal discharge 04/08/2018  . Hypothyroidism following radioiodine therapy 01/13/2016    . Prediabetes 01/13/2016  . Fracture of fourth toe, right, closed 06/08/2014  . Wrist fracture 05/02/2011  . FRACTURE, RADIUS, DISTAL 03/06/2011  . BLOOD IN STOOL 07/13/2009  . ARTHRITIS, BACK 07/13/2009    Past Surgical History:  Procedure Laterality Date  . Broken toe screw in toe    . EYE SURGERY  03/20/16  . IR IMAGING GUIDED PORT INSERTION  09/11/2018  . pt had her bladder stretched in the past     Dr. Michela Pitcher   . radiation thyroid    . WRIST SURGERY Right      OB History    Gravida  1   Para  1   Term  1   Preterm      AB      Living  1     SAB      TAB      Ectopic      Multiple      Live Births  1            Home Medications    Prior to Admission medications   Medication Sig Start Date End Date Taking? Authorizing Provider  Multiple Vitamin (MULTIVITAMIN WITH MINERALS) TABS tablet Take 1 tablet by mouth daily.   Yes [provider]  acetaminophen (TYLENOL) 500 MG tablet Take 500 mg by mouth daily as needed for moderate pain or headache.    [provider]  dexamethasone (DECADRON) 4  MG tablet Take 1 tab two times a day the day before Alimta chemo. Take 2 tabs the day after chemo, then take 2 tabs two times a day for 2 days. 09/04/18   Higgs, Mathis Dad, MD  folic acid (FOLVITE) 1 MG tablet Take 1 mg by mouth daily.    [provider]  HYDROcodone-Acetaminophen (VICODIN) 5-300 MG TABS Take 1 tablet by mouth every 4 (four) hours as needed. 09/04/18   Higgs, Mathis Dad, MD  ibuprofen (ADVIL,MOTRIN) 200 MG tablet Take 400 mg by mouth every 6 (six) hours as needed (pain / headache).    [provider]  levothyroxine (SYNTHROID, LEVOTHROID) 25 MCG tablet TAKE 1 TABLET (25 MCG TOTAL) BY MOUTH DAILY BEFORE BREAKFAST. 09/11/18   Cassandria Anger, MD  lidocaine-prilocaine (EMLA) cream Apply to affected area once Patient taking differently: Apply 1 application topically daily as needed (local anesthesia). Apply to affected area once  09/04/18   Higgs, Mathis Dad, MD  Misc. Devices MISC Please supply one wheelchair for patient. 09/04/18   Higgs, Mathis Dad, MD  ondansetron (ZOFRAN) 8 MG tablet Take 1 tablet (8 mg total) by mouth 2 (two) times daily as needed (Nausea or vomiting). 09/04/18   Higgs, Mathis Dad, MD  PEMEtrexed Disodium (ALIMTA IV) Inject into the vein every 21 ( twenty-one) days.    [provider]  prochlorperazine (COMPAZINE) 10 MG tablet Take 1 tablet (10 mg total) by mouth every 6 (six) hours as needed (Nausea or vomiting). 09/04/18   Higgs, Mathis Dad, MD  promethazine (PHENERGAN) 12.5 MG suppository Place 1 suppository (12.5 mg total) rectally every 6 (six) hours as needed for nausea or vomiting. 09/14/18   Noemi Chapel, MD  promethazine (PHENERGAN) 12.5 MG tablet Take 1 tablet (12.5 mg total) by mouth every 6 (six) hours as needed for nausea or vomiting. 09/14/18   Noemi Chapel, MD  pseudoephedrine-acetaminophen (TYLENOL SINUS) 30-500 MG TABS tablet Take 1 tablet by mouth daily as needed (allergies).    [provider]    Family History Family History  Problem Relation Age of Onset  . Diabetes Mother   . Heart disease Father   . Diabetes Father   . Diabetes Sister   . Diabetes Sister   . Cancer Sister        breast    Social History Social History   Tobacco Use  . Smoking status: Former Smoker    Packs/day: 0.00    Years: 45.00    Pack years: 0.00  . Smokeless tobacco: Never Used  Substance Use Topics  . Alcohol use: Yes    Comment: rarely   . Drug use: No     Allergies   Avelox [moxifloxacin hcl in nacl]   Review of Systems Review of Systems  All other systems reviewed and are negative.    Physical Exam Updated Vital Signs BP 129/71   Pulse 93   Temp 98.7 F (37.1 C) (Oral)   Resp 16   SpO2 93%   Physical Exam  Constitutional: She appears well-developed and well-nourished. No distress.  HENT:  Head: Normocephalic and atraumatic.  Mouth/Throat: No oropharyngeal exudate.    Dry mucous membranes  Eyes: Pupils are equal, round, and reactive to light. Conjunctivae and EOM are normal. Right eye exhibits no discharge. Left eye exhibits no discharge. No scleral icterus.  Neck: Normal range of motion. Neck supple. No JVD present. No thyromegaly present.  Cardiovascular: Normal rate, regular rhythm, normal heart sounds and intact distal pulses. Exam reveals no gallop and no  friction rub.  No murmur heard. Pulmonary/Chest: Effort normal and breath sounds normal. No respiratory distress. She has no wheezes. She has no rales.  Port-A-Cath palpated in the right chest, no surrounding redness  Abdominal: Soft. Bowel sounds are normal. She exhibits no distension and no mass. There is no tenderness.  Soft nontender abdomen  Musculoskeletal: Normal range of motion. She exhibits no edema or tenderness.  Lymphadenopathy:    She has no cervical adenopathy.  Neurological: She is alert. Coordination normal.  In the supine position the patient is able to lift all 4 extremities, normal grips, normal speech, normal memory, no facial droop  Skin: Skin is warm and dry. No rash noted. No erythema.  Psychiatric: She has a normal mood and affect. Her behavior is normal.  Nursing note and vitals reviewed.    ED Treatments / Results  Labs (all labs ordered are listed, but only abnormal results are displayed) Labs Reviewed  CBC WITH DIFFERENTIAL/PLATELET - Abnormal; Notable for the following components:      Result Value   WBC 2.9 (*)    HCT 35.6 (*)    All other components within normal limits  COMPREHENSIVE METABOLIC PANEL - Abnormal; Notable for the following components:   Sodium 131 (*)    Chloride 95 (*)    Glucose, Bld 142 (*)    Calcium 8.4 (*)    Total Protein 6.4 (*)    Albumin 3.0 (*)    Total Bilirubin 1.6 (*)    All other components within normal limits  URINALYSIS, ROUTINE W REFLEX MICROSCOPIC - Abnormal; Notable for the following components:   APPearance HAZY (*)     Ketones, ur 5 (*)    All other components within normal limits  URINE CULTURE    EKG None  Radiology Dg Chest Port 1 View  Result Date: 09/14/2018 CLINICAL DATA:  Nausea, vomiting weakness right non-small-cell lung carcinoma EXAM: PORTABLE CHEST 1 VIEW COMPARISON:  07/04/2018 FINDINGS: New right IJ port catheter tip SVC RA junction. Known medial right upper lobe lung mass by PET-CT is superimposed over the right hilum and not clearly defined by plain radiography. Stable heart size and vascularity. Mild diffuse interstitial prominence, nonspecific. No large effusion or pneumothorax. Trachea is midline. No acute osseous finding. IMPRESSION: Stable chest exam without superimposed acute process. Electronically Signed   By: Jerilynn Mages.  Shick M.D.   On: 09/14/2018 11:20    Procedures Procedures (including critical care time)  Medications Ordered in ED Medications  sodium chloride 0.9 % bolus 1,000 mL (0 mLs Intravenous Stopped 09/14/18 1210)  ondansetron (ZOFRAN) injection 4 mg (4 mg Intravenous Given 09/14/18 1108)     Initial Impression / Assessment and Plan / ED Course  I have reviewed the triage vital signs and the nursing notes.  Pertinent labs & imaging results that were available during my care of the patient were reviewed by me and considered in my medical decision making (see chart for details).  Clinical Course as of Sep 14 1241  Sat Sep 14, 2018  1153 Chest x-ray reveals no signs of infiltrate or pneumothorax.  No acute process is seen.  Urinalysis has been collected, lab work collected, IV fluids infusing at this time   [BM]  1223 Labs show a very slight hyponatremia, preserved renal function, preserved liver function, very slight leukopenia with a white blood cell count of 2900 and no anemia.  Additionally the urinalysis shows only slight ketones, no signs of infection, blood pressure is remained stable, heart  rate is improved to 93, IV fluids given as well as intravenous Zofran and  the patient feels better.  She is undergoing a p.o. trial at this time.   [BM]  1239 The pt has tolerated crackers and juice well.  She is stable for d/c, Rx for promethazine given   [BM]    Clinical Course User Index [BM] Noemi Chapel, MD   The patient does not appear in distress but she does have dehydration from a persistent nausea and vomiting which is likely related to the chemotherapy.  We will search for alternative sources of feeling sick such as urinary infection or pneumonia especially status post placement of her Port-A-Cath however at this time she appears to be in no distress, she will be given IV fluids and antiemetics, the patient is agreeable to the plan.  Final Clinical Impressions(s) / ED Diagnoses   Final diagnoses:  Nausea and vomiting, intractability of vomiting not specified, unspecified vomiting type  Dehydration  Hyponatremia    ED Discharge Orders         Ordered    promethazine (PHENERGAN) 12.5 MG tablet  Every 6 hours PRN     09/14/18 1240    promethazine (PHENERGAN) 12.5 MG suppository  Every 6 hours PRN     09/14/18 1240           Noemi Chapel, MD 09/14/18 1242

## 2018-09-14 NOTE — Discharge Instructions (Addendum)
Please continue to use Zofran as needed for nausea, 1 tablet every 6 hours however if you are not improving with this medication you should try promethazine instead.  Additionally, your salt level was slightly low today - this should be rechecked in the next 2 weeks.  Do not take promethazine and Compazine on the same day  Promethazine can be used every 6 hours either by mouth or as a suppository as needed for nausea or vomiting.  It will make you sleepy  See your doctor for ongoing mild symptoms, but return to the ER for severe or worsening symptoms.

## 2018-09-14 NOTE — ED Notes (Signed)
Pt given po fluids. 

## 2018-09-15 ENCOUNTER — Encounter (HOSPITAL_COMMUNITY): Payer: Self-pay | Admitting: Emergency Medicine

## 2018-09-15 ENCOUNTER — Other Ambulatory Visit: Payer: Self-pay

## 2018-09-15 ENCOUNTER — Inpatient Hospital Stay (HOSPITAL_COMMUNITY): Payer: Medicare Other

## 2018-09-15 ENCOUNTER — Emergency Department (HOSPITAL_COMMUNITY): Payer: Medicare Other

## 2018-09-15 ENCOUNTER — Inpatient Hospital Stay (HOSPITAL_COMMUNITY)
Admission: EM | Admit: 2018-09-15 | Discharge: 2018-09-23 | DRG: 871 | Disposition: A | Discharge status: Skilled Nursing Facility | Payer: Medicare Other | Attending: Internal Medicine | Admitting: Internal Medicine

## 2018-09-15 DIAGNOSIS — Z809 Family history of malignant neoplasm, unspecified: Secondary | ICD-10-CM

## 2018-09-15 DIAGNOSIS — D709 Neutropenia, unspecified: Secondary | ICD-10-CM | POA: Diagnosis present

## 2018-09-15 DIAGNOSIS — J189 Pneumonia, unspecified organism: Secondary | ICD-10-CM

## 2018-09-15 DIAGNOSIS — C3491 Malignant neoplasm of unspecified part of right bronchus or lung: Secondary | ICD-10-CM | POA: Diagnosis present

## 2018-09-15 DIAGNOSIS — Z87891 Personal history of nicotine dependence: Secondary | ICD-10-CM | POA: Diagnosis not present

## 2018-09-15 DIAGNOSIS — R112 Nausea with vomiting, unspecified: Secondary | ICD-10-CM

## 2018-09-15 DIAGNOSIS — J9620 Acute and chronic respiratory failure, unspecified whether with hypoxia or hypercapnia: Secondary | ICD-10-CM | POA: Diagnosis not present

## 2018-09-15 DIAGNOSIS — C7931 Secondary malignant neoplasm of brain: Secondary | ICD-10-CM | POA: Diagnosis present

## 2018-09-15 DIAGNOSIS — E860 Dehydration: Secondary | ICD-10-CM | POA: Diagnosis present

## 2018-09-15 DIAGNOSIS — A419 Sepsis, unspecified organism: Principal | ICD-10-CM | POA: Diagnosis present

## 2018-09-15 DIAGNOSIS — L270 Generalized skin eruption due to drugs and medicaments taken internally: Secondary | ICD-10-CM | POA: Diagnosis not present

## 2018-09-15 DIAGNOSIS — T361X5A Adverse effect of cephalosporins and other beta-lactam antibiotics, initial encounter: Secondary | ICD-10-CM | POA: Diagnosis not present

## 2018-09-15 DIAGNOSIS — Z8249 Family history of ischemic heart disease and other diseases of the circulatory system: Secondary | ICD-10-CM | POA: Diagnosis not present

## 2018-09-15 DIAGNOSIS — J440 Chronic obstructive pulmonary disease with acute lower respiratory infection: Secondary | ICD-10-CM | POA: Diagnosis present

## 2018-09-15 DIAGNOSIS — J181 Lobar pneumonia, unspecified organism: Secondary | ICD-10-CM | POA: Diagnosis not present

## 2018-09-15 DIAGNOSIS — Z5111 Encounter for antineoplastic chemotherapy: Secondary | ICD-10-CM | POA: Diagnosis not present

## 2018-09-15 DIAGNOSIS — R0602 Shortness of breath: Secondary | ICD-10-CM

## 2018-09-15 DIAGNOSIS — R262 Difficulty in walking, not elsewhere classified: Secondary | ICD-10-CM | POA: Diagnosis not present

## 2018-09-15 DIAGNOSIS — R0689 Other abnormalities of breathing: Secondary | ICD-10-CM | POA: Diagnosis not present

## 2018-09-15 DIAGNOSIS — J9621 Acute and chronic respiratory failure with hypoxia: Secondary | ICD-10-CM | POA: Diagnosis not present

## 2018-09-15 DIAGNOSIS — D649 Anemia, unspecified: Secondary | ICD-10-CM | POA: Diagnosis present

## 2018-09-15 DIAGNOSIS — Z9221 Personal history of antineoplastic chemotherapy: Secondary | ICD-10-CM | POA: Diagnosis not present

## 2018-09-15 DIAGNOSIS — E890 Postprocedural hypothyroidism: Secondary | ICD-10-CM | POA: Diagnosis present

## 2018-09-15 DIAGNOSIS — I959 Hypotension, unspecified: Secondary | ICD-10-CM | POA: Diagnosis not present

## 2018-09-15 DIAGNOSIS — Z79890 Hormone replacement therapy (postmenopausal): Secondary | ICD-10-CM | POA: Diagnosis not present

## 2018-09-15 DIAGNOSIS — E871 Hypo-osmolality and hyponatremia: Secondary | ICD-10-CM | POA: Diagnosis present

## 2018-09-15 DIAGNOSIS — J449 Chronic obstructive pulmonary disease, unspecified: Secondary | ICD-10-CM | POA: Diagnosis present

## 2018-09-15 DIAGNOSIS — Z7401 Bed confinement status: Secondary | ICD-10-CM | POA: Diagnosis not present

## 2018-09-15 DIAGNOSIS — E039 Hypothyroidism, unspecified: Secondary | ICD-10-CM | POA: Diagnosis not present

## 2018-09-15 DIAGNOSIS — Z85118 Personal history of other malignant neoplasm of bronchus and lung: Secondary | ICD-10-CM | POA: Diagnosis not present

## 2018-09-15 DIAGNOSIS — G936 Cerebral edema: Secondary | ICD-10-CM | POA: Diagnosis present

## 2018-09-15 DIAGNOSIS — Z66 Do not resuscitate: Secondary | ICD-10-CM | POA: Diagnosis present

## 2018-09-15 DIAGNOSIS — R531 Weakness: Secondary | ICD-10-CM | POA: Diagnosis not present

## 2018-09-15 DIAGNOSIS — R0902 Hypoxemia: Secondary | ICD-10-CM | POA: Diagnosis not present

## 2018-09-15 DIAGNOSIS — M6281 Muscle weakness (generalized): Secondary | ICD-10-CM | POA: Diagnosis not present

## 2018-09-15 DIAGNOSIS — E876 Hypokalemia: Secondary | ICD-10-CM | POA: Diagnosis present

## 2018-09-15 DIAGNOSIS — J441 Chronic obstructive pulmonary disease with (acute) exacerbation: Secondary | ICD-10-CM | POA: Diagnosis present

## 2018-09-15 DIAGNOSIS — R918 Other nonspecific abnormal finding of lung field: Secondary | ICD-10-CM | POA: Diagnosis not present

## 2018-09-15 DIAGNOSIS — Z923 Personal history of irradiation: Secondary | ICD-10-CM | POA: Diagnosis not present

## 2018-09-15 DIAGNOSIS — Y95 Nosocomial condition: Secondary | ICD-10-CM | POA: Diagnosis present

## 2018-09-15 DIAGNOSIS — R000 Tachycardia, unspecified: Secondary | ICD-10-CM | POA: Diagnosis not present

## 2018-09-15 DIAGNOSIS — R41841 Cognitive communication deficit: Secondary | ICD-10-CM | POA: Diagnosis not present

## 2018-09-15 LAB — CBC WITH DIFFERENTIAL/PLATELET
Basophils Absolute: 0 10*3/uL (ref 0.0–0.1)
Basophils Relative: 0 %
EOS PCT: 1 %
Eosinophils Absolute: 0 10*3/uL (ref 0.0–0.7)
HEMATOCRIT: 32 % — AB (ref 36.0–46.0)
Hemoglobin: 10.8 g/dL — ABNORMAL LOW (ref 12.0–15.0)
LYMPHS ABS: 0.5 10*3/uL — AB (ref 0.7–4.0)
LYMPHS PCT: 9 %
MCH: 29.3 pg (ref 26.0–34.0)
MCHC: 33.8 g/dL (ref 30.0–36.0)
MCV: 86.7 fL (ref 78.0–100.0)
MONO ABS: 0.5 10*3/uL (ref 0.1–1.0)
Monocytes Relative: 9 %
NEUTROS ABS: 4.5 10*3/uL (ref 1.7–7.7)
Neutrophils Relative %: 81 %
Platelets: 230 10*3/uL (ref 150–400)
RBC: 3.69 MIL/uL — ABNORMAL LOW (ref 3.87–5.11)
RDW: 14.9 % (ref 11.5–15.5)
WBC: 5.5 10*3/uL (ref 4.0–10.5)

## 2018-09-15 LAB — COMPREHENSIVE METABOLIC PANEL
ALBUMIN: 2.6 g/dL — AB (ref 3.5–5.0)
ALK PHOS: 75 U/L (ref 38–126)
ALT: 18 U/L (ref 0–44)
ANION GAP: 10 (ref 5–15)
AST: 24 U/L (ref 15–41)
BILIRUBIN TOTAL: 0.7 mg/dL (ref 0.3–1.2)
BUN: 7 mg/dL — AB (ref 8–23)
CO2: 25 mmol/L (ref 22–32)
Calcium: 8.1 mg/dL — ABNORMAL LOW (ref 8.9–10.3)
Chloride: 97 mmol/L — ABNORMAL LOW (ref 98–111)
Creatinine, Ser: 0.44 mg/dL (ref 0.44–1.00)
GFR calc non Af Amer: >60 mL/min (ref >60)
GLUCOSE: 146 mg/dL — AB (ref 70–99)
Potassium: 3.5 mmol/L (ref 3.5–5.1)
SODIUM: 132 mmol/L — AB (ref 135–145)
Total Protein: 6 g/dL — ABNORMAL LOW (ref 6.5–8.1)

## 2018-09-15 LAB — TROPONIN I: Troponin I: <0.03 ng/mL (ref <0.03)

## 2018-09-15 LAB — I-STAT CG4 LACTIC ACID, ED: Lactic Acid, Venous: 1.17 mmol/L (ref 0.5–1.9)

## 2018-09-15 LAB — URINALYSIS, ROUTINE W REFLEX MICROSCOPIC
BILIRUBIN URINE: NEGATIVE
GLUCOSE, UA: NEGATIVE mg/dL
Hgb urine dipstick: NEGATIVE
KETONES UR: NEGATIVE mg/dL
Leukocytes, UA: NEGATIVE
Nitrite: NEGATIVE
PH: 7 (ref 5.0–8.0)
PROTEIN: NEGATIVE mg/dL
Specific Gravity, Urine: 1.005 (ref 1.005–1.030)

## 2018-09-15 LAB — URINE CULTURE: Culture: NO GROWTH

## 2018-09-15 LAB — LIPASE, BLOOD: Lipase: 18 U/L (ref 11–51)

## 2018-09-15 MED ORDER — SODIUM CHLORIDE 0.9 % IV SOLN
1000.0000 mL | INTRAVENOUS | Status: DC
  Administered 2018-09-15 – 2018-09-18 (×7): 1000 mL via INTRAVENOUS

## 2018-09-15 MED ORDER — VANCOMYCIN HCL 10 G IV SOLR
1500.0000 mg | Freq: Once | INTRAVENOUS | Status: AC
  Administered 2018-09-15: 1500 mg via INTRAVENOUS
  Filled 2018-09-15 (×2): qty 1500

## 2018-09-15 MED ORDER — FOLIC ACID 1 MG PO TABS
1.0000 mg | ORAL_TABLET | Freq: Every day | ORAL | Status: DC
  Administered 2018-09-16 – 2018-09-23 (×8): 1 mg via ORAL
  Filled 2018-09-15 (×8): qty 1

## 2018-09-15 MED ORDER — ONDANSETRON HCL 4 MG/2ML IJ SOLN: 4.0000 mg | INTRAMUSCULAR | Status: DC | PRN

## 2018-09-15 MED ORDER — VANCOMYCIN HCL IN DEXTROSE 1-5 GM/200ML-% IV SOLN: 1000.0000 mg | Freq: Once | INTRAVENOUS | Status: DC

## 2018-09-15 MED ORDER — VANCOMYCIN HCL IN DEXTROSE 1-5 GM/200ML-% IV SOLN: 1000.0000 mg | INTRAVENOUS | Status: DC

## 2018-09-15 MED ORDER — ENOXAPARIN SODIUM 40 MG/0.4ML ~~LOC~~ SOLN
40.0000 mg | SUBCUTANEOUS | Status: DC
  Administered 2018-09-16 – 2018-09-23 (×8): 40 mg via SUBCUTANEOUS
  Filled 2018-09-15 (×8): qty 0.4

## 2018-09-15 MED ORDER — LEVOTHYROXINE SODIUM 25 MCG PO TABS
25.0000 ug | ORAL_TABLET | Freq: Every day | ORAL | Status: DC
  Administered 2018-09-16 – 2018-09-23 (×8): 25 ug via ORAL
  Filled 2018-09-15 (×8): qty 1

## 2018-09-15 MED ORDER — METRONIDAZOLE IN NACL 5-0.79 MG/ML-% IV SOLN
500.0000 mg | Freq: Three times a day (TID) | INTRAVENOUS | Status: DC
  Administered 2018-09-15: 500 mg via INTRAVENOUS
  Filled 2018-09-15: qty 100

## 2018-09-15 MED ORDER — SODIUM CHLORIDE 0.9 % IV BOLUS
1000.0000 mL | Freq: Once | INTRAVENOUS | Status: AC
  Administered 2018-09-15: 1000 mL via INTRAVENOUS

## 2018-09-15 MED ORDER — LINEZOLID 600 MG/300ML IV SOLN
600.0000 mg | Freq: Once | INTRAVENOUS | Status: AC
  Administered 2018-09-15: 600 mg via INTRAVENOUS
  Filled 2018-09-15 (×2): qty 300

## 2018-09-15 MED ORDER — SODIUM CHLORIDE 0.9 % IV SOLN
2.0000 g | Freq: Two times a day (BID) | INTRAVENOUS | Status: DC
  Administered 2018-09-16 – 2018-09-23 (×15): 2 g via INTRAVENOUS
  Filled 2018-09-15 (×17): qty 2

## 2018-09-15 MED ORDER — SODIUM CHLORIDE 0.9 % IV SOLN
1.0000 g | Freq: Once | INTRAVENOUS | Status: AC
  Administered 2018-09-16: 1 g via INTRAVENOUS
  Filled 2018-09-15: qty 1

## 2018-09-15 MED ORDER — ONDANSETRON HCL 4 MG/2ML IJ SOLN
4.0000 mg | Freq: Four times a day (QID) | INTRAMUSCULAR | Status: DC | PRN
  Administered 2018-09-17 – 2018-09-18 (×2): 4 mg via INTRAVENOUS
  Filled 2018-09-15 (×2): qty 2

## 2018-09-15 MED ORDER — ACETAMINOPHEN 325 MG PO TABS
650.0000 mg | ORAL_TABLET | Freq: Once | ORAL | Status: AC
  Administered 2018-09-15: 650 mg via ORAL
  Filled 2018-09-15: qty 2

## 2018-09-15 MED ORDER — SODIUM CHLORIDE 0.9 % IV SOLN
2.0000 g | Freq: Once | INTRAVENOUS | Status: AC
  Administered 2018-09-15: 2 g via INTRAVENOUS
  Filled 2018-09-15: qty 2

## 2018-09-15 MED ORDER — ONDANSETRON HCL 4 MG PO TABS: 4.0000 mg | ORAL_TABLET | Freq: Four times a day (QID) | ORAL | Status: DC | PRN

## 2018-09-15 MED ORDER — SODIUM CHLORIDE 0.9 % IV SOLN
INTRAVENOUS | Status: DC | PRN
  Administered 2018-09-15 – 2018-09-19 (×4): 1000 mL via INTRAVENOUS

## 2018-09-15 NOTE — ED Provider Notes (Signed)
[facility] Provider Note   CSN: [id number] Arrival date & time: 09/15/18  1827     History   Chief Complaint Chief Complaint  Patient presents with  . Emesis    HPI Theresa Vincent is a 67 y.o. female.   Emesis      Pt was seen at [facility]. Per pt, c/o gradual onset and persistence of multiple intermittent episodes of N/V that began 3 to 4 days ago. Has been associated with decreased urination and generalized weakness. Pt states her "daughter keeps telling me I feel like I have a fever." Pt endorses hx lung CA with LD chemo 09/05/2018. Pt was evaluated in the ED yesterday for her symptoms, rx zofran and phenergan. Pt states she took zofran this morning without improvement. Denies diarrhea, no abd pain, no CP/SOB, no back pain, no fevers, no black or blood in stools or emesis.    Past Medical History:  Diagnosis Date  . Arthritis    in back   . Cancer ([place])   . Diverticula of colon   . Diverticulosis   . Hemorrhoid   . Thyroid disease    was hyperthyroid had frozen and now on meds    Patient Active Problem List   Diagnosis Date Noted  . Brain metastases ([place]) 08/01/2018  . Non-small cell lung cancer, right ([place]) 07/16/2018  . Screening for colorectal cancer 04/23/2018  . Goals of care, counseling/discussion 04/23/2018  . LLQ pain 04/08/2018  . Vaginal atrophy 04/08/2018  . BV (bacterial vaginosis) 04/08/2018  . Vaginal odor 04/08/2018  . Vaginal discharge 04/08/2018  . Hypothyroidism following radioiodine therapy 01/13/2016  . Prediabetes 01/13/2016  . Fracture of fourth toe, right, closed 06/08/2014  . Wrist fracture 05/02/2011  . FRACTURE, RADIUS, DISTAL 03/06/2011  . BLOOD IN STOOL 07/13/2009  . ARTHRITIS, BACK 07/13/2009    Past Surgical History:  Procedure Laterality Date  . Broken toe screw in toe    . EYE SURGERY  03/20/16  . IR IMAGING GUIDED PORT INSERTION  09/11/2018  . pt had her bladder stretched in the past     Dr. Michela Pitcher   .  radiation thyroid    . WRIST SURGERY Right      OB History    Gravida  1   Para  1   Term  1   Preterm      AB      Living  1     SAB      TAB      Ectopic      Multiple      Live Births  1            Home Medications    Prior to Admission medications   Medication Sig Start Date End Date Taking? Authorizing Provider  acetaminophen (TYLENOL) 500 MG tablet Take 500 mg by mouth daily as needed for moderate pain or headache.    [provider]  dexamethasone (DECADRON) 4 MG tablet Take 1 tab two times a day the day before [place] chemo. Take 2 tabs the day after chemo, then take 2 tabs two times a day for 2 days. 09/04/18   Higgs, Mathis Dad, MD  folic acid (FOLVITE) 1 MG tablet Take 1 mg by mouth daily.    [provider]  HYDROcodone-Acetaminophen (VICODIN) 5-300 MG TABS Take 1 tablet by mouth every 4 (four) hours as needed. 09/04/18   Higgs, Mathis Dad, MD  ibuprofen (ADVIL,MOTRIN) 200 MG tablet Take 400 mg  by mouth every 6 (six) hours as needed (pain / headache).    [provider]  levothyroxine (SYNTHROID, LEVOTHROID) 25 MCG tablet TAKE 1 TABLET (25 MCG TOTAL) BY MOUTH DAILY BEFORE BREAKFAST. 09/11/18   Cassandria Anger, MD  lidocaine-prilocaine (EMLA) cream Apply to affected area once Patient taking differently: Apply 1 application topically daily as needed (local anesthesia). Apply to affected area once 09/04/18   Higgs, Mathis Dad, MD  Misc. Devices MISC Please supply one wheelchair for patient. 09/04/18   Higgs, Mathis Dad, MD  Multiple Vitamin (MULTIVITAMIN WITH MINERALS) TABS tablet Take 1 tablet by mouth daily.    [provider]  ondansetron (ZOFRAN) 8 MG tablet Take 1 tablet (8 mg total) by mouth 2 (two) times daily as needed (Nausea or vomiting). 09/04/18   Higgs, Mathis Dad, MD  PEMEtrexed Disodium (ALIMTA IV) Inject into the vein every 21 ( twenty-one) days.    [provider]  prochlorperazine (COMPAZINE) 10 MG tablet Take 1 tablet  (10 mg total) by mouth every 6 (six) hours as needed (Nausea or vomiting). 09/04/18   Higgs, Mathis Dad, MD  promethazine (PHENERGAN) 12.5 MG suppository Place 1 suppository (12.5 mg total) rectally every 6 (six) hours as needed for nausea or vomiting. 09/14/18   Noemi Chapel, MD  promethazine (PHENERGAN) 12.5 MG tablet Take 1 tablet (12.5 mg total) by mouth every 6 (six) hours as needed for nausea or vomiting. 09/14/18   Noemi Chapel, MD  pseudoephedrine-acetaminophen (TYLENOL SINUS) 30-500 MG TABS tablet Take 1 tablet by mouth daily as needed (allergies).    [provider]    Family History Family History  Problem Relation Age of Onset  . Diabetes Mother   . Heart disease Father   . Diabetes Father   . Diabetes Sister   . Diabetes Sister   . Cancer Sister        breast    Social History Social History   Tobacco Use  . Smoking status: Former Smoker    Packs/day: 0.00    Years: 45.00    Pack years: 0.00  . Smokeless tobacco: Never Used  Substance Use Topics  . Alcohol use: Yes    Comment: rarely   . Drug use: No     Allergies   Avelox [moxifloxacin hcl in nacl]   Review of Systems Review of Systems  Gastrointestinal: Positive for vomiting.  ROS: Statement: All systems negative except as marked or noted in the HPI; Constitutional: +fever and chills, generalized weakness. ; ; Eyes: Negative for eye pain, redness and discharge. ; ; ENMT: Negative for ear pain, hoarseness, nasal congestion, sinus pressure and sore throat. ; ; Cardiovascular: Negative for chest pain, palpitations, diaphoresis, dyspnea and peripheral edema. ; ; Respiratory: Negative for cough, wheezing and stridor. ; ; Gastrointestinal: +N/V. Negative for diarrhea, abdominal pain, blood in stool, hematemesis, jaundice and rectal bleeding. . ; ; Genitourinary: +decreased urination. Negative for dysuria, flank pain and hematuria. ; ; Musculoskeletal: Negative for back pain and neck pain. Negative for swelling and  trauma.; ; Skin: Negative for pruritus, rash, abrasions, blisters, bruising and skin lesion.; ; Neuro: Negative for headache, lightheadedness and neck stiffness. Negative for altered level of consciousness, altered mental status, extremity weakness, paresthesias, involuntary movement, seizure and syncope.        Physical Exam Updated Vital Signs BP 119/74 (BP Location: Right Arm)   Pulse (!) 106   Temp (!) 102.7 F (39.3 C) (Rectal)   Resp 20   Ht 5\' 2"  (1.575 m)  Wt 65 kg   SpO2 93%   BMI 26.21 kg/m    Patient Vitals for the past 24 hrs:  BP Temp Temp src Pulse Resp SpO2 Height Weight  09/15/18 1856 - (!) 102.7 F (39.3 C) Rectal - - - - -  09/15/18 1849 - 100.1 F (37.8 C) Oral - - - - -  09/15/18 1830 - - - - - - 5\' 2"  (1.575 m) 65 kg  09/15/18 1829 119/74 98.7 F (37.1 C) Oral (!) 106 20 93 % - -     Physical Exam 1900: Physical examination:  Nursing notes reviewed; Vital signs and O2 SAT reviewed;  Constitutional: Well developed, Well nourished, In no acute distress; Head:  Normocephalic, atraumatic; Eyes: EOMI, PERRL, No scleral icterus; ENMT: Mouth and pharynx normal, Mucous membranes dry; Neck: Supple, Full range of motion. No meningeal signs.; Cardiovascular: Tachycardic rate and rhythm, No gallop; Respiratory: Breath sounds clear & equal bilaterally, No wheezes.  Speaking full sentences with ease, Normal respiratory effort/excursion; Chest: Nontender, Movement normal; Abdomen: Soft, Nontender, Nondistended, Normal bowel sounds; Genitourinary: No CVA tenderness; Extremities: Peripheral pulses normal, No tenderness, No edema, No calf edema or asymmetry.; Neuro: AA&Ox3, Major CN grossly intact.  Speech clear. No gross focal motor or sensory deficits in extremities.; Skin: Color normal, Warm, Dry.    ED Treatments / Results  Labs (all labs ordered are listed, but only abnormal results are displayed)   EKG EKG Interpretation  Date/Time:  Sunday September 15 2018  19:33:08 EDT Ventricular Rate:  103 PR Interval:    QRS Duration: 89 QT Interval:  323 QTC Calculation: 423 R Axis:   48 Text Interpretation:  Sinus tachycardia Borderline low voltage, extremity leads Confirmed by Veryl Speak [phone]) on 09/16/2018 10:24:08 PM     Radiology   Procedures Procedures (including critical care time)  Medications Ordered in ED Medications  0.9 %  sodium chloride infusion (has no administration in time range)  ceFEPIme (MAXIPIME) 2 g in sodium chloride 0.9 % 100 mL IVPB (has no administration in time range)  metroNIDAZOLE (FLAGYL) IVPB 500 mg (has no administration in time range)  vancomycin (VANCOCIN) IVPB 1000 mg/200 mL premix (has no administration in time range)  ondansetron (ZOFRAN) injection 4 mg (has no administration in time range)  acetaminophen (TYLENOL) tablet 650 mg (has no administration in time range)     Initial Impression / Assessment and Plan / ED Course  I have reviewed the triage vital signs and the nursing notes.  Pertinent labs & imaging results that were available during my care of the patient were reviewed by me and considered in my medical decision making (see chart for details).  MDM Reviewed: previous chart, nursing note and vitals Reviewed previous: labs and ECG Interpretation: labs, ECG and x-ray Total time providing critical care: 30-74 minutes. This excludes time spent performing separately reportable procedures and services. Consults: admitting MD    CRITICAL CARE Performed by: Francine Graven Total critical care time: 35 minutes Critical care time was exclusive of separately billable procedures and treating other patients. Critical care was necessary to treat or prevent imminent or life-threatening deterioration. Critical care was time spent personally by me on the following activities: development of treatment plan with patient and/or surrogate as well as nursing, discussions with consultants, evaluation of  patient's response to treatment, examination of patient, obtaining history from patient or surrogate, ordering and performing treatments and interventions, ordering and review of laboratory studies, ordering and review of radiographic studies, pulse oximetry  and re-evaluation of patient's condition.   ED ECG REPORT   Date: 09/15/2018  Rate: 102  Rhythm: sinus tachycardia  QRS Axis: normal  Intervals: normal  ST/T Wave abnormalities: normal  Conduction Disutrbances:none  Narrative Interpretation:   Old EKG Reviewed: unchanged; compared to EKG dated 07/04/2018, rate faster. I have personally reviewed the EKG tracing and agree with the computerized printout as noted.  Results for orders placed or performed during the hospital encounter of 09/15/18  Comprehensive metabolic panel  Result Value Ref Range   Sodium 132 (L) 135 - 145 mmol/L   Potassium 3.5 3.5 - 5.1 mmol/L   Chloride 97 (L) 98 - 111 mmol/L   CO2 25 22 - 32 mmol/L   Glucose, Bld 146 (H) 70 - 99 mg/dL   BUN 7 (L) 8 - 23 mg/dL   Creatinine, Ser 0.44 0.44 - 1.00 mg/dL   Calcium 8.1 (L) 8.9 - 10.3 mg/dL   Total Protein 6.0 (L) 6.5 - 8.1 g/dL   Albumin 2.6 (L) 3.5 - 5.0 g/dL   AST 24 15 - 41 U/L   ALT 18 0 - 44 U/L   Alkaline Phosphatase 75 38 - 126 U/L   Total Bilirubin 0.7 0.3 - 1.2 mg/dL   GFR calc non Af Amer >60 >60 mL/min   GFR calc Af Amer >60 >60 mL/min   Anion gap 10 5 - 15  CBC WITH DIFFERENTIAL  Result Value Ref Range   WBC 5.5 4.0 - 10.5 K/uL   RBC 3.69 (L) 3.87 - 5.11 MIL/uL   Hemoglobin 10.8 (L) 12.0 - 15.0 g/dL   HCT 32.0 (L) 36.0 - 46.0 %   MCV 86.7 78.0 - 100.0 fL   MCH 29.3 26.0 - 34.0 pg   MCHC 33.8 30.0 - 36.0 g/dL   RDW 14.9 11.5 - 15.5 %   Platelets 230 150 - 400 K/uL   Neutrophils Relative % 81 %   Neutro Abs 4.5 1.7 - 7.7 K/uL   Lymphocytes Relative 9 %   Lymphs Abs 0.5 (L) 0.7 - 4.0 K/uL   Monocytes Relative 9 %   Monocytes Absolute 0.5 0.1 - 1.0 K/uL   Eosinophils Relative 1 %    Eosinophils Absolute 0.0 0.0 - 0.7 K/uL   Basophils Relative 0 %   Basophils Absolute 0.0 0.0 - 0.1 K/uL  Lipase, blood  Result Value Ref Range   Lipase 18 11 - 51 U/L  Troponin I  Result Value Ref Range   Troponin I <0.03 <0.03 ng/mL  I-Stat CG4 Lactic Acid, ED  (not at  [facility])  Result Value Ref Range   Lactic Acid, Venous 1.17 0.5 - 1.9 mmol/L   Dg Chest 2 View Result Date: 09/15/2018 CLINICAL DATA:  Fever, vomiting EXAM: CHEST - 2 VIEW COMPARISON:  09/14/2018 FINDINGS: Right Port-A-Cath remains in place, unchanged. Heart is normal size. Interstitial and airspace opacities in the left upper lobe concerning for pneumonia. No confluent opacity on the right. No effusions or acute bony abnormality. IMPRESSION: Interstitial and airspace opacities in the left upper lobe concerning for pneumonia. Electronically Signed   By: Rolm Baptise M.D.   On: 09/15/2018 19:33   Dg Abd 2 Views Result Date: 09/15/2018 CLINICAL DATA:  Nausea, vomiting EXAM: ABDOMEN - 2 VIEW COMPARISON:  CT 07/04/2018 FINDINGS: Nonobstructive bowel gas pattern. No free air or organomegaly. No suspicious calcification. Airspace opacities again noted in the left lung as seen on chest x-ray. IMPRESSION: No evidence of bowel obstruction or free  air. Electronically Signed   By: Rolm Baptise M.D.   On: 09/15/2018 19:34    2055:  Code Sepsis called on pt's arrival. APAP given for fever. IV abx started after [facility] obtained. BP soft; judicious IVF given. Pt remains A&O, resps easy, abd soft/NT, neuro non-focal. Dx and testing d/w pt and family.  Questions answered.  Verb understanding, agreeable to admit.  T/C returned from [facility] Dr. Darrick Meigs, case discussed, including:  HPI, pertinent PM/SHx, VS/PE, dx testing, ED course and treatment:  Agreeable to admit.       Final Clinical Impressions(s) / ED Diagnoses   Final diagnoses:  Nausea and vomiting    ED Discharge Orders    None       Francine Graven, DO 09/19/18 0100

## 2018-09-15 NOTE — ED Notes (Signed)
Pt placed on bedpan

## 2018-09-15 NOTE — H&P (Signed)
TRH H&P    Patient Demographics:    Theresa Vincent, is a 67 y.o. female  MRN: [id number]  DOB - April 01, 1951  Admit Date - 09/15/2018  Referring MD/NP/PA: Dr Thurnell Garbe  Outpatient Primary MD for the patient is Sharilyn Sites, MD  Patient coming from: Home  Chief complaint-  Vomiting   HPI:    Theresa Vincent  is a 67 y.o. female, with history of non-small cell lung cancer, poorly differentiated with extensive brain metastasis underwent 10 rounds of radiation followed by first round of chemotherapy this month.  Patient has been having dizziness with nausea and vomiting for past 1 month which has been persistent.  Nothing seems to make it better.  Today in the ED again patient came with dizziness with nausea and vomiting. In the ED she was found to be febrile with temperature 102, with tachycardia heart rate greater than 100.  Patient was started on sepsis protocol. Lab work in the ED showed WBC 5.5, lactic acid 1.17. Patient was empirically started on vancomycin and cefepime for sepsis.  Chest x-ray showed left upper lobe infiltrate, concerning for pneumonia. She denies chest pain or shortness of breath. Denies dysuria.  Denies abdominal pain. No previous history of stroke or seizures.  Patient had vasogenic cerebral edema due to brain metastases and was started on Decadron which has been taken off by oncology.    Review of systems:    In addition to the HPI above,   All other systems reviewed and are negative.   With Past History of the following :    Past Medical History:  Diagnosis Date  . Arthritis    in back   . Cancer ([place])   . Diverticula of colon   . Diverticulosis   . Hemorrhoid   . Thyroid disease    was hyperthyroid had frozen and now on meds      Past Surgical History:  Procedure Laterality Date  . Broken toe screw in toe    . EYE SURGERY  03/20/16  . IR IMAGING GUIDED PORT INSERTION   09/11/2018  . pt had her bladder stretched in the past     Dr. Michela Pitcher   . radiation thyroid    . WRIST SURGERY Right       Social History:      Social History   Tobacco Use  . Smoking status: Former Smoker    Packs/day: 0.00    Years: 45.00    Pack years: 0.00  . Smokeless tobacco: Never Used  Substance Use Topics  . Alcohol use: Yes    Comment: rarely        Family History :     Family History  Problem Relation Age of Onset  . Diabetes Mother   . Heart disease Father   . Diabetes Father   . Diabetes Sister   . Diabetes Sister   . Cancer Sister        breast      Home Medications:   Prior to Admission medications   Medication Sig Start  Date End Date Taking? Authorizing Provider  acetaminophen (TYLENOL) 500 MG tablet Take 500 mg by mouth daily as needed for moderate pain or headache.   Yes [provider]  dexamethasone (DECADRON) 4 MG tablet Take 1 tab two times a day the day before [place] chemo. Take 2 tabs the day after chemo, then take 2 tabs two times a day for 2 days. 09/04/18  Yes Higgs, Mathis Dad, MD  folic acid (FOLVITE) 1 MG tablet Take 1 mg by mouth daily.   Yes [provider]  HYDROcodone-Acetaminophen (VICODIN) 5-300 MG TABS Take 1 tablet by mouth every 4 (four) hours as needed. Patient taking differently: Take 1 tablet by mouth every 4 (four) hours as needed (pain).  09/04/18  Yes Higgs, Mathis Dad, MD  ibuprofen (ADVIL,MOTRIN) 200 MG tablet Take 400 mg by mouth every 6 (six) hours as needed (pain / headache).   Yes [provider]  levothyroxine (SYNTHROID, LEVOTHROID) 25 MCG tablet TAKE 1 TABLET (25 MCG TOTAL) BY MOUTH DAILY BEFORE BREAKFAST. 09/11/18  Yes Nida, Marella Chimes, MD  lidocaine-prilocaine (EMLA) cream Apply to affected area once Patient taking differently: Apply 1 application topically daily as needed (local anesthesia). Apply to affected area once 09/04/18  Yes Higgs, Mathis Dad, MD  Multiple Vitamin (MULTIVITAMIN WITH  MINERALS) TABS tablet Take 1 tablet by mouth daily.   Yes [provider]  ondansetron (ZOFRAN) 8 MG tablet Take 1 tablet (8 mg total) by mouth 2 (two) times daily as needed (Nausea or vomiting). 09/04/18  Yes Higgs, Mathis Dad, MD  prochlorperazine (COMPAZINE) 10 MG tablet Take 1 tablet (10 mg total) by mouth every 6 (six) hours as needed (Nausea or vomiting). 09/04/18  Yes Higgs, Mathis Dad, MD  promethazine (PHENERGAN) 12.5 MG suppository Place 1 suppository (12.5 mg total) rectally every 6 (six) hours as needed for nausea or vomiting. 09/14/18  Yes Noemi Chapel, MD  promethazine (PHENERGAN) 12.5 MG tablet Take 1 tablet (12.5 mg total) by mouth every 6 (six) hours as needed for nausea or vomiting. 09/14/18  Yes Noemi Chapel, MD  pseudoephedrine-acetaminophen (TYLENOL SINUS) 30-500 MG TABS tablet Take 1 tablet by mouth daily as needed (allergies).   Yes [provider]  Misc. Devices MISC Please supply one wheelchair for patient. 09/04/18   Higgs, Mathis Dad, MD  PEMEtrexed Disodium (ALIMTA IV) Inject into the vein every 21 ( twenty-one) days.    [provider]     Allergies:     Allergies  Allergen Reactions  . Avelox [Moxifloxacin Hcl In Nacl] Itching     Physical Exam:   Vitals  Blood pressure 108/78, pulse 98, temperature 98.9 F (37.2 C), temperature source Oral, resp. rate (!) 22, height '5\' 2"'$  (1.575 m), weight 65 kg, SpO2 97 %.  1.  General: Appears in no acute distress  2. Psychiatric:  Intact judgement and  insight, awake alert, oriented x 3.  3. Neurologic: No focal neurological deficits, all cranial nerves intact.Strength 5/5 all 4 extremities, sensation intact all 4 extremities, plantars down going.  4. Eyes :  anicteric sclerae, moist conjunctivae with no lid lag. PERRLA.  5. ENMT:  Oropharynx clear with moist mucous membranes and good dentition  6. Neck:  supple, no cervical lymphadenopathy appriciated, No thyromegaly  7. Respiratory : Normal  respiratory effort, good air movement bilaterally,clear to  auscultation bilaterally  8. Cardiovascular : RRR, no gallops, rubs or murmurs, no leg edema  9. Gastrointestinal:  Positive bowel sounds, abdomen soft, non-tender to palpation,no hepatosplenomegaly, no rigidity or guarding  10. Skin:  No cyanosis, normal texture and turgor, no rash, lesions or ulcers  11.Musculoskeletal:  Good muscle tone,  joints appear normal , no effusions,  normal range of motion    Data Review:    CBC Recent Labs  Lab 09/11/18 0846 09/14/18 1107 09/15/18 1941  WBC 4.0 2.9* 5.5  HGB 13.1 12.0 10.8*  HCT 40.1 35.6* 32.0*  PLT 211 186 230  MCV 88.1 87.7 86.7  MCH 28.8 29.6 29.3  MCHC 32.7 33.7 33.8  RDW 14.8 14.7 14.9  LYMPHSABS  --  0.3 0.5*  MONOABS  --  0.7 0.5  EOSABS  --  0.0 0.0  BASOSABS  --  0.0 0.0   ------------------------------------------------------------------------------------------------------------------  Results for orders placed or performed during the hospital encounter of 09/15/18 (from the past 48 hour(s))  Comprehensive metabolic panel     Status: Abnormal   Collection Time: 09/15/18  7:41 PM  Result Value Ref Range   Sodium 132 (L) 135 - 145 mmol/L   Potassium 3.5 3.5 - 5.1 mmol/L    Comment: DELTA CHECK NOTED   Chloride 97 (L) 98 - 111 mmol/L   CO2 25 22 - 32 mmol/L   Glucose, Bld 146 (H) 70 - 99 mg/dL   BUN 7 (L) 8 - 23 mg/dL   Creatinine, Ser 0.44 0.44 - 1.00 mg/dL   Calcium 8.1 (L) 8.9 - 10.3 mg/dL   Total Protein 6.0 (L) 6.5 - 8.1 g/dL   Albumin 2.6 (L) 3.5 - 5.0 g/dL   AST 24 15 - 41 U/L   ALT 18 0 - 44 U/L   Alkaline Phosphatase 75 38 - 126 U/L   Total Bilirubin 0.7 0.3 - 1.2 mg/dL   GFR calc non Af Amer >60 >60 mL/min   GFR calc Af Amer >60 >60 mL/min    Comment: (NOTE) The eGFR has been calculated using the CKD EPI equation. This calculation has not been validated in all clinical situations. eGFR's persistently <60 mL/min signify possible  Chronic Kidney Disease.    Anion gap 10 5 - 15    Comment: Performed at [facility], [address] [place], [place] [address]  CBC WITH DIFFERENTIAL     Status: Abnormal   Collection Time: 09/15/18  7:41 PM  Result Value Ref Range   WBC 5.5 4.0 - 10.5 K/uL   RBC 3.69 (L) 3.87 - 5.11 MIL/uL   Hemoglobin 10.8 (L) 12.0 - 15.0 g/dL   HCT 32.0 (L) 36.0 - 46.0 %   MCV 86.7 78.0 - 100.0 fL   MCH 29.3 26.0 - 34.0 pg   MCHC 33.8 30.0 - 36.0 g/dL   RDW 14.9 11.5 - 15.5 %   Platelets 230 150 - 400 K/uL   Neutrophils Relative % 81 %   Neutro Abs 4.5 1.7 - 7.7 K/uL   Lymphocytes Relative 9 %   Lymphs Abs 0.5 (L) 0.7 - 4.0 K/uL   Monocytes Relative 9 %   Monocytes Absolute 0.5 0.1 - 1.0 K/uL   Eosinophils Relative 1 %   Eosinophils Absolute 0.0 0.0 - 0.7 K/uL   Basophils Relative 0 %   Basophils Absolute 0.0 0.0 - 0.1 K/uL    Comment: Performed at [facility], [address] [place],  [address]  Lipase, blood     Status: None   Collection Time: 09/15/18  7:41 PM  Result Value Ref Range   Lipase 18 11 - 51 U/L    Comment: Performed at [facility]  [facility], [address], [place], [place] [address]  Troponin I     Status: None   Collection Time: 09/15/18  7:41 PM  Result Value Ref Range   Troponin I <0.03 <0.03 ng/mL    Comment: Performed at [facility], [address] [place], [place] [address]  I-Stat CG4 Lactic Acid, ED  (not at  [facility])     Status: None   Collection Time: 09/15/18  8:02 PM  Result Value Ref Range   Lactic Acid, Venous 1.17 0.5 - 1.9 mmol/L    Chemistries  Recent Labs  Lab 09/11/18 0846 09/14/18 1107 09/15/18 1941  NA 136 131* 132*  K 4.2 4.8 3.5  CL 96* 95* 97*  CO2 '26 24 25  '$ GLUCOSE 136* 142* 146*  BUN 9 9 7*  CREATININE 0.56 0.45 0.44  CALCIUM 9.0 8.4* 8.1*  AST  --  35 24  ALT  --  20 18  ALKPHOS  --  80 75  BILITOT  --  1.6* 0.7    ------------------------------------------------------------------------------------------------------------------  ------------------------------------------------------------------------------------------------------------------ GFR: Estimated Creatinine Clearance: 62.1 mL/min (by C-G formula based on SCr of 0.44 mg/dL). Liver Function Tests: Recent Labs  Lab 09/14/18 1107 09/15/18 1941  AST 35 24  ALT 20 18  ALKPHOS 80 75  BILITOT 1.6* 0.7  PROT 6.4* 6.0*  ALBUMIN 3.0* 2.6*   Recent Labs  Lab 09/15/18 1941  LIPASE 18   No results for input(s): AMMONIA in the last 168 hours. Coagulation Profile: Recent Labs  Lab 09/11/18 0846  INR 0.98   Cardiac Enzymes: Recent Labs  Lab 09/15/18 1941  TROPONINI <0.03    --------------------------------------------------------------------------------------------------------------- Urine analysis:    Component Value Date/Time   COLORURINE YELLOW 09/14/2018 1107   APPEARANCEUR HAZY (A) 09/14/2018 1107   LABSPEC 1.018 09/14/2018 1107   PHURINE 7.0 09/14/2018 1107   GLUCOSEU NEGATIVE 09/14/2018 1107   HGBUR NEGATIVE 09/14/2018 1107   BILIRUBINUR NEGATIVE 09/14/2018 1107   KETONESUR 5 (A) 09/14/2018 1107   PROTEINUR NEGATIVE 09/14/2018 1107   NITRITE NEGATIVE 09/14/2018 1107   LEUKOCYTESUR NEGATIVE 09/14/2018 1107      Imaging Results:    Dg Chest 2 View  Result Date: 09/15/2018 CLINICAL DATA:  Fever, vomiting EXAM: CHEST - 2 VIEW COMPARISON:  09/14/2018 FINDINGS: Right Port-A-Cath remains in place, unchanged. Heart is normal size. Interstitial and airspace opacities in the left upper lobe concerning for pneumonia. No confluent opacity on the right. No effusions or acute bony abnormality. IMPRESSION: Interstitial and airspace opacities in the left upper lobe concerning for pneumonia. Electronically Signed   By: Rolm Baptise M.D.   On: 09/15/2018 19:33   Dg Chest Port 1 View  Result Date: 09/14/2018 CLINICAL DATA:   Nausea, vomiting weakness right non-small-cell lung carcinoma EXAM: PORTABLE CHEST 1 VIEW COMPARISON:  07/04/2018 FINDINGS: New right IJ port catheter tip SVC RA junction. Known medial right upper lobe lung mass by PET-CT is superimposed over the right hilum and not clearly defined by plain radiography. Stable heart size and vascularity. Mild diffuse interstitial prominence, nonspecific. No large effusion or pneumothorax. Trachea is midline. No acute osseous finding. IMPRESSION: Stable chest exam without superimposed acute process. Electronically Signed   By: Jerilynn Mages.  Shick M.D.   On: 09/14/2018 11:20   Dg Abd 2 Views  Result Date: 09/15/2018 CLINICAL DATA:  Nausea, vomiting EXAM: ABDOMEN - 2 VIEW COMPARISON:  CT 07/04/2018 FINDINGS: Nonobstructive bowel gas pattern. No free air or organomegaly. No suspicious calcification. Airspace opacities again noted in the left  lung as seen on chest x-ray. IMPRESSION: No evidence of bowel obstruction or free air. Electronically Signed   By: Rolm Baptise M.D.   On: 09/15/2018 19:34    My personal review of EKG: Rhythm NSR   Assessment & Plan:    Active Problems:   Sepsis ([place])   1. Sepsis-likely source of infection is pneumonia.  Patient started on vancomycin and cefepime in the ED.  Will continue with antibiotics.  IV normal saline at 125 mL/h.  Follow blood culture and urine culture results.  Patient  W BCs 5.5  2. Community-acquired pneumonia-patient started on antibiotics as above.  Follow blood culture results.   3. Dizziness-patient has persistent dizziness with vomiting, will obtain CT head with and without contrast to evaluate brain metastasis.  4. Hypothyroidism-continue Synthroid     DVT Prophylaxis-   Lovenox   AM Labs Ordered, also please review Full Orders  Family Communication: Admission, patients condition and plan of care including tests being ordered have been discussed with the patient  who indicate understanding and agree with the plan  and Code Status.  Code Status: DNR  Admission status: Inpatient  Time spent in minutes : 60 minutes   Oswald Hillock M.D on 09/15/2018 at 9:39 PM  Between 7am to 7pm - Pager - [phone]. After 7pm go to [email or web address] - password [facility]  [facility] - Office  [phone]

## 2018-09-15 NOTE — ED Notes (Signed)
Pt broke out in hives, denies itching and throat itching, no change in voice. Stopped vancomycin and metronidazole. Two boluses going through both the R chest port and L hand IV. Dr. Thurnell Garbe notified and assessed pt. Dr. Lisabeth Devoid notified, no additional orders given other than observe.

## 2018-09-15 NOTE — ED Notes (Signed)
Unable to obtain orthostatic VS, pt unable to stand

## 2018-09-15 NOTE — ED Triage Notes (Addendum)
Pt reports continued emesis, weakness. Pt seen for same yesterday. Pt given zofran po, phenergan po and phenergan suppository. Pt reports has not tried phenergan suppository. cbg en route 116.

## 2018-09-15 NOTE — Progress Notes (Signed)
Pharmacy Antibiotic Note  ANALLELY Vincent is a 67 y.o. female  cancer patient admitted on 09/15/2018 with infection of unknown source.  Pharmacy has been consulted for  vancomycin and  cefepime  dosing.  Plan: Start cefepime 2g IV q12h (febrile neutropenia dosing, adjusted for CrClest~22ml/min) Loading dose:  vancomycin 1.5g IV x1 dose Maintenance dose:  vancomycin 1g IV q24h Goal vancomycin trough range: 15-20   mcg/mL Pharmacy will continue to monitor renal function, vancomycin troughs as clinically appropriate, cultures and patient progress.   Height: 5\' 2"  (157.5 cm) Weight: 143 lb 4.8 oz (65 kg) IBW/kg (Calculated) : 50.1  Temp (24hrs), Avg:100.5 F (38.1 C), Min:98.7 F (37.1 C), Max:102.7 F (39.3 C)  Recent Labs  Lab 09/11/18 0846 09/14/18 1107  WBC 4.0 2.9*  CREATININE 0.56 0.45    Estimated Creatinine Clearance: 62.1 mL/min (by C-G formula based on SCr of 0.45 mg/dL).    Allergies  Allergen Reactions  . Avelox [Moxifloxacin Hcl In Nacl] Itching    Antimicrobials this admission: metronidazole 9/22 >>  cefepime 9/22 >>   vancomycin 9/22>>  Microbiology results: 9/22 BCx2: pending 9/22 UCx: pending 9/21 UCx: NG (final)   Thank you for allowing pharmacy to participate in  this patient's care.  Despina Pole, Pharm. D. Clinical Pharmacist 09/15/2018 7:35 PM

## 2018-09-16 ENCOUNTER — Encounter (HOSPITAL_COMMUNITY): Payer: Self-pay | Admitting: Hematology

## 2018-09-16 DIAGNOSIS — J189 Pneumonia, unspecified organism: Secondary | ICD-10-CM

## 2018-09-16 DIAGNOSIS — E890 Postprocedural hypothyroidism: Secondary | ICD-10-CM

## 2018-09-16 DIAGNOSIS — C7931 Secondary malignant neoplasm of brain: Secondary | ICD-10-CM

## 2018-09-16 LAB — CBC
HEMATOCRIT: 30.2 % — AB (ref 36.0–46.0)
Hemoglobin: 9.9 g/dL — ABNORMAL LOW (ref 12.0–15.0)
MCH: 28.7 pg (ref 26.0–34.0)
MCHC: 32.8 g/dL (ref 30.0–36.0)
MCV: 87.5 fL (ref 78.0–100.0)
Platelets: 243 10*3/uL (ref 150–400)
RBC: 3.45 MIL/uL — ABNORMAL LOW (ref 3.87–5.11)
RDW: 15 % (ref 11.5–15.5)
WBC: 5.9 10*3/uL (ref 4.0–10.5)

## 2018-09-16 LAB — COMPREHENSIVE METABOLIC PANEL
ALK PHOS: 68 U/L (ref 38–126)
ALT: 16 U/L (ref 0–44)
ANION GAP: 9 (ref 5–15)
AST: 20 U/L (ref 15–41)
Albumin: 2.3 g/dL — ABNORMAL LOW (ref 3.5–5.0)
BILIRUBIN TOTAL: 0.7 mg/dL (ref 0.3–1.2)
BUN: 6 mg/dL — ABNORMAL LOW (ref 8–23)
CALCIUM: 7.7 mg/dL — AB (ref 8.9–10.3)
CO2: 23 mmol/L (ref 22–32)
Chloride: 106 mmol/L (ref 98–111)
Creatinine, Ser: 0.35 mg/dL — ABNORMAL LOW (ref 0.44–1.00)
GFR calc non Af Amer: >60 mL/min (ref >60)
Glucose, Bld: 122 mg/dL — ABNORMAL HIGH (ref 70–99)
POTASSIUM: 3.1 mmol/L — AB (ref 3.5–5.1)
Sodium: 138 mmol/L (ref 135–145)
TOTAL PROTEIN: 5.4 g/dL — AB (ref 6.5–8.1)

## 2018-09-16 LAB — MRSA PCR SCREENING: MRSA BY PCR: NEGATIVE

## 2018-09-16 MED ORDER — IOPAMIDOL (ISOVUE-300) INJECTION 61%
75.0000 mL | Freq: Once | INTRAVENOUS | Status: AC | PRN
  Administered 2018-09-16: 75 mL via INTRAVENOUS

## 2018-09-16 MED ORDER — VANCOMYCIN HCL IN DEXTROSE 1-5 GM/200ML-% IV SOLN: 1000.0000 mg | INTRAVENOUS | Status: DC

## 2018-09-16 MED ORDER — ACETAMINOPHEN 325 MG PO TABS
650.0000 mg | ORAL_TABLET | Freq: Four times a day (QID) | ORAL | Status: DC | PRN
  Administered 2018-09-16 – 2018-09-23 (×9): 650 mg via ORAL
  Filled 2018-09-16 (×9): qty 2

## 2018-09-16 NOTE — Care Management Important Message (Signed)
Important Message  Patient Details  Name: Theresa Vincent MRN: [id number] Date of Birth: September 22, 1951   Medicare Important Message Given:  Yes    Shelda Altes 09/16/2018, 11:28 AM

## 2018-09-16 NOTE — Progress Notes (Signed)
Pt has reddened circular bruised area to right bicep. It has a pin point in the center and a knot can be felt upon palpation. Notified Dr. Jerilee Hoh who will access further at next round.

## 2018-09-16 NOTE — Progress Notes (Signed)
PROGRESS NOTE    Theresa Vincent  WUJ:[id number] DOB: [birth year] DOA: 09/15/2018 PCP: Sharilyn Sites, MD     Brief Narrative:  Patient is a 67 year old woman admitted from home on 9/22 due to weakness, dizziness and nausea.  She has a history of lung cancer metastatic to brain who has completed her radiation treatment and just a week ago had her first chemotherapy treatment.  She states she was discharged on Zofran and Phenergan and those have not helped her nausea.  She notes that she has been unable to keep up with her hydration status and feels very dehydrated.  She met sepsis criteria on admission and was admitted for further evaluation and management.   Assessment & Plan:   Active Problems:   HCAP (healthcare-associated pneumonia)   Hypothyroidism following radioiodine therapy   Non-small cell lung cancer, right ([place])   Brain metastases ([facility] [place])   Sepsis ([place])   Sepsis due to healthcare associated pneumonia -Patient was started on vancomycin and cefepime in the emergency department. -Appears to have had an "allergic reaction" after vancomycin infusion, this was discontinued and was given Zyvox instead.  This was likely not a true allergy and instead "red man" syndrome.  Nonetheless her MRSA PCR is negative and will discontinue MRSA coverage. -Plan to continue cefepime alone for now.  Hypothyroidism  -Continue Synthroid  Dizziness -Suspect due to dehydration and pneumonia. -Nonetheless given her history of CT scan of the brain was ordered today which shows decrease in size of both brain metastases and vasogenic edema.  Lung cancer metastatic to brain -Outpatient follow-up with oncology as scheduled. -May need to delay next chemotherapy treatment.   DVT prophylaxis: Lovenox Code Status: DNR Family Communication: Sister is at bedside updated on plan of care and questions answered Disposition Plan: Transfer to floor today.  Consultants:   None  Procedures:    None  Antimicrobials:  Anti-infectives (From admission, onward)   Start     Dose/Rate Route Frequency Ordered Stop   09/16/18 1800  vancomycin (VANCOCIN) IVPB 1000 mg/200 mL premix  Status:  Discontinued     1,000 mg 200 mL/hr over 60 Minutes Intravenous Every 24 hours 09/15/18 1951 09/16/18 0941   09/16/18 1800  vancomycin (VANCOCIN) IVPB 1000 mg/200 mL premix  Status:  Discontinued     1,000 mg 200 mL/hr over 60 Minutes Intravenous Every 24 hours 09/16/18 0942 09/16/18 1110   09/16/18 0800  ceFEPIme (MAXIPIME) 2 g in sodium chloride 0.9 % 100 mL IVPB     2 g 200 mL/hr over 30 Minutes Intravenous Every 12 hours 09/15/18 1951     09/16/18 0800  meropenem (MERREM) 1 g in sodium chloride 0.9 % 100 mL IVPB     1 g 200 mL/hr over 30 Minutes Intravenous  Once 09/15/18 2233 09/16/18 0942   09/15/18 2245  linezolid (ZYVOX) IVPB 600 mg     600 mg 300 mL/hr over 60 Minutes Intravenous  Once 09/15/18 2235 09/16/18 0141   09/15/18 2000  vancomycin (VANCOCIN) 1,500 mg in sodium chloride 0.9 % 500 mL IVPB     1,500 mg 250 mL/hr over 120 Minutes Intravenous  Once 09/15/18 1951 09/15/18 2136   09/15/18 1915  ceFEPIme (MAXIPIME) 2 g in sodium chloride 0.9 % 100 mL IVPB     2 g 200 mL/hr over 30 Minutes Intravenous  Once 09/15/18 1901 09/15/18 2040   09/15/18 1915  metroNIDAZOLE (FLAGYL) IVPB 500 mg  Status:  Discontinued     500 mg 100  mL/hr over 60 Minutes Intravenous Every 8 hours 09/15/18 1901 09/15/18 2201   09/15/18 1915  vancomycin (VANCOCIN) IVPB 1000 mg/200 mL premix  Status:  Discontinued     1,000 mg 200 mL/hr over 60 Minutes Intravenous  Once 09/15/18 1901 09/15/18 1949       Subjective: Lying in bed, feels improved, no longer nauseous.  Objective: Vitals:   09/16/18 0733 09/16/18 0800 09/16/18 1000 09/16/18 1137  BP:  129/65    Pulse: (!) 102 93  95  Resp: (!) 22 (!) 24  15  Temp: (!) 102.9 F (39.4 C)  99.5 F (37.5 C) 98 F (36.7 C)  TempSrc: Oral  Oral Oral  SpO2:  96% 96%  97%  Weight:      Height:        Intake/Output Summary (Last 24 hours) at 09/16/2018 1727 Last data filed at 09/16/2018 0700 Gross per 24 hour  Intake 1084.32 ml  Output 700 ml  Net 384.32 ml   Filed Weights   09/15/18 1830 09/15/18 2207 09/16/18 0600  Weight: 65 kg 65.2 kg 65.2 kg    Examination:  General exam: Alert, awake, oriented x 3 Respiratory system: Clear to auscultation. Respiratory effort normal. Cardiovascular system:RRR. No murmurs, rubs, gallops. Gastrointestinal system: Abdomen is nondistended, soft and nontender. No organomegaly or masses felt. Normal bowel sounds heard. Central nervous system: Alert and oriented. No focal neurological deficits. Extremities: No C/C/E, +pedal pulses Skin: No rashes, lesions or ulcers Psychiatry: Judgement and insight appear normal. Mood & affect appropriate.     Data Reviewed: I have personally reviewed following labs and imaging studies  CBC: Recent Labs  Lab 09/11/18 0846 09/14/18 1107 09/15/18 1941 09/16/18 0424  WBC 4.0 2.9* 5.5 5.9  NEUTROABS  --  1.8 4.5  --   HGB 13.1 12.0 10.8* 9.9*  HCT 40.1 35.6* 32.0* 30.2*  MCV 88.1 87.7 86.7 87.5  PLT 211 186 230 553   Basic Metabolic Panel: Recent Labs  Lab 09/11/18 0846 09/14/18 1107 09/15/18 1941 09/16/18 0424  NA 136 131* 132* 138  K 4.2 4.8 3.5 3.1*  CL 96* 95* 97* 106  CO2 _0 GLUCOSE 136* 142* 146* 122*  BUN 9 9 7* 6*  CREATININE 0.56 0.45 0.44 0.35*  CALCIUM 9.0 8.4* 8.1* 7.7*   GFR: Estimated Creatinine Clearance: 62.1 mL/min (A) (by C-G formula based on SCr of 0.35 mg/dL (L)). Liver Function Tests: Recent Labs  Lab 09/14/18 1107 09/15/18 1941 09/16/18 0424  AST 35 24 20  ALT _1 ALKPHOS 80 75 68  BILITOT 1.6* 0.7 0.7  PROT 6.4* 6.0* 5.4*  ALBUMIN 3.0* 2.6* 2.3*   Recent Labs  Lab 09/15/18 1941  LIPASE 18   No results for input(s): AMMONIA in the last 168 hours. Coagulation Profile: Recent Labs  Lab  09/11/18 0846  INR 0.98   Cardiac Enzymes: Recent Labs  Lab 09/15/18 1941  TROPONINI <0.03   BNP (last 3 results) No results for input(s): PROBNP in the last 8760 hours. HbA1C: No results for input(s): HGBA1C in the last 72 hours. CBG: No results for input(s): GLUCAP in the last 168 hours. Lipid Profile: No results for input(s): CHOL, HDL, LDLCALC, TRIG, CHOLHDL, LDLDIRECT in the last 72 hours. Thyroid Function Tests: No results for input(s): TSH, T4TOTAL, FREET4, T3FREE, THYROIDAB in the last 72 hours. Anemia Panel: No results for input(s): VITAMINB12, FOLATE, FERRITIN, TIBC, IRON, RETICCTPCT in the last 72 hours. Urine analysis:  Component Value Date/Time   COLORURINE YELLOW 09/15/2018 2229   APPEARANCEUR CLEAR 09/15/2018 2229   LABSPEC 1.005 09/15/2018 2229   PHURINE 7.0 09/15/2018 2229   GLUCOSEU NEGATIVE 09/15/2018 2229   HGBUR NEGATIVE 09/15/2018 2229   BILIRUBINUR NEGATIVE 09/15/2018 2229   KETONESUR NEGATIVE 09/15/2018 2229   PROTEINUR NEGATIVE 09/15/2018 2229   NITRITE NEGATIVE 09/15/2018 2229   LEUKOCYTESUR NEGATIVE 09/15/2018 2229   Sepsis Labs: _0 (procalcitonin:4,lacticidven:4)  ) Recent Results (from the past 240 hour(s))  Urine Culture     Status: None   Collection Time: 09/14/18 11:07 AM  Result Value Ref Range Status   Specimen Description   Final    URINE, CLEAN CATCH Performed at [facility], [address], [place], [place] [address]    Special Requests   Final    NONE Performed at [facility], [address] [place], [place] [address]    Culture   Final    NO GROWTH Performed at [facility], [place] [address], [place], [place] [address]    Report Status 09/15/2018 FINAL  Final  Blood Culture (routine x 2)     Status: None (Preliminary result)   Collection Time: 09/15/18  7:41 PM  Result Value Ref Range Status   Specimen Description PORTA CATH  Final   Special Requests   Final    BOTTLES DRAWN AEROBIC AND ANAEROBIC Blood  Culture adequate volume   Culture   Final    NO GROWTH < 12 HOURS Performed at [facility], [address], [place], [place] [address]    Report Status PENDING  Incomplete  Blood Culture (routine x 2)     Status: None (Preliminary result)   Collection Time: 09/15/18  7:55 PM  Result Value Ref Range Status   Specimen Description BLOOD LEFT HAND  Final   Special Requests   Final    BOTTLES DRAWN AEROBIC AND ANAEROBIC Blood Culture adequate volume   Culture   Final    NO GROWTH < 12 HOURS Performed at [facility], [address] [place], [place] [address]    Report Status PENDING  Incomplete  MRSA PCR Screening     Status: None   Collection Time: 09/15/18 10:02 PM  Result Value Ref Range Status   MRSA by PCR NEGATIVE NEGATIVE Final    Comment:        The GeneXpert MRSA Assay (FDA approved for NASAL specimens only), is one component of a comprehensive MRSA colonization surveillance program. It is not intended to diagnose MRSA infection nor to guide or monitor treatment for MRSA infections. Performed at [facility], [address], [place], [place] [address]          Radiology Studies: Dg Chest 2 View  Result Date: 09/15/2018 CLINICAL DATA:  Fever, vomiting EXAM: CHEST - 2 VIEW COMPARISON:  09/14/2018 FINDINGS: Right Port-A-Cath remains in place, unchanged. Heart is normal size. Interstitial and airspace opacities in the left upper lobe concerning for pneumonia. No confluent opacity on the right. No effusions or acute bony abnormality. IMPRESSION: Interstitial and airspace opacities in the left upper lobe concerning for pneumonia. Electronically Signed   By: Rolm Baptise M.D.   On: 09/15/2018 19:33   Ct Head W Or Wo Contrast  Result Date: 09/16/2018 CLINICAL DATA:  67 y/o F; history of lung cancer with brain metastasis. Recurrent nausea and vomiting. EXAM: CT HEAD WITHOUT AND WITH CONTRAST TECHNIQUE: Contiguous axial images were obtained from the base of the skull through  the vertex  without and with intravenous contrast CONTRAST:  71m ISOVUE-300 IOPAMIDOL (ISOVUE-300) INJECTION 61% COMPARISON:  07/24/2018 MRI of the head. FINDINGS: Brain: Numerous enhancing brain metastasis peripheral increased in size when compared with the prior MRI of the brain. For example a lesion within the left medial cerebellum measures 14 mm, previously 25 mm (series 3, image 12) and a lesion in the right occipital lobe measures 9 mm, previously 14 mm (series 3, image 19). No interval hemorrhage or stroke. The larger lesions are associated with surrounding white matter hypodensity compatible with vasogenic edema which is mildly decreased when compared with the prior MRI of the brain. No extra-axial collection, hydrocephalus, or herniation. Vascular: No hyperdense vessel or unexpected calcification. Visible vessels are patent. Skull: Normal. Negative for fracture or focal lesion. Sinuses/Orbits: Right frontal, ethmoid, and maxillary sinus partial opacification. Additional paranasal sinuses and the mastoid air cells are normally aerated. Orbits are unremarkable. Other: None. IMPRESSION: 1. Numerous enhancing brain metastasis are overall decreased in size when compared with the prior MRI of the brain. Associated vasogenic edema is also mildly decreased given differences in technique. No new metastasis identified. 2. No new acute intracranial abnormality identified. Electronically Signed   By: LKristine GarbeM.D.   On: 09/16/2018 02:37   Dg Abd 2 Views  Result Date: 09/15/2018 CLINICAL DATA:  Nausea, vomiting EXAM: ABDOMEN - 2 VIEW COMPARISON:  CT 07/04/2018 FINDINGS: Nonobstructive bowel gas pattern. No free air or organomegaly. No suspicious calcification. Airspace opacities again noted in the left lung as seen on chest x-ray. IMPRESSION: No evidence of bowel obstruction or free air. Electronically Signed   By: KRolm BaptiseM.D.   On: 09/15/2018 19:34        Scheduled Meds: . enoxaparin  (LOVENOX) injection  40 mg Subcutaneous Q24H  . folic acid  1 mg Oral Daily  . levothyroxine  25 mcg Oral QAC breakfast   Continuous Infusions: . sodium chloride 125 mL/hr at 09/16/18 0300  . sodium chloride Stopped (09/15/18 2106)  . ceFEPime (MAXIPIME) IV 2 g (09/16/18 0907)     LOS: 1 day    Time spent: 35 minutes. Greater than 50% of this time was spent in direct contact with the patient and with patient's family, coordinating care and discussing relevant ongoing clinical issues, including improvement in sepsis parameters, plan to discontinue vancomycin and plan to transfer to floor for continued monitoring.     ELelon Frohlich MD [facility] Pager [phone] If 7PM-7AM, please contact night-coverage [email or web address] Password [facility]9/23/2019, 5:26 PM

## 2018-09-17 LAB — CBC
HCT: 27.3 % — ABNORMAL LOW (ref 36.0–46.0)
HEMOGLOBIN: 9.2 g/dL — AB (ref 12.0–15.0)
MCH: 29.4 pg (ref 26.0–34.0)
MCHC: 33.7 g/dL (ref 30.0–36.0)
MCV: 87.2 fL (ref 78.0–100.0)
Platelets: 275 10*3/uL (ref 150–400)
RBC: 3.13 MIL/uL — AB (ref 3.87–5.11)
RDW: 15.3 % (ref 11.5–15.5)
WBC: 7.5 10*3/uL (ref 4.0–10.5)

## 2018-09-17 LAB — URINE CULTURE

## 2018-09-17 LAB — BASIC METABOLIC PANEL
ANION GAP: 10 (ref 5–15)
BUN: 5 mg/dL — ABNORMAL LOW (ref 8–23)
CHLORIDE: 104 mmol/L (ref 98–111)
CO2: 23 mmol/L (ref 22–32)
CREATININE: 0.3 mg/dL — AB (ref 0.44–1.00)
Calcium: 7.4 mg/dL — ABNORMAL LOW (ref 8.9–10.3)
GFR calc non Af Amer: >60 mL/min (ref >60)
Glucose, Bld: 106 mg/dL — ABNORMAL HIGH (ref 70–99)
POTASSIUM: 2.3 mmol/L — AB (ref 3.5–5.1)
Sodium: 137 mmol/L (ref 135–145)

## 2018-09-17 LAB — MAGNESIUM: Magnesium: 1.6 mg/dL — ABNORMAL LOW (ref 1.7–2.4)

## 2018-09-17 LAB — HIV ANTIBODY (ROUTINE TESTING W REFLEX): HIV Screen 4th Generation wRfx: NONREACTIVE

## 2018-09-17 MED ORDER — POTASSIUM CHLORIDE CRYS ER 20 MEQ PO TBCR
40.0000 meq | EXTENDED_RELEASE_TABLET | ORAL | Status: AC
  Administered 2018-09-17 (×4): 40 meq via ORAL
  Filled 2018-09-17 (×4): qty 2

## 2018-09-17 MED ORDER — GUAIFENESIN-DM 100-10 MG/5ML PO SYRP
5.0000 mL | ORAL_SOLUTION | ORAL | Status: DC | PRN
  Administered 2018-09-17: 5 mL via ORAL
  Filled 2018-09-17: qty 5

## 2018-09-17 MED ORDER — MAGNESIUM SULFATE 2 GM/50ML IV SOLN
2.0000 g | Freq: Once | INTRAVENOUS | Status: AC
  Administered 2018-09-17: 2 g via INTRAVENOUS
  Filled 2018-09-17: qty 50

## 2018-09-17 NOTE — Evaluation (Signed)
Physical Therapy Evaluation Patient Details Name: Theresa Vincent MRN: [id number] DOB: 1951/01/26 Today's Date: 09/17/2018   History of Present Illness  Theresa Vincent  is a 67 y.o. female, with history of non-small cell lung cancer, poorly differentiated with extensive brain metastasis underwent 10 rounds of radiation followed by first round of chemotherapy this month.  Patient has been having dizziness with nausea and vomiting for past 1 month which has been persistent.  Nothing seems to make it better.  Today in the ED again patient came with dizziness with nausea and vomiting.    Clinical Impression  Patient limited for functional mobility as stated below secondary to BLE weakness, fatigue and poor standing balance.  Patient will benefit from continued physical therapy in hospital and recommended venue below to increase strength, balance, endurance for safe ADLs and gait.    Follow Up Recommendations SNF    Equipment Recommendations  None recommended by PT    Recommendations for Other Services       Precautions / Restrictions Precautions Precautions: Fall Restrictions Weight Bearing Restrictions: No      Mobility  Bed Mobility Overal bed mobility: Needs Assistance Bed Mobility: Supine to Sit     Supine to sit: Min assist;Mod assist     General bed mobility comments: slow labored movement  Transfers Overall transfer level: Needs assistance Equipment used: Rolling walker (2 wheeled) Transfers: Sit to/from [facility] Sit to Stand: Mod assist Stand pivot transfers: Mod assist       General transfer comment: unsteady on feet  Ambulation/Gait Ambulation/Gait assistance: Mod assist Gait Distance (Feet): 3 Feet Assistive device: 1 person hand held assist Gait Pattern/deviations: Decreased step length - right;Decreased step length - left;Decreased stride length Gait velocity: slow   General Gait Details: limited to 4-5 unsteady slow labored side steps  during transfer to chair secondary to BLE weakness, fatigue  Stairs            Wheelchair Mobility    Modified Rankin (Stroke Patients Only)       Balance Overall balance assessment: Needs assistance Sitting-balance support: Feet supported;No upper extremity supported Sitting balance-Leahy Scale: Fair     Standing balance support: Bilateral upper extremity supported;During functional activity Standing balance-Leahy Scale: Fair Standing balance comment: using RW                             Pertinent Vitals/Pain Pain Assessment: 0-10 Pain Score: 2  Pain Location: chest pain Pain Descriptors / Indicators: Tightness;Discomfort Pain Intervention(s): Limited activity within patient's tolerance;Monitored during session    [place] expects to be discharged to:: Private residence Living Arrangements: Children(daughter) Available Help at Discharge: Family Type of Home: Mobile home Home Access: [place]: One [place]: Environmental consultant - 2 wheels;Wheelchair - manual;Shower seat;Bedside commode      Prior Function Level of Independence: Independent with assistive device(s)         Comments: household and short distanced community ambulator with RW     Hand Dominance        Extremity/Trunk Assessment   Upper Extremity Assessment Upper Extremity Assessment: Generalized weakness    Lower Extremity Assessment Lower Extremity Assessment: Generalized weakness    Cervical / Trunk Assessment Cervical / Trunk Assessment: Normal  Communication   Communication: No difficulties  Cognition Arousal/Alertness: Awake/alert Behavior During Therapy: WFL for tasks assessed/performed Overall Cognitive Status: Within Functional Limits for tasks assessed  General Comments      Exercises     Assessment/Plan    PT Assessment Patient needs continued PT services   PT Problem List Decreased strength;Decreased balance;Decreased mobility;Decreased activity tolerance       PT Treatment Interventions Gait training;Stair training;Functional mobility training;Therapeutic activities;Therapeutic exercise;Patient/family education    PT Goals (Current goals can be found in the Care Plan section)  Acute Rehab PT Goals Patient Stated Goal: return home with family to assist after rehab PT Goal Formulation: With patient Time For Goal Achievement: 10/01/18 Potential to Achieve Goals: Good    Frequency Min 3X/week   Barriers to discharge        Co-evaluation               AM-PAC PT "6 Clicks" Daily Activity  Outcome Measure Difficulty turning over in bed (including adjusting bedclothes, sheets and blankets)?: A Little Difficulty moving from lying on back to sitting on the side of the bed? : A Lot Difficulty sitting down on and standing up from a chair with arms (e.g., wheelchair, bedside commode, etc,.)?: A Lot Help needed moving to and from a bed to chair (including a wheelchair)?: A Lot Help needed walking in hospital room?: A Lot Help needed climbing 3-5 steps with a railing? : A Lot 6 Click Score: 13    End of Session Equipment Utilized During Treatment: Oxygen Activity Tolerance: Patient tolerated treatment well;Patient limited by fatigue Patient left: in chair;with call bell/phone within reach Nurse Communication: Mobility status;Other (comment)(RN notified that patient left up in chair) PT Visit Diagnosis: Unsteadiness on feet (R26.81);Other abnormalities of gait and mobility (R26.89);Muscle weakness (generalized) (M62.81)    Time: [id number] PT Time Calculation (min) (ACUTE ONLY): 28 min   Charges:   PT Evaluation $PT Eval Moderate Complexity: 1 Mod PT Treatments $Therapeutic Activity: 23-37 mins        4:12 PM, 09/17/18 Lonell Grandchild, MPT Physical Therapist with [facility] [phone] office [phone]  mobile phone

## 2018-09-17 NOTE — Progress Notes (Signed)
CRITICAL VALUE ALERT  Critical Value:  2.3  Night shift paged night MD with and received no new orders. Repaging day time MD, Jerilee Hoh  Provider Notified: Jerilee Hoh  Orders Received/Actions taken: Awaiting orders

## 2018-09-17 NOTE — Progress Notes (Signed)
PROGRESS NOTE    Theresa Vincent  VEL:[id number] DOB: 21-Apr-1951 DOA: 09/15/2018 PCP: Sharilyn Sites, MD     Brief Narrative:  Patient is a 67 year old woman admitted from home on 9/22 due to weakness, dizziness and nausea.  She has a history of lung cancer metastatic to brain who has completed her radiation treatment and just a week ago had her first chemotherapy treatment.  She states she was discharged on Zofran and Phenergan and those have not helped her nausea.  She notes that she has been unable to keep up with her hydration status and feels very dehydrated.  She met sepsis criteria on admission and was admitted for further evaluation and management.  As of 9/24 she is significantly improved, is requesting diet advancement, has not had any further fevers.   Assessment & Plan:   Active Problems:   HCAP (healthcare-associated pneumonia)   Hypothyroidism following radioiodine therapy   Non-small cell lung cancer, right ([place])   Brain metastases ([place])   Sepsis ([place])   Sepsis due to healthcare associated pneumonia -Patient was started on vancomycin and cefepime in the emergency department. -Appears to have had an "allergic reaction" after vancomycin infusion, this was discontinued and was given Zyvox instead.  This was likely not a true allergy and instead "red man" syndrome.  Nonetheless her MRSA PCR is negative and will discontinue MRSA coverage. -Plan to continue cefepime alone for now.  Hypothyroidism  -Continue Synthroid  Dizziness -Suspect due to dehydration and pneumonia. -Nonetheless given her history ,CT scan of the brain was ordered  which shows decrease in size of both brain metastases and vasogenic edema.  Lung cancer metastatic to brain -Outpatient follow-up with oncology as scheduled. -May need to delay next chemotherapy treatment.  Electrolyte disturbances -Patient with both significant hypokalemia and hypomagnesemia with levels of 2.3 and 1.6  respectively. -These will be replaced today, will recheck levels in a.m.   DVT prophylaxis: Lovenox Code Status: DNR Family Communication: Sister is at bedside updated on plan of care and questions answered Disposition Plan: Anticipate discharge home in 24 hours if able to tolerate diet and continues to remain clinically improved.  We are also repleting electrolytes today so these need to be rechecked prior to discharge.  PT evaluation is pending for today.  Consultants:   None  Procedures:   None  Antimicrobials:  Anti-infectives (From admission, onward)   Start     Dose/Rate Route Frequency Ordered Stop   09/16/18 1800  vancomycin (VANCOCIN) IVPB 1000 mg/200 mL premix  Status:  Discontinued     1,000 mg 200 mL/hr over 60 Minutes Intravenous Every 24 hours 09/15/18 1951 09/16/18 0941   09/16/18 1800  vancomycin (VANCOCIN) IVPB 1000 mg/200 mL premix  Status:  Discontinued     1,000 mg 200 mL/hr over 60 Minutes Intravenous Every 24 hours 09/16/18 0942 09/16/18 1110   09/16/18 0800  ceFEPIme (MAXIPIME) 2 g in sodium chloride 0.9 % 100 mL IVPB     2 g 200 mL/hr over 30 Minutes Intravenous Every 12 hours 09/15/18 1951     09/16/18 0800  meropenem (MERREM) 1 g in sodium chloride 0.9 % 100 mL IVPB     1 g 200 mL/hr over 30 Minutes Intravenous  Once 09/15/18 2233 09/16/18 0942   09/15/18 2245  linezolid (ZYVOX) IVPB 600 mg     600 mg 300 mL/hr over 60 Minutes Intravenous  Once 09/15/18 2235 09/16/18 0141   09/15/18 2000  vancomycin (VANCOCIN) 1,500 mg in sodium  chloride 0.9 % 500 mL IVPB     1,500 mg 250 mL/hr over 120 Minutes Intravenous  Once 09/15/18 1951 09/15/18 2136   09/15/18 1915  ceFEPIme (MAXIPIME) 2 g in sodium chloride 0.9 % 100 mL IVPB     2 g 200 mL/hr over 30 Minutes Intravenous  Once 09/15/18 1901 09/15/18 2040   09/15/18 1915  metroNIDAZOLE (FLAGYL) IVPB 500 mg  Status:  Discontinued     500 mg 100 mL/hr over 60 Minutes Intravenous Every 8 hours 09/15/18 1901  09/15/18 2201   09/15/18 1915  vancomycin (VANCOCIN) IVPB 1000 mg/200 mL premix  Status:  Discontinued     1,000 mg 200 mL/hr over 60 Minutes Intravenous  Once 09/15/18 1901 09/15/18 1949       Subjective: Lying in bed, feels significantly improved, has tolerated clear liquids without issues.  Objective: Vitals:   09/16/18 2123 09/16/18 2255 09/17/18 0549 09/17/18 1252  BP:   102/60 (!) 113/55  Pulse:   94 (!) 105  Resp:   18 20  Temp:  99.1 F (37.3 C) (!) 101.1 F (38.4 C) 99.6 F (37.6 C)  TempSrc:  Oral Oral Oral  SpO2: 95%  96% 99%  Weight:      Height:        Intake/Output Summary (Last 24 hours) at 09/17/2018 1538 Last data filed at 09/17/2018 1501 Gross per 24 hour  Intake 4471.64 ml  Output -  Net 4471.64 ml   Filed Weights   09/15/18 1830 09/15/18 2207 09/16/18 0600  Weight: 65 kg 65.2 kg 65.2 kg    Examination:  General exam: Alert, awake, oriented x 3 Respiratory system: Clear to auscultation. Respiratory effort normal. Cardiovascular system:RRR. No murmurs, rubs, gallops. Gastrointestinal system: Abdomen is nondistended, soft and nontender. No organomegaly or masses felt. Normal bowel sounds heard. Central nervous system: Alert and oriented. No focal neurological deficits. Extremities: No C/C/E, +pedal pulses Skin: No rashes, lesions or ulcers Psychiatry: Judgement and insight appear normal. Mood & affect appropriate.       Data Reviewed: I have personally reviewed following labs and imaging studies  CBC: Recent Labs  Lab 09/11/18 0846 09/14/18 1107 09/15/18 1941 09/16/18 0424 09/17/18 0521  WBC 4.0 2.9* 5.5 5.9 7.5  NEUTROABS  --  1.8 4.5  --   --   HGB 13.1 12.0 10.8* 9.9* 9.2*  HCT 40.1 35.6* 32.0* 30.2* 27.3*  MCV 88.1 87.7 86.7 87.5 87.2  PLT 211 186 230 243 962   Basic Metabolic Panel: Recent Labs  Lab 09/11/18 0846 09/14/18 1107 09/15/18 1941 09/16/18 0424 09/17/18 0521 09/17/18 0959  NA 136 131* 132* 138 137  --   K  4.2 4.8 3.5 3.1* 2.3*  --   CL 96* 95* 97* 106 104  --   CO2 _0 --   GLUCOSE 136* 142* 146* 122* 106*  --   BUN 9 9 7* 6* 5*  --   CREATININE 0.56 0.45 0.44 0.35* 0.30*  --   CALCIUM 9.0 8.4* 8.1* 7.7* 7.4*  --   MG  --   --   --   --   --  1.6*   GFR: Estimated Creatinine Clearance: 62.1 mL/min (A) (by C-G formula based on SCr of 0.3 mg/dL (L)). Liver Function Tests: Recent Labs  Lab 09/14/18 1107 09/15/18 1941 09/16/18 0424  AST 35 24 20  ALT _1 ALKPHOS 80 75 68  BILITOT 1.6* 0.7 0.7  PROT  6.4* 6.0* 5.4*  ALBUMIN 3.0* 2.6* 2.3*   Recent Labs  Lab 09/15/18 1941  LIPASE 18   No results for input(s): AMMONIA in the last 168 hours. Coagulation Profile: Recent Labs  Lab 09/11/18 0846  INR 0.98   Cardiac Enzymes: Recent Labs  Lab 09/15/18 1941  TROPONINI <0.03   BNP (last 3 results) No results for input(s): PROBNP in the last 8760 hours. HbA1C: No results for input(s): HGBA1C in the last 72 hours. CBG: No results for input(s): GLUCAP in the last 168 hours. Lipid Profile: No results for input(s): CHOL, HDL, LDLCALC, TRIG, CHOLHDL, LDLDIRECT in the last 72 hours. Thyroid Function Tests: No results for input(s): TSH, T4TOTAL, FREET4, T3FREE, THYROIDAB in the last 72 hours. Anemia Panel: No results for input(s): VITAMINB12, FOLATE, FERRITIN, TIBC, IRON, RETICCTPCT in the last 72 hours. Urine analysis:    Component Value Date/Time   COLORURINE YELLOW 09/15/2018 2229   APPEARANCEUR CLEAR 09/15/2018 2229   LABSPEC 1.005 09/15/2018 2229   PHURINE 7.0 09/15/2018 2229   GLUCOSEU NEGATIVE 09/15/2018 2229   HGBUR NEGATIVE 09/15/2018 2229   BILIRUBINUR NEGATIVE 09/15/2018 2229   KETONESUR NEGATIVE 09/15/2018 2229   PROTEINUR NEGATIVE 09/15/2018 2229   NITRITE NEGATIVE 09/15/2018 2229   LEUKOCYTESUR NEGATIVE 09/15/2018 2229   Sepsis Labs: _0 (procalcitonin:4,lacticidven:4)  ) Recent Results (from the past 240 hour(s))  Urine Culture      Status: None   Collection Time: 09/14/18 11:07 AM  Result Value Ref Range Status   Specimen Description   Final    URINE, CLEAN CATCH Performed at [facility], [address] [place], [place] [address]    Special Requests   Final    NONE Performed at [facility], [address] [place], [place] [address]    Culture   Final    NO GROWTH Performed at [facility], [place] [address] [place], [place] [address]    Report Status 09/15/2018 FINAL  Final  Blood Culture (routine x 2)     Status: None (Preliminary result)   Collection Time: 09/15/18  7:41 PM  Result Value Ref Range Status   Specimen Description PORTA CATH DRAWN BY RN  Final   Special Requests   Final    BOTTLES DRAWN AEROBIC AND ANAEROBIC Blood Culture adequate volume   Culture   Final    NO GROWTH 2 DAYS Performed at [facility], [address] [place], [place] [address]    Report Status PENDING  Incomplete  Blood Culture (routine x 2)     Status: None (Preliminary result)   Collection Time: 09/15/18  7:55 PM  Result Value Ref Range Status   Specimen Description BLOOD LEFT HAND  Final   Special Requests   Final    BOTTLES DRAWN AEROBIC AND ANAEROBIC Blood Culture adequate volume   Culture   Final    NO GROWTH 2 DAYS Performed at [facility], [address] [place], [place] [address]    Report Status PENDING  Incomplete  MRSA PCR Screening     Status: None   Collection Time: 09/15/18 10:02 PM  Result Value Ref Range Status   MRSA by PCR NEGATIVE NEGATIVE Final    Comment:        The GeneXpert MRSA Assay (FDA approved for NASAL specimens only), is one component of a comprehensive MRSA colonization surveillance program. It is not intended to diagnose MRSA infection nor to guide or monitor treatment for MRSA infections. Performed at  [facility] [place]  [address] [place], [place] [address]   Urine culture     Status: None   Collection Time: 09/15/18 10:29 PM  Result Value Ref Range Status    Specimen Description   Final    URINE, CLEAN CATCH Performed at [facility], [address] [place], [place] [address]    Special Requests   Final    NONE Performed at [facility], [address], [place], [place] [address]    Culture   Final    Multiple bacterial morphotypes present, none predominant. Suggest appropriate recollection if clinically indicated.   Report Status 09/17/2018 FINAL  Final         Radiology Studies: Dg Chest 2 View  Result Date: 09/15/2018 CLINICAL DATA:  Fever, vomiting EXAM: CHEST - 2 VIEW COMPARISON:  09/14/2018 FINDINGS: Right Port-A-Cath remains in place, unchanged. Heart is normal size. Interstitial and airspace opacities in the left upper lobe concerning for pneumonia. No confluent opacity on the right. No effusions or acute bony abnormality. IMPRESSION: Interstitial and airspace opacities in the left upper lobe concerning for pneumonia. Electronically Signed   By: Rolm Baptise M.D.   On: 09/15/2018 19:33   Ct Head W Or Wo Contrast  Result Date: 09/16/2018 CLINICAL DATA:  67 y/o F; history of lung cancer with brain metastasis. Recurrent nausea and vomiting. EXAM: CT HEAD WITHOUT AND WITH CONTRAST TECHNIQUE: Contiguous axial images were obtained from the base of the skull through the vertex without and with intravenous contrast CONTRAST:  32m ISOVUE-300 IOPAMIDOL (ISOVUE-300) INJECTION 61% COMPARISON:  07/24/2018 MRI of the head. FINDINGS: Brain: Numerous enhancing brain metastasis peripheral increased in size when compared with the prior MRI of the brain. For example a lesion within the left medial cerebellum measures 14 mm, previously 25 mm (series 3, image 12) and a lesion in the right occipital lobe measures 9 mm, previously 14 mm (series 3, image 19). No interval hemorrhage or stroke. The larger lesions are associated with surrounding white matter hypodensity compatible with vasogenic edema which is mildly decreased when compared with the prior MRI of  the brain. No extra-axial collection, hydrocephalus, or herniation. Vascular: No hyperdense vessel or unexpected calcification. Visible vessels are patent. Skull: Normal. Negative for fracture or focal lesion. Sinuses/Orbits: Right frontal, ethmoid, and maxillary sinus partial opacification. Additional paranasal sinuses and the mastoid air cells are normally aerated. Orbits are unremarkable. Other: None. IMPRESSION: 1. Numerous enhancing brain metastasis are overall decreased in size when compared with the prior MRI of the brain. Associated vasogenic edema is also mildly decreased given differences in technique. No new metastasis identified. 2. No new acute intracranial abnormality identified. Electronically Signed   By: LKristine GarbeM.D.   On: 09/16/2018 02:37   Dg Abd 2 Views  Result Date: 09/15/2018 CLINICAL DATA:  Nausea, vomiting EXAM: ABDOMEN - 2 VIEW COMPARISON:  CT 07/04/2018 FINDINGS: Nonobstructive bowel gas pattern. No free air or organomegaly. No suspicious calcification. Airspace opacities again noted in the left lung as seen on chest x-ray. IMPRESSION: No evidence of bowel obstruction or free air. Electronically Signed   By: KRolm BaptiseM.D.   On: 09/15/2018 19:34        Scheduled Meds: . enoxaparin (LOVENOX) injection  40 mg Subcutaneous Q24H  . folic acid  1 mg Oral Daily  . levothyroxine  25 mcg Oral QAC breakfast  . potassium chloride  40 mEq Oral Q4H   Continuous Infusions: . sodium chloride 125 mL/hr at 09/17/18 1501  . sodium chloride Stopped (09/15/18  2106)  . ceFEPime (MAXIPIME) IV Stopped (09/17/18 0829)     LOS: 2 days    Time spent: 35 minutes. Greater than 50% of this time was spent in direct contact with the patient and with patient's daughter, coordinating care and discussing relevant ongoing clinical issues, including improvement in sepsis parameters, plan to continue current antibiotic and replace electrolytes as well as advance diet, we have also  discussed possible discharge home in a.m. pending clinical improvement and PT assessment recommendations.    Lelon Frohlich, MD [facility] Pager [phone]  If 7PM-7AM, please contact night-coverage [email or web address] Password [facility] 09/17/2018, 3:38 PM

## 2018-09-17 NOTE — Progress Notes (Signed)
Critical lab value: Potassium 2.3, MD notified. Awaiting new orders.

## 2018-09-17 NOTE — Plan of Care (Signed)
  Problem: Acute Rehab PT Goals(only PT should resolve) Goal: Pt Will Go Supine/Side To Sit Outcome: Progressing Flowsheets (Taken 09/17/2018 1614) Pt will go Supine/Side to Sit: with minimal assist Goal: Patient Will Transfer Sit To/From Stand Outcome: Progressing Flowsheets (Taken 09/17/2018 1614) Patient will transfer sit to/from stand: with minimal assist Goal: Pt Will Transfer Bed To Chair/Chair To Bed Outcome: Progressing Flowsheets (Taken 09/17/2018 1614) Pt will Transfer Bed to Chair/Chair to Bed: with min assist Goal: Pt Will Ambulate Outcome: Progressing Flowsheets (Taken 09/17/2018 1614) Pt will Ambulate: 25 feet; with minimal assist; with rolling walker   4:14 PM, 09/17/18 Lonell Grandchild, MPT Physical Therapist with [facility] [phone] office [phone] mobile phone

## 2018-09-18 DIAGNOSIS — C3491 Malignant neoplasm of unspecified part of right bronchus or lung: Secondary | ICD-10-CM

## 2018-09-18 LAB — CBC
HEMATOCRIT: 27.4 % — AB (ref 36.0–46.0)
Hemoglobin: 9 g/dL — ABNORMAL LOW (ref 12.0–15.0)
MCH: 28.7 pg (ref 26.0–34.0)
MCHC: 32.8 g/dL (ref 30.0–36.0)
MCV: 87.3 fL (ref 78.0–100.0)
Platelets: 361 10*3/uL (ref 150–400)
RBC: 3.14 MIL/uL — AB (ref 3.87–5.11)
RDW: 15.5 % (ref 11.5–15.5)
WBC: 8.6 10*3/uL (ref 4.0–10.5)

## 2018-09-18 LAB — BASIC METABOLIC PANEL
Anion gap: 9 (ref 5–15)
CHLORIDE: 106 mmol/L (ref 98–111)
CO2: 22 mmol/L (ref 22–32)
Calcium: 7.3 mg/dL — ABNORMAL LOW (ref 8.9–10.3)
Creatinine, Ser: 0.32 mg/dL — ABNORMAL LOW (ref 0.44–1.00)
GFR calc non Af Amer: >60 mL/min (ref >60)
Glucose, Bld: 143 mg/dL — ABNORMAL HIGH (ref 70–99)
POTASSIUM: 3.5 mmol/L (ref 3.5–5.1)
Sodium: 137 mmol/L (ref 135–145)

## 2018-09-18 LAB — C DIFFICILE QUICK SCREEN W PCR REFLEX
C DIFFICILE (CDIFF) INTERP: NOT DETECTED
C Diff antigen: NEGATIVE
C Diff toxin: NEGATIVE

## 2018-09-18 MED ORDER — LOPERAMIDE HCL 2 MG PO CAPS
2.0000 mg | ORAL_CAPSULE | Freq: Once | ORAL | Status: AC
  Administered 2018-09-18: 2 mg via ORAL
  Filled 2018-09-18: qty 1

## 2018-09-18 MED ORDER — SODIUM CHLORIDE 0.9 % IV SOLN
INTRAVENOUS | Status: DC
  Administered 2018-09-18: 22:00:00 via INTRAVENOUS

## 2018-09-18 MED ORDER — IPRATROPIUM-ALBUTEROL 0.5-2.5 (3) MG/3ML IN SOLN
3.0000 mL | Freq: Three times a day (TID) | RESPIRATORY_TRACT | Status: DC
  Administered 2018-09-19 – 2018-09-23 (×11): 3 mL via RESPIRATORY_TRACT
  Filled 2018-09-18 (×13): qty 3

## 2018-09-18 MED ORDER — IPRATROPIUM-ALBUTEROL 0.5-2.5 (3) MG/3ML IN SOLN
3.0000 mL | Freq: Three times a day (TID) | RESPIRATORY_TRACT | Status: DC
  Administered 2018-09-18 (×3): 3 mL via RESPIRATORY_TRACT
  Filled 2018-09-18 (×3): qty 3

## 2018-09-18 NOTE — NC FL2 (Signed)
[place] LEVEL OF CARE SCREENING TOOL     IDENTIFICATION  Patient Name: Theresa Vincent Birthdate: 10-22-1951 Sex: female Admission Date (Current Location): 09/15/2018  [facility] and [place] Number:  [employer] and Address:  [place] [address], [place]      Provider Number: [id number]  Attending Physician Name and Address:  Alma Friendly, MD  Relative Name and Phone Number:  Bonita Quin (daughter) [phone]    Current Level of Care: Hospital Recommended Level of Care: [place] Prior Approval Number:    Date Approved/Denied:   PASRR Number: [id number]  Discharge Plan: SNF    Current Diagnoses: Patient Active Problem List   Diagnosis Date Noted  . HCAP (healthcare-associated pneumonia) 09/16/2018  . Sepsis ([place]) 09/15/2018  . Brain metastases ([address]) 08/01/2018  . Non-small cell lung cancer, right ([place]) 07/16/2018  . Screening for colorectal cancer 04/23/2018  . Goals of care, counseling/discussion 04/23/2018  . LLQ pain 04/08/2018  . Vaginal atrophy 04/08/2018  . BV (bacterial vaginosis) 04/08/2018  . Vaginal odor 04/08/2018  . Vaginal discharge 04/08/2018  . Hypothyroidism following radioiodine therapy 01/13/2016  . Prediabetes 01/13/2016  . Fracture of fourth toe, right, closed 06/08/2014  . Wrist fracture 05/02/2011  . FRACTURE, RADIUS, DISTAL 03/06/2011  . BLOOD IN STOOL 07/13/2009  . ARTHRITIS, BACK 07/13/2009    Orientation RESPIRATION BLADDER Height & Weight     Self, Time, Situation, Place  O2(see dc summary) Continent Weight: 143 lb 11.8 oz (65.2 kg) Height:  5\' 2"  (157.5 cm)  BEHAVIORAL SYMPTOMS/MOOD NEUROLOGICAL BOWEL NUTRITION STATUS      Continent Diet(see dc summary)  AMBULATORY STATUS COMMUNICATION OF NEEDS Skin   Extensive Assist Verbally Normal                       Personal Care Assistance Level of Assistance  Bathing, Feeding, Dressing Bathing  Assistance: Limited assistance Feeding assistance: Independent Dressing Assistance: Limited assistance     Functional Limitations Info  Sight, Hearing, Speech Sight Info: Adequate Hearing Info: Adequate Speech Info: Adequate    SPECIAL CARE FACTORS FREQUENCY  PT (By licensed PT)     PT Frequency: 5 times week              Contractures Contractures Info: Not present    Additional Factors Info  Code Status, Allergies Code Status Info: DNR Allergies Info: Avelox           Current Medications (09/18/2018):  This is the current hospital active medication list Current Facility-Administered Medications  Medication Dose Route Frequency Provider Last Rate Last Dose  . 0.9 %  sodium chloride infusion   Intravenous PRN Isaac Bliss, Rayford Halsted, MD 10 mL/hr at 09/18/18 0945    . acetaminophen (TYLENOL) tablet 650 mg  650 mg Oral Q6H PRN Isaac Bliss, Rayford Halsted, MD   650 mg at 09/18/18 6387  . ceFEPIme (MAXIPIME) 2 g in sodium chloride 0.9 % 100 mL IVPB  2 g Intravenous Q12H Isaac Bliss, Rayford Halsted, MD 200 mL/hr at 09/18/18 0800 2 g at 09/18/18 0800  . enoxaparin (LOVENOX) injection 40 mg  40 mg Subcutaneous Q24H Isaac Bliss, Rayford Halsted, MD   40 mg at 09/18/18 0800  . folic acid (FOLVITE) tablet 1 mg  1 mg Oral Daily Isaac Bliss, Rayford Halsted, MD   1 mg at 09/18/18 0800  . guaiFENesin-dextromethorphan (ROBITUSSIN DM) 100-10 MG/5ML syrup 5 mL  5 mL  Oral Q4H PRN Isaac Bliss, Rayford Halsted, MD   5 mL at 09/17/18 2121  . ipratropium-albuterol (DUONEB) 0.5-2.5 (3) MG/3ML nebulizer solution 3 mL  3 mL Nebulization Q8H Alma Friendly, MD   3 mL at 09/18/18 1021  . levothyroxine (SYNTHROID, LEVOTHROID) tablet 25 mcg  25 mcg Oral QAC breakfast Isaac Bliss, Rayford Halsted, MD   25 mcg at 09/18/18 0800  . ondansetron (ZOFRAN) tablet 4 mg  4 mg Oral Q6H PRN Isaac Bliss, Rayford Halsted, MD       Or  . ondansetron [facility] injection 4 mg  4 mg Intravenous Q6H PRN Isaac Bliss,  Rayford Halsted, MD   4 mg at 09/17/18 2301     Discharge Medications: Please see discharge summary for a list of discharge medications.  Relevant Imaging Results:  Relevant Lab Results:   Additional Information SSN: [place], LCSW

## 2018-09-18 NOTE — Progress Notes (Signed)
Pharmacy Antibiotic Note  Theresa Vincent is a 67 y.o. female  cancer patient admitted on 09/15/2018 with fever of unknow source .  Pharmacy has been consulted for cefepime  dosing. Patient currently afebrile, no leukocytosis, LA normalized. BCx ngtd. Chest Xray concerning for PNA. Patient improving  Plan: Continue cefepime 2g IV q12h  F/U cultures and patient progress F/U deescalation of abx Monitor V/S, labs  Height: 5\' 2"  (157.5 cm) Weight: 143 lb 11.8 oz (65.2 kg) IBW/kg (Calculated) : 50.1  Temp (24hrs), Avg:99.3 F (37.4 C), Min:98.5 F (36.9 C), Max:100.2 F (37.9 C)  Recent Labs  Lab 09/14/18 1107 09/15/18 1941 09/15/18 2002 09/16/18 0424 09/17/18 0521 09/18/18 0422  WBC 2.9* 5.5  --  5.9 7.5 8.6  CREATININE 0.45 0.44  --  0.35* 0.30* 0.32*  LATICACIDVEN  --   --  1.17  --   --   --     Estimated Creatinine Clearance: 62.1 mL/min (A) (by C-G formula based on SCr of 0.32 mg/dL (L)).    Allergies  Allergen Reactions  . Avelox [Moxifloxacin Hcl In Nacl] Itching    Antimicrobials this admission: metronidazole 9/22 >>9/22  cefepime 9/22 >>   vancomycin 9/22>>9/23 Merrem 1gm IV x 1 9/23 zyvox 600mg  IV x 1 9/23  Microbiology results: 9/22 BCx2: Ngtd 9/22 UCx: multiple species 9/21 UCx: NG (final) 9/23 MRSA PCR: neg  Thank you for allowing pharmacy to participate in  this patient's care.  Isac Sarna, BS Pharm D, BCPS Clinical Pharmacist Pager [phone] 09/18/2018 1:08 PM

## 2018-09-18 NOTE — Progress Notes (Signed)
PROGRESS NOTE    Theresa Vincent NEEDS  GUR:[id number] DOB: 13-Jun-1951 DOA: 09/15/2018 PCP: Sharilyn Sites, MD     Brief Narrative:  HPI from Dr Jerilee Hoh Patient is a 67 year old woman admitted from home on 9/22 due to weakness, dizziness and nausea.  She has a history of lung cancer metastatic to brain who has completed her radiation treatment and just a week ago had her first chemotherapy treatment.  She states she was discharged on Zofran and Phenergan and those have not helped her nausea.  She notes that she has been unable to keep up with her hydration status and feels very dehydrated.  She met sepsis criteria on admission and was admitted for further evaluation and management.   Today, pt reported some SOB, denies any chest pain, abdominal pain, cough, N/V, fever/chills. Reports diarrhea has resolved.   Assessment & Plan:   Active Problems:   Hypothyroidism following radioiodine therapy   Non-small cell lung cancer, right ([place])   Brain metastases ([place])   Sepsis ([place])   HCAP (healthcare-associated pneumonia)  Sepsis 2/2 healthcare associated pneumonia Currently afebrile, with no leukocytosis, with some SOB/fatigue LA 1.17 BC X 2 NGTD CXR showed interstitial and airspace opacities in the left upper lobe concerning for pneumonia   Continue cefepime Stop IVF, start neb treatment Monitor closely  Hypothyroidism  Continue Synthroid  Normocytic anemia Baseline around 12 Likely hemodilution as pt was receiving 125cc/hr of IVF for about 3 days No signs of bleeding Daily CBC  Lung cancer metastatic to brain Outpatient follow-up with oncology as scheduled CT scan of the brain was ordered  which shows decrease in size of both brain metastases and vasogenic edema  Electrolyte disturbances Hypokalemia, hypomagnesemia Replace prn   DVT prophylaxis: Lovenox Code Status: DNR Family Communication: None at bedside Disposition Plan: SNF in the next day or 2  Consultants:    None  Procedures:   None  Antimicrobials:  Anti-infectives (From admission, onward)   Start     Dose/Rate Route Frequency Ordered Stop   09/16/18 1800  vancomycin (VANCOCIN) IVPB 1000 mg/200 mL premix  Status:  Discontinued     1,000 mg 200 mL/hr over 60 Minutes Intravenous Every 24 hours 09/15/18 1951 09/16/18 0941   09/16/18 1800  vancomycin (VANCOCIN) IVPB 1000 mg/200 mL premix  Status:  Discontinued     1,000 mg 200 mL/hr over 60 Minutes Intravenous Every 24 hours 09/16/18 0942 09/16/18 1110   09/16/18 0800  ceFEPIme (MAXIPIME) 2 g in sodium chloride 0.9 % 100 mL IVPB     2 g 200 mL/hr over 30 Minutes Intravenous Every 12 hours 09/15/18 1951     09/16/18 0800  meropenem (MERREM) 1 g in sodium chloride 0.9 % 100 mL IVPB     1 g 200 mL/hr over 30 Minutes Intravenous  Once 09/15/18 2233 09/16/18 0942   09/15/18 2245  linezolid (ZYVOX) IVPB 600 mg     600 mg 300 mL/hr over 60 Minutes Intravenous  Once 09/15/18 2235 09/16/18 0141   09/15/18 2000  vancomycin (VANCOCIN) 1,500 mg in sodium chloride 0.9 % 500 mL IVPB     1,500 mg 250 mL/hr over 120 Minutes Intravenous  Once 09/15/18 1951 09/15/18 2136   09/15/18 1915  ceFEPIme (MAXIPIME) 2 g in sodium chloride 0.9 % 100 mL IVPB     2 g 200 mL/hr over 30 Minutes Intravenous  Once 09/15/18 1901 09/15/18 2040   09/15/18 1915  metroNIDAZOLE (FLAGYL) IVPB 500 mg  Status:  Discontinued  500 mg 100 mL/hr over 60 Minutes Intravenous Every 8 hours 09/15/18 1901 09/15/18 2201   09/15/18 1915  vancomycin (VANCOCIN) IVPB 1000 mg/200 mL premix  Status:  Discontinued     1,000 mg 200 mL/hr over 60 Minutes Intravenous  Once 09/15/18 1901 09/15/18 1949       Subjective: Today, pt reported some SOB, denies any chest pain, abdominal pain, cough, N/V, fever/chills. Reports diarrhea has resolved.   Objective: Vitals:   09/17/18 2025 09/17/18 2255 09/18/18 0548 09/18/18 0800  BP: 111/74  117/90   Pulse: (!) 111  (!) 113   Resp: 16  18    Temp: 99.2 F (37.3 C)  100.2 F (37.9 C) 98.5 F (36.9 C)  TempSrc: Oral  Oral Oral  SpO2: 97% 98% 95%   Weight:      Height:        Intake/Output Summary (Last 24 hours) at 09/18/2018 0927 Last data filed at 09/17/2018 1800 Gross per 24 hour  Intake 1755.64 ml  Output 400 ml  Net 1355.64 ml   Filed Weights   09/15/18 1830 09/15/18 2207 09/16/18 0600  Weight: 65 kg 65.2 kg 65.2 kg    Examination:  General exam: Alert, awake, oriented x 3 Respiratory system: Diminished BS b/l Cardiovascular system: S1, S2 present Gastrointestinal system: Soft, NT, ND, BS present Extremities: No bilateral pedal edema, +pedal pulses Skin: No rashes, lesions or ulcers Psychiatry: Normal mood   Data Reviewed: I have personally reviewed following labs and imaging studies  CBC: Recent Labs  Lab 09/14/18 1107 09/15/18 1941 09/16/18 0424 09/17/18 0521 09/18/18 0422  WBC 2.9* 5.5 5.9 7.5 8.6  NEUTROABS 1.8 4.5  --   --   --   HGB 12.0 10.8* 9.9* 9.2* 9.0*  HCT 35.6* 32.0* 30.2* 27.3* 27.4*  MCV 87.7 86.7 87.5 87.2 87.3  PLT 186 230 243 275 948   Basic Metabolic Panel: Recent Labs  Lab 09/14/18 1107 09/15/18 1941 09/16/18 0424 09/17/18 0521 09/17/18 0959 09/18/18 0422  NA 131* 132* 138 137  --  137  K 4.8 3.5 3.1* 2.3*  --  3.5  CL 95* 97* 106 104  --  106  CO2 '24 25 23 23  '$ --  22  GLUCOSE 142* 146* 122* 106*  --  143*  BUN 9 7* 6* 5*  --  <5*  CREATININE 0.45 0.44 0.35* 0.30*  --  0.32*  CALCIUM 8.4* 8.1* 7.7* 7.4*  --  7.3*  MG  --   --   --   --  1.6*  --    GFR: Estimated Creatinine Clearance: 62.1 mL/min (A) (by C-G formula based on SCr of 0.32 mg/dL (L)). Liver Function Tests: Recent Labs  Lab 09/14/18 1107 09/15/18 1941 09/16/18 0424  AST 35 24 20  ALT '20 18 16  '$ ALKPHOS 80 75 68  BILITOT 1.6* 0.7 0.7  PROT 6.4* 6.0* 5.4*  ALBUMIN 3.0* 2.6* 2.3*   Recent Labs  Lab 09/15/18 1941  LIPASE 18   No results for input(s): AMMONIA in the last 168  hours. Coagulation Profile: No results for input(s): INR, PROTIME in the last 168 hours. Cardiac Enzymes: Recent Labs  Lab 09/15/18 1941  TROPONINI <0.03   BNP (last 3 results) No results for input(s): PROBNP in the last 8760 hours. HbA1C: No results for input(s): HGBA1C in the last 72 hours. CBG: No results for input(s): GLUCAP in the last 168 hours. Lipid Profile: No results for input(s): CHOL, HDL, LDLCALC, TRIG,  CHOLHDL, LDLDIRECT in the last 72 hours. Thyroid Function Tests: No results for input(s): TSH, T4TOTAL, FREET4, T3FREE, THYROIDAB in the last 72 hours. Anemia Panel: No results for input(s): VITAMINB12, FOLATE, FERRITIN, TIBC, IRON, RETICCTPCT in the last 72 hours. Urine analysis:    Component Value Date/Time   COLORURINE YELLOW 09/15/2018 2229   APPEARANCEUR CLEAR 09/15/2018 2229   LABSPEC 1.005 09/15/2018 2229   PHURINE 7.0 09/15/2018 2229   GLUCOSEU NEGATIVE 09/15/2018 2229   HGBUR NEGATIVE 09/15/2018 2229   BILIRUBINUR NEGATIVE 09/15/2018 2229   KETONESUR NEGATIVE 09/15/2018 2229   PROTEINUR NEGATIVE 09/15/2018 2229   NITRITE NEGATIVE 09/15/2018 2229   LEUKOCYTESUR NEGATIVE 09/15/2018 2229   Sepsis Labs: '@LABRCNTIP'$ (procalcitonin:4,lacticidven:4)  ) Recent Results (from the past 240 hour(s))  Urine Culture     Status: None   Collection Time: 09/14/18 11:07 AM  Result Value Ref Range Status   Specimen Description   Final    URINE, CLEAN CATCH Performed at [facility], [address], [place], [place] [address]    Special Requests   Final    NONE Performed at [facility], [address], [place], [place] [address]    Culture   Final    NO GROWTH Performed at [facility], [place] [address] [place], [place] [address]    Report Status 09/15/2018 FINAL  Final  Blood Culture (routine x 2)     Status: None (Preliminary result)   Collection Time: 09/15/18  7:41 PM  Result Value Ref Range Status   Specimen Description PORTA CATH DRAWN BY RN  Final    Special Requests   Final    BOTTLES DRAWN AEROBIC AND ANAEROBIC Blood Culture adequate volume   Culture   Final    NO GROWTH 3 DAYS Performed at [facility], [address] [place], [place] [address]    Report Status PENDING  Incomplete  Blood Culture (routine x 2)     Status: None (Preliminary result)   Collection Time: 09/15/18  7:55 PM  Result Value Ref Range Status   Specimen Description BLOOD LEFT HAND  Final   Special Requests   Final    BOTTLES DRAWN AEROBIC AND ANAEROBIC Blood Culture adequate volume   Culture   Final    NO GROWTH 3 DAYS Performed at [facility], [address] [place], [place] [address]    Report Status PENDING  Incomplete  MRSA PCR Screening     Status: None   Collection Time: 09/15/18 10:02 PM  Result Value Ref Range Status   MRSA by PCR NEGATIVE NEGATIVE Final    Comment:        The GeneXpert MRSA Assay (FDA approved for NASAL specimens only), is one component of a comprehensive MRSA colonization surveillance program. It is not intended to diagnose MRSA infection nor to guide or monitor treatment for MRSA infections. Performed at [facility], [address], [place], [place] [address]   Urine culture     Status: None   Collection Time: 09/15/18 10:29 PM  Result Value Ref Range Status   Specimen Description   Final    URINE, CLEAN CATCH Performed at [facility], [address] [place], [place] [address]    Special Requests   Final    NONE Performed at [facility], [address] [place], [place] [address]    Culture   Final    Multiple bacterial morphotypes present, none predominant. Suggest appropriate recollection if clinically indicated.   Report Status 09/17/2018 FINAL  Final  C difficile quick scan w PCR reflex     Status: None   Collection Time: 09/18/18  1:28 AM  Result Value Ref Range Status   C Diff antigen NEGATIVE NEGATIVE Final   C Diff toxin NEGATIVE NEGATIVE Final   C Diff interpretation No C. difficile detected.   Final    Comment: Performed at [facility], [address] [place], [place] [address]         Radiology Studies: No results found.      Scheduled Meds: . enoxaparin (LOVENOX) injection  40 mg Subcutaneous Q24H  . folic acid  1 mg Oral Daily  . ipratropium-albuterol  3 mL Nebulization Q8H  . levothyroxine  25 mcg Oral QAC breakfast   Continuous Infusions: . sodium chloride Stopped (09/15/18 2106)  . ceFEPime (MAXIPIME) IV 2 g (09/18/18 0800)     LOS: 3 days    Time spent: 35 minutes. Greater than 50% of this time was spent in direct contact with the patient and with patient's daughter, coordinating care and discussing relevant ongoing clinical issues, including improvement in sepsis parameters, plan to continue current antibiotic and replace electrolytes as well as advance diet, we have also discussed possible discharge home in a.m. pending clinical improvement and PT assessment recommendations.    Alma Friendly, MD [facility]  If 7PM-7AM, please contact night-coverage [email or web address] 09/18/2018, 9:27 AM

## 2018-09-18 NOTE — Clinical Social Work Note (Signed)
Clinical Social Work Assessment  Patient Details  Name: Theresa Vincent MRN: [id number] Date of Birth: 1951/09/10  Date of referral:  09/18/18               Reason for consult:  Facility Placement                Permission sought to share information with:    Permission granted to share information::     Name::        Agency::     Relationship::     Contact Information:     Housing/Transportation Living arrangements for the past 2 months:  Single Family Home Source of Information:  Patient Patient Interpreter Needed:  None Criminal Activity/Legal Involvement Pertinent to Current Situation/Hospitalization:  No - Comment as needed Significant Relationships:  Adult Children Lives with:  Adult Children Do you feel safe going back to the place where you live?  Yes Need for family participation in patient care:  Yes (Comment)  Care giving concerns:  None identified at baseline.   Social Worker assessment / plan:  Patient's adult daughter lives with her. At baseline, patient ambulates with a walker. She is independent in her ADLS.  She is agreeable to SNF for rehab.   Employment status:  Retired Forensic scientist:  Medicare PT Recommendations:  [place] / Referral to community resources:  [place]  Patient/Family's Response to care:  Patient is agreeable to SNF for short term rehab.e   Patient/Family's Understanding of and Emotional Response to Diagnosis, Current Treatment, and Prognosis:  Patient understands her diagnosis, treatment and prognosis.   Emotional Assessment Appearance:  Appears older than stated age Attitude/Demeanor/Rapport:    Affect (typically observed):  Accepting, Calm Orientation:  Oriented to Self, Oriented to Place, Oriented to  Time, Oriented to Situation Alcohol / Substance use:  Not Applicable Psych involvement (Current and /or in the community):     Discharge Needs  Concerns to be addressed:  Discharge  Planning Concerns Readmission within the last 30 days:  No Current discharge risk:  None Barriers to Discharge:  No Barriers Identified   Ihor Gully, LCSW 09/18/2018, 11:01 AM

## 2018-09-18 NOTE — Progress Notes (Signed)
Physical Therapy Treatment Patient Details Name: Theresa Vincent MRN: [id number] DOB: [birth year] Today's Date: 09/18/2018    History of Present Illness Theresa Vincent  is a 67 y.o. female, with history of non-small cell lung cancer, poorly differentiated with extensive brain metastasis underwent 10 rounds of radiation followed by first round of chemotherapy this month.  Patient has been having dizziness with nausea and vomiting for past 1 month which has been persistent.  Nothing seems to make it better.  Today in the ED again patient came with dizziness with nausea and vomiting.    PT Comments    Patient demonstrates improvement for using BUE for supine to sitting and scooting self to bedside with less assistance, able to complete BLE ROM exercises seated at bedside with occasional repeated verbal cueing, limited for taking steps due to fatigue and BLE weakness and tolerated sitting up in chair after therapy with her daughter in room.  Patient will benefit from continued physical therapy in hospital and recommended venue below to increase strength, balance, endurance for safe ADLs and gait.    Follow Up Recommendations  SNF     Equipment Recommendations  None recommended by PT    Recommendations for Other Services       Precautions / Restrictions Precautions Precautions: Fall Restrictions Weight Bearing Restrictions: No    Mobility  Bed Mobility Overal bed mobility: Needs Assistance Bed Mobility: Supine to Sit     Supine to sit: Min assist     General bed mobility comments: slow labored movement  Transfers Overall transfer level: Needs assistance Equipment used: Rolling walker (2 wheeled) Transfers: Sit to/from [facility] Sit to Stand: Mod assist Stand pivot transfers: Mod assist          Ambulation/Gait Ambulation/Gait assistance: Mod assist Gait Distance (Feet): 3 Feet Assistive device: Rolling walker (2 wheeled) Gait Pattern/deviations:  Decreased step length - right;Decreased step length - left;Decreased stride length Gait velocity: slow   General Gait Details: limited to 5-6 unsteady slow labored side steps during transfer to chair secondary to BLE weakness, fatigue   Stairs             Wheelchair Mobility    Modified Rankin (Stroke Patients Only)       Balance Overall balance assessment: Needs assistance Sitting-balance support: Feet supported;No upper extremity supported Sitting balance-Leahy Scale: Fair     Standing balance support: Bilateral upper extremity supported;During functional activity Standing balance-Leahy Scale: Fair Standing balance comment: using RW                            Cognition Arousal/Alertness: Awake/alert Behavior During Therapy: WFL for tasks assessed/performed Overall Cognitive Status: Within Functional Limits for tasks assessed                                        Exercises General Exercises - Lower Extremity Long Arc Quad: Seated;AROM;Strengthening;Both;10 reps Hip Flexion/Marching: Seated;AROM;Strengthening;Both;10 reps Toe Raises: Seated;Strengthening;AROM;Both;10 reps Heel Raises: Seated;AROM;Strengthening;Both;10 reps    General Comments        Pertinent Vitals/Pain Pain Assessment: No/denies pain    Home Living                      Prior Function            PT Goals (current goals can now be found in  the care plan section) Acute Rehab PT Goals Patient Stated Goal: return home with family to assist after rehab PT Goal Formulation: With patient Time For Goal Achievement: 10/01/18 Potential to Achieve Goals: Good Progress towards PT goals: Progressing toward goals    Frequency    Min 3X/week      PT Plan      Co-evaluation              AM-PAC PT "6 Clicks" Daily Activity  Outcome Measure  Difficulty turning over in bed (including adjusting bedclothes, sheets and blankets)?: A  Little Difficulty moving from lying on back to sitting on the side of the bed? : A Little Difficulty sitting down on and standing up from a chair with arms (e.g., wheelchair, bedside commode, etc,.)?: A Lot Help needed moving to and from a bed to chair (including a wheelchair)?: A Lot Help needed walking in hospital room?: A Lot Help needed climbing 3-5 steps with a railing? : A Lot 6 Click Score: 14    End of Session Equipment Utilized During Treatment: Oxygen Activity Tolerance: Patient tolerated treatment well;Patient limited by fatigue Patient left: in chair;with call bell/phone within reach;with family/visitor present Nurse Communication: Mobility status;Other (comment)(nursing staff aware patient left up in chair) PT Visit Diagnosis: Unsteadiness on feet (R26.81);Other abnormalities of gait and mobility (R26.89);Muscle weakness (generalized) (M62.81)     Time: 4356-8616 PT Time Calculation (min) (ACUTE ONLY): 28 min  Charges:  $Therapeutic Exercise: 8-22 mins $Therapeutic Activity: 8-22 mins                     4:03 PM, 09/18/18 Theresa Vincent, MPT Physical Therapist with [facility] 336 [phone] office [phone] mobile phone

## 2018-09-19 LAB — RESPIRATORY PANEL BY PCR
Adenovirus: NOT DETECTED
Bordetella pertussis: NOT DETECTED
CHLAMYDOPHILA PNEUMONIAE-RVPPCR: NOT DETECTED
CORONAVIRUS 229E-RVPPCR: NOT DETECTED
CORONAVIRUS HKU1-RVPPCR: NOT DETECTED
Coronavirus NL63: NOT DETECTED
Coronavirus OC43: NOT DETECTED
Influenza A: NOT DETECTED
Influenza B: NOT DETECTED
METAPNEUMOVIRUS-RVPPCR: NOT DETECTED
Mycoplasma pneumoniae: NOT DETECTED
PARAINFLUENZA VIRUS 2-RVPPCR: NOT DETECTED
PARAINFLUENZA VIRUS 3-RVPPCR: NOT DETECTED
Parainfluenza Virus 1: NOT DETECTED
Parainfluenza Virus 4: NOT DETECTED
RHINOVIRUS / ENTEROVIRUS - RVPPCR: NOT DETECTED
Respiratory Syncytial Virus: NOT DETECTED

## 2018-09-19 LAB — BASIC METABOLIC PANEL
Anion gap: 8 (ref 5–15)
BUN: 7 mg/dL — ABNORMAL LOW (ref 8–23)
CHLORIDE: 106 mmol/L (ref 98–111)
CO2: 24 mmol/L (ref 22–32)
Calcium: 7.6 mg/dL — ABNORMAL LOW (ref 8.9–10.3)
Creatinine, Ser: 0.33 mg/dL — ABNORMAL LOW (ref 0.44–1.00)
GFR calc non Af Amer: >60 mL/min (ref >60)
Glucose, Bld: 110 mg/dL — ABNORMAL HIGH (ref 70–99)
POTASSIUM: 3.5 mmol/L (ref 3.5–5.1)
SODIUM: 138 mmol/L (ref 135–145)

## 2018-09-19 LAB — CBC WITH DIFFERENTIAL/PLATELET
BASOS ABS: 0 10*3/uL (ref 0.0–0.1)
Basophils Relative: 0 %
Eosinophils Absolute: 0.1 10*3/uL (ref 0.0–0.7)
Eosinophils Relative: 1 %
HCT: 26.8 % — ABNORMAL LOW (ref 36.0–46.0)
HEMOGLOBIN: 8.9 g/dL — AB (ref 12.0–15.0)
LYMPHS ABS: 0.3 10*3/uL — AB (ref 0.7–4.0)
Lymphocytes Relative: 3 %
MCH: 29 pg (ref 26.0–34.0)
MCHC: 33.2 g/dL (ref 30.0–36.0)
MCV: 87.3 fL (ref 78.0–100.0)
MONO ABS: 0.2 10*3/uL (ref 0.1–1.0)
Monocytes Relative: 2 %
NEUTROS PCT: 94 %
Neutro Abs: 7.7 10*3/uL (ref 1.7–7.7)
Platelets: 350 10*3/uL (ref 150–400)
RBC: 3.07 MIL/uL — AB (ref 3.87–5.11)
RDW: 15.9 % — ABNORMAL HIGH (ref 11.5–15.5)
WBC: 8.3 10*3/uL (ref 4.0–10.5)

## 2018-09-19 LAB — MAGNESIUM: MAGNESIUM: 1.9 mg/dL (ref 1.7–2.4)

## 2018-09-19 MED ORDER — METHYLPREDNISOLONE SODIUM SUCC 125 MG IJ SOLR
60.0000 mg | Freq: Two times a day (BID) | INTRAMUSCULAR | Status: DC
  Administered 2018-09-19 – 2018-09-23 (×8): 60 mg via INTRAVENOUS
  Filled 2018-09-19 (×8): qty 2

## 2018-09-19 MED ORDER — METHYLPREDNISOLONE SODIUM SUCC 125 MG IJ SOLR
125.0000 mg | Freq: Once | INTRAMUSCULAR | Status: AC
  Administered 2018-09-19: 125 mg via INTRAVENOUS
  Filled 2018-09-19: qty 2

## 2018-09-19 NOTE — Progress Notes (Signed)
Physical Therapy Treatment Patient Details Name: Theresa Vincent MRN: [id number] DOB: 1951/02/19 Today's Date: 09/19/2018    History of Present Illness Theresa Vincent  is a 67 y.o. female, with history of non-small cell lung cancer, poorly differentiated with extensive brain metastasis underwent 10 rounds of radiation followed by first round of chemotherapy this month.  Patient has been having dizziness with nausea and vomiting for past 1 month which has been persistent.  Nothing seems to make it better.  Today in the ED again patient came with dizziness with nausea and vomiting.    PT Comments    Patient demonstrates slightly increased tolerance for taking steps with RW and stand while on 2 LPM O2, able to do mostly side steps, but unsafe to take steps away from bedside due to BLE weakness and fall risk.  Patient tolerated sitting up in chair after therapy - RN notified.  Patient will benefit from continued physical therapy in hospital and recommended venue below to increase strength, balance, endurance for safe ADLs and gait.    Follow Up Recommendations  SNF     Equipment Recommendations  None recommended by PT    Recommendations for Other Services       Precautions / Restrictions Precautions Precautions: Fall Restrictions Weight Bearing Restrictions: No    Mobility  Bed Mobility Overal bed mobility: Needs Assistance Bed Mobility: Supine to Sit     Supine to sit: Min assist     General bed mobility comments: slow labored movement  Transfers Overall transfer level: Needs assistance Equipment used: Rolling walker (2 wheeled) Transfers: Sit to/from [facility] Sit to Stand: Mod assist Stand pivot transfers: Mod assist       General transfer comment: slow labored movement  Ambulation/Gait Ambulation/Gait assistance: Mod assist Gait Distance (Feet): 5 Feet Assistive device: Rolling walker (2 wheeled) Gait Pattern/deviations: Decreased step length -  right;Decreased step length - left;Decreased stride length Gait velocity: slow   General Gait Details: limited to 7-8 slow unsteady steps at bedside due to fatigue and BLE weakness   Stairs             Wheelchair Mobility    Modified Rankin (Stroke Patients Only)       Balance Overall balance assessment: Needs assistance Sitting-balance support: Feet supported;No upper extremity supported Sitting balance-Leahy Scale: Good     Standing balance support: Bilateral upper extremity supported;During functional activity Standing balance-Leahy Scale: Fair                              Cognition Arousal/Alertness: Awake/alert Behavior During Therapy: WFL for tasks assessed/performed Overall Cognitive Status: Within Functional Limits for tasks assessed                                        Exercises General Exercises - Lower Extremity Long Arc Quad: Seated;AROM;Strengthening;Both;10 reps Hip Flexion/Marching: Seated;AROM;Strengthening;Both;10 reps Toe Raises: Seated;Strengthening;AROM;Both;10 reps Heel Raises: Seated;AROM;Strengthening;Both;10 reps    General Comments        Pertinent Vitals/Pain Pain Assessment: No/denies pain    Home Living                      Prior Function            PT Goals (current goals can now be found in the care plan section) Acute Rehab PT  Goals Patient Stated Goal: return home with family to assist after rehab PT Goal Formulation: With patient Time For Goal Achievement: 10/01/18 Potential to Achieve Goals: Good Progress towards PT goals: Progressing toward goals    Frequency    Min 3X/week      PT Plan Current plan remains appropriate    Co-evaluation              AM-PAC PT "6 Clicks" Daily Activity  Outcome Measure  Difficulty turning over in bed (including adjusting bedclothes, sheets and blankets)?: None Difficulty moving from lying on back to sitting on the side of the  bed? : A Little Difficulty sitting down on and standing up from a chair with arms (e.g., wheelchair, bedside commode, etc,.)?: A Lot Help needed moving to and from a bed to chair (including a wheelchair)?: A Lot Help needed walking in hospital room?: A Lot Help needed climbing 3-5 steps with a railing? : A Lot 6 Click Score: 15    End of Session Equipment Utilized During Treatment: Oxygen Activity Tolerance: Patient tolerated treatment well;Patient limited by fatigue Patient left: in chair;with call bell/phone within reach Nurse Communication: Mobility status;Other (comment)(RN notified that patient left up in chair) PT Visit Diagnosis: Unsteadiness on feet (R26.81);Other abnormalities of gait and mobility (R26.89);Muscle weakness (generalized) (M62.81)     Time: 2162-4469 PT Time Calculation (min) (ACUTE ONLY): 25 min  Charges:  $Therapeutic Exercise: 8-22 mins $Therapeutic Activity: 8-22 mins                     4:00 PM, 09/19/18 Lonell Grandchild, MPT Physical Therapist with [facility] [phone] office [phone] mobile phone

## 2018-09-19 NOTE — Progress Notes (Addendum)
PROGRESS NOTE    Theresa Vincent  SAY:[id number] DOB: June 02, 1951 DOA: 09/15/2018 PCP: Sharilyn Sites, MD     Brief Narrative:  HPI from Dr Jerilee Hoh Patient is a 67 year old woman admitted from home on 9/22 due to weakness, dizziness and nausea.  She has a history of lung cancer metastatic to brain who has completed her radiation treatment and just a week ago had her first chemotherapy treatment.  She states she was discharged on Zofran and Phenergan and those have not helped her nausea.  She notes that she has been unable to keep up with her hydration status and feels very dehydrated.  She met sepsis criteria on admission and was admitted for further evaluation and management.   Today, pt noted to be SOB, with significant wheezing noted. Noted to still be spiking temp. Denies any chest pain, abdominal pain, cough, N/V, no diarrhea   Assessment & Plan:   Active Problems:   Hypothyroidism following radioiodine therapy   Non-small cell lung cancer, right ([place])   Brain metastases ([place])   Sepsis ([place])   HCAP (healthcare-associated pneumonia)  Sepsis 2/2 healthcare associated pneumonia Currently febrile, with no leukocytosis, wheezing LA 1.17 BC X 2 NGTD Respiratory viral panel negative CXR showed interstitial and airspace opacities in the left upper lobe concerning for pneumonia   Continue cefepime Stop IVF, start neb treatment, solumedrol Home O2 eval Monitor closely  Hypothyroidism  Continue Synthroid  Normocytic anemia Baseline around 12 Likely hemodilution as pt was receiving 125cc/hr of IVF for about 3 days No signs of bleeding Daily CBC  Lung cancer metastatic to brain Outpatient follow-up with oncology as scheduled CT scan of the brain was ordered  which shows decrease in size of both brain metastases and vasogenic edema  Electrolyte disturbances Hypokalemia, hypomagnesemia Replace prn   DVT prophylaxis: Lovenox Code Status: DNR Family Communication: None at  bedside Disposition Plan: SNF in the next day or 2  Consultants:   None  Procedures:   None  Antimicrobials:  Anti-infectives (From admission, onward)   Start     Dose/Rate Route Frequency Ordered Stop   09/16/18 1800  vancomycin (VANCOCIN) IVPB 1000 mg/200 mL premix  Status:  Discontinued     1,000 mg 200 mL/hr over 60 Minutes Intravenous Every 24 hours 09/15/18 1951 09/16/18 0941   09/16/18 1800  vancomycin (VANCOCIN) IVPB 1000 mg/200 mL premix  Status:  Discontinued     1,000 mg 200 mL/hr over 60 Minutes Intravenous Every 24 hours 09/16/18 0942 09/16/18 1110   09/16/18 0800  ceFEPIme (MAXIPIME) 2 g in sodium chloride 0.9 % 100 mL IVPB     2 g 200 mL/hr over 30 Minutes Intravenous Every 12 hours 09/15/18 1951     09/16/18 0800  meropenem (MERREM) 1 g in sodium chloride 0.9 % 100 mL IVPB     1 g 200 mL/hr over 30 Minutes Intravenous  Once 09/15/18 2233 09/16/18 0942   09/15/18 2245  linezolid (ZYVOX) IVPB 600 mg     600 mg 300 mL/hr over 60 Minutes Intravenous  Once 09/15/18 2235 09/16/18 0141   09/15/18 2000  vancomycin (VANCOCIN) 1,500 mg in sodium chloride 0.9 % 500 mL IVPB     1,500 mg 250 mL/hr over 120 Minutes Intravenous  Once 09/15/18 1951 09/15/18 2136   09/15/18 1915  ceFEPIme (MAXIPIME) 2 g in sodium chloride 0.9 % 100 mL IVPB     2 g 200 mL/hr over 30 Minutes Intravenous  Once 09/15/18 1901 09/15/18 2040  09/15/18 1915  metroNIDAZOLE (FLAGYL) IVPB 500 mg  Status:  Discontinued     500 mg 100 mL/hr over 60 Minutes Intravenous Every 8 hours 09/15/18 1901 09/15/18 2201   09/15/18 1915  vancomycin (VANCOCIN) IVPB 1000 mg/200 mL premix  Status:  Discontinued     1,000 mg 200 mL/hr over 60 Minutes Intravenous  Once 09/15/18 1901 09/15/18 1949       Subjective: Today, pt noted to be SOB, with significant wheezing noted. Noted to still be spiking temp. Denies any chest pain, abdominal pain, cough, N/V, no diarrhea.   Objective: Vitals:   09/18/18 1609 09/18/18  2103 09/18/18 2133 09/19/18 0633  BP:   113/74 133/73  Pulse: (!) 118  (!) 126 (!) 113  Resp:   15 19  Temp:   100.3 F (37.9 C) (!) 100.9 F (38.3 C)  TempSrc:   Oral Oral  SpO2: 95% 93% 93% 94%  Weight:      Height:        Intake/Output Summary (Last 24 hours) at 09/19/2018 1607 Last data filed at 09/19/2018 1500 Gross per 24 hour  Intake 1869.11 ml  Output 900 ml  Net 969.11 ml   Filed Weights   09/15/18 1830 09/15/18 2207 09/16/18 0600  Weight: 65 kg 65.2 kg 65.2 kg    Examination:  General exam: Alert, awake, oriented x 3 Respiratory system: Diminished BS b/l, with bilateral exp wheezing noted  Cardiovascular system: S1, S2 present Gastrointestinal system: Soft, NT, ND, BS present Extremities: No bilateral pedal edema, +pedal pulses Skin: No rashes, lesions or ulcers Psychiatry: Normal mood   Data Reviewed: I have personally reviewed following labs and imaging studies  CBC: Recent Labs  Lab 09/14/18 1107 09/15/18 1941 09/16/18 0424 09/17/18 0521 09/18/18 0422 09/19/18 0525  WBC 2.9* 5.5 5.9 7.5 8.6 8.3  NEUTROABS 1.8 4.5  --   --   --  7.7  HGB 12.0 10.8* 9.9* 9.2* 9.0* 8.9*  HCT 35.6* 32.0* 30.2* 27.3* 27.4* 26.8*  MCV 87.7 86.7 87.5 87.2 87.3 87.3  PLT 186 230 243 275 361 528   Basic Metabolic Panel: Recent Labs  Lab 09/15/18 1941 09/16/18 0424 09/17/18 0521 09/17/18 0959 09/18/18 0422 09/19/18 0525  NA 132* 138 137  --  137 138  K 3.5 3.1* 2.3*  --  3.5 3.5  CL 97* 106 104  --  106 106  CO2 '25 23 23  '$ --  22 24  GLUCOSE 146* 122* 106*  --  143* 110*  BUN 7* 6* 5*  --  <5* 7*  CREATININE 0.44 0.35* 0.30*  --  0.32* 0.33*  CALCIUM 8.1* 7.7* 7.4*  --  7.3* 7.6*  MG  --   --   --  1.6*  --  1.9   GFR: Estimated Creatinine Clearance: 62.1 mL/min (A) (by C-G formula based on SCr of 0.33 mg/dL (L)). Liver Function Tests: Recent Labs  Lab 09/14/18 1107 09/15/18 1941 09/16/18 0424  AST 35 24 20  ALT '20 18 16  '$ ALKPHOS 80 75 68  BILITOT  1.6* 0.7 0.7  PROT 6.4* 6.0* 5.4*  ALBUMIN 3.0* 2.6* 2.3*   Recent Labs  Lab 09/15/18 1941  LIPASE 18   No results for input(s): AMMONIA in the last 168 hours. Coagulation Profile: No results for input(s): INR, PROTIME in the last 168 hours. Cardiac Enzymes: Recent Labs  Lab 09/15/18 1941  TROPONINI <0.03   BNP (last 3 results) No results for input(s): PROBNP in the  last 8760 hours. HbA1C: No results for input(s): HGBA1C in the last 72 hours. CBG: No results for input(s): GLUCAP in the last 168 hours. Lipid Profile: No results for input(s): CHOL, HDL, LDLCALC, TRIG, CHOLHDL, LDLDIRECT in the last 72 hours. Thyroid Function Tests: No results for input(s): TSH, T4TOTAL, FREET4, T3FREE, THYROIDAB in the last 72 hours. Anemia Panel: No results for input(s): VITAMINB12, FOLATE, FERRITIN, TIBC, IRON, RETICCTPCT in the last 72 hours. Urine analysis:    Component Value Date/Time   COLORURINE YELLOW 09/15/2018 2229   APPEARANCEUR CLEAR 09/15/2018 2229   LABSPEC 1.005 09/15/2018 2229   PHURINE 7.0 09/15/2018 2229   GLUCOSEU NEGATIVE 09/15/2018 2229   HGBUR NEGATIVE 09/15/2018 2229   BILIRUBINUR NEGATIVE 09/15/2018 2229   KETONESUR NEGATIVE 09/15/2018 2229   PROTEINUR NEGATIVE 09/15/2018 2229   NITRITE NEGATIVE 09/15/2018 2229   LEUKOCYTESUR NEGATIVE 09/15/2018 2229   Sepsis Labs: '@LABRCNTIP'$ (procalcitonin:4,lacticidven:4)  ) Recent Results (from the past 240 hour(s))  Urine Culture     Status: None   Collection Time: 09/14/18 11:07 AM  Result Value Ref Range Status   Specimen Description   Final    URINE, CLEAN CATCH Performed at [facility], [address], [place], [place] [address]    Special Requests   Final    NONE Performed at [facility], [address], [place], [place] [address]    Culture   Final    NO GROWTH Performed at [facility], [place] [address], [place], [place] [address]    Report Status 09/15/2018 FINAL  Final  Blood Culture (routine x 2)      Status: None (Preliminary result)   Collection Time: 09/15/18  7:41 PM  Result Value Ref Range Status   Specimen Description PORTA CATH DRAWN BY RN  Final   Special Requests   Final    BOTTLES DRAWN AEROBIC AND ANAEROBIC Blood Culture adequate volume   Culture   Final    NO GROWTH 4 DAYS Performed at [facility], [address], [place], [place] [address]    Report Status PENDING  Incomplete  Blood Culture (routine x 2)     Status: None (Preliminary result)   Collection Time: 09/15/18  7:55 PM  Result Value Ref Range Status   Specimen Description BLOOD LEFT HAND  Final   Special Requests   Final    BOTTLES DRAWN AEROBIC AND ANAEROBIC Blood Culture adequate volume   Culture   Final    NO GROWTH 4 DAYS Performed at [facility], [address], [place], [place] [address]    Report Status PENDING  Incomplete  MRSA PCR Screening     Status: None   Collection Time: 09/15/18 10:02 PM  Result Value Ref Range Status   MRSA by PCR NEGATIVE NEGATIVE Final    Comment:        The GeneXpert MRSA Assay (FDA approved for NASAL specimens only), is one component of a comprehensive MRSA colonization surveillance program. It is not intended to diagnose MRSA infection nor to guide or monitor treatment for MRSA infections. Performed at [facility], [address], [place], [place] [address]   Urine culture     Status: None   Collection Time: 09/15/18 10:29 PM  Result Value Ref Range Status   Specimen Description   Final    URINE, CLEAN CATCH Performed at [facility], [address] [place],  [address]    Special Requests   Final    NONE Performed at [facility],  [address], [place], [place] [address]    Culture   Final    Multiple bacterial morphotypes present, none predominant. Suggest appropriate recollection if clinically indicated.   Report Status 09/17/2018 FINAL  Final  C difficile quick scan w PCR reflex     Status: None   Collection Time: 09/18/18  1:28 AM    Result Value Ref Range Status   C Diff antigen NEGATIVE NEGATIVE Final   C Diff toxin NEGATIVE NEGATIVE Final   C Diff interpretation No C. difficile detected.  Final    Comment: Performed at [facility], [address], [place], [place] [address]  Culture, blood (routine x 2)     Status: None (Preliminary result)   Collection Time: 09/18/18  4:23 PM  Result Value Ref Range Status   Specimen Description BLOOD LEFT HAND  Final   Special Requests   Final    BOTTLES DRAWN AEROBIC AND ANAEROBIC Blood Culture adequate volume   Culture   Final    NO GROWTH < 24 HOURS Performed at [facility], [address], [place], [place] [address]    Report Status PENDING  Incomplete  Culture, blood (routine x 2)     Status: None (Preliminary result)   Collection Time: 09/18/18  4:35 PM  Result Value Ref Range Status   Specimen Description BLOOD LEFT WRIST  Final   Special Requests   Final    BOTTLES DRAWN AEROBIC AND ANAEROBIC Blood Culture adequate volume   Culture   Final    NO GROWTH < 24 HOURS Performed at [facility], [address], [place], [place] [address]    Report Status PENDING  Incomplete  Respiratory Panel by PCR     Status: None   Collection Time: 09/19/18  7:27 AM  Result Value Ref Range Status   Adenovirus NOT DETECTED NOT DETECTED Final   Coronavirus 229E NOT DETECTED NOT DETECTED Final   Coronavirus HKU1 NOT DETECTED NOT DETECTED Final   Coronavirus NL63 NOT DETECTED NOT DETECTED Final   Coronavirus OC43 NOT DETECTED NOT DETECTED Final   Metapneumovirus NOT DETECTED NOT DETECTED Final   Rhinovirus / Enterovirus NOT DETECTED NOT DETECTED Final   Influenza A NOT DETECTED NOT DETECTED Final   Influenza B NOT DETECTED NOT DETECTED Final   Parainfluenza Virus 1 NOT DETECTED NOT DETECTED Final   Parainfluenza Virus 2 NOT DETECTED NOT DETECTED Final   Parainfluenza Virus 3 NOT DETECTED NOT DETECTED Final   Parainfluenza Virus 4 NOT DETECTED NOT DETECTED Final   Respiratory  Syncytial Virus NOT DETECTED NOT DETECTED Final   Bordetella pertussis NOT DETECTED NOT DETECTED Final   Chlamydophila pneumoniae NOT DETECTED NOT DETECTED Final   Mycoplasma pneumoniae NOT DETECTED NOT DETECTED Final    Comment: Performed at [facility], [place] [address] [place], [place] [address]         Radiology Studies: No results found.      Scheduled Meds: . enoxaparin (LOVENOX) injection  40 mg Subcutaneous Q24H  . folic acid  1 mg Oral Daily  . ipratropium-albuterol  3 mL Nebulization TID  . levothyroxine  25 mcg Oral QAC breakfast   Continuous Infusions: . sodium chloride 10 mL/hr at 09/19/18 1500  . ceFEPime (MAXIPIME) IV Stopped (09/19/18 0910)     LOS: 4 days      Alma Friendly, MD [facility]  If 7PM-7AM, please contact night-coverage [email or web address] 09/19/2018, 4:07 PM

## 2018-09-19 NOTE — Clinical Social Work Note (Signed)
Patient advised that [facility] and [facility] had declined bed offer. Patient to consider HHPT vs going to facilities in [place].  Patient stated that she will call LCSW with her choices of facilities in [place].    Dheeraj Hail, Clydene Pugh, LCSW

## 2018-09-19 NOTE — Care Management (Signed)
CM to visit patient and discuss back up plan if she is unable to get into SNF. Lives with daughter who does not work but has 2 small children she cares for. Pt is on oxygen acutely. Pt and sister would also like to be faxed out to [facility], [place], and [employer]. Pt's sister at the bedside and advocates that it would not be safe for patient to return home.

## 2018-09-20 ENCOUNTER — Inpatient Hospital Stay (HOSPITAL_COMMUNITY): Payer: Medicare Other

## 2018-09-20 DIAGNOSIS — J9621 Acute and chronic respiratory failure with hypoxia: Secondary | ICD-10-CM | POA: Diagnosis present

## 2018-09-20 DIAGNOSIS — J449 Chronic obstructive pulmonary disease, unspecified: Secondary | ICD-10-CM

## 2018-09-20 LAB — CBC WITH DIFFERENTIAL/PLATELET
BASOS PCT: 0 %
Basophils Absolute: 0 10*3/uL (ref 0.0–0.1)
Eosinophils Absolute: 0 10*3/uL (ref 0.0–0.7)
Eosinophils Relative: 0 %
HEMATOCRIT: 25.1 % — AB (ref 36.0–46.0)
HEMOGLOBIN: 8.4 g/dL — AB (ref 12.0–15.0)
LYMPHS ABS: 0.2 10*3/uL — AB (ref 0.7–4.0)
LYMPHS PCT: 3 %
MCH: 28.9 pg (ref 26.0–34.0)
MCHC: 33.5 g/dL (ref 30.0–36.0)
MCV: 86.3 fL (ref 78.0–100.0)
Monocytes Absolute: 0.2 10*3/uL (ref 0.1–1.0)
Monocytes Relative: 2 %
NEUTROS ABS: 7.1 10*3/uL (ref 1.7–7.7)
Neutrophils Relative %: 95 %
Platelets: 369 10*3/uL (ref 150–400)
RBC: 2.91 MIL/uL — ABNORMAL LOW (ref 3.87–5.11)
RDW: 15.9 % — ABNORMAL HIGH (ref 11.5–15.5)
WBC: 7.5 10*3/uL (ref 4.0–10.5)

## 2018-09-20 LAB — BASIC METABOLIC PANEL
ANION GAP: 9 (ref 5–15)
Anion gap: 8 (ref 5–15)
BUN: 9 mg/dL (ref 8–23)
BUN: 10 mg/dL (ref 8–23)
CALCIUM: 7.7 mg/dL — AB (ref 8.9–10.3)
CALCIUM: 8 mg/dL — AB (ref 8.9–10.3)
CO2: 26 mmol/L (ref 22–32)
CO2: 26 mmol/L (ref 22–32)
Chloride: 104 mmol/L (ref 98–111)
Chloride: 103 mmol/L (ref 98–111)
Creatinine, Ser: <0.3 mg/dL — ABNORMAL LOW (ref 0.44–1.00)
Creatinine, Ser: 0.39 mg/dL — ABNORMAL LOW (ref 0.44–1.00)
GFR calc Af Amer: >60 mL/min (ref >60)
GFR calc non Af Amer: >60 mL/min (ref >60)
GLUCOSE: 142 mg/dL — AB (ref 70–99)
GLUCOSE: 170 mg/dL — AB (ref 70–99)
POTASSIUM: 2.9 mmol/L — AB (ref 3.5–5.1)
Potassium: 3.1 mmol/L — ABNORMAL LOW (ref 3.5–5.1)
Sodium: 138 mmol/L (ref 135–145)
Sodium: 138 mmol/L (ref 135–145)

## 2018-09-20 LAB — CULTURE, BLOOD (ROUTINE X 2)
CULTURE: NO GROWTH
CULTURE: NO GROWTH
SPECIAL REQUESTS: ADEQUATE
Special Requests: ADEQUATE

## 2018-09-20 LAB — MAGNESIUM: Magnesium: 1.9 mg/dL (ref 1.7–2.4)

## 2018-09-20 MED ORDER — PANTOPRAZOLE SODIUM 40 MG PO TBEC
40.0000 mg | DELAYED_RELEASE_TABLET | Freq: Every day | ORAL | Status: DC
  Administered 2018-09-21 – 2018-09-23 (×3): 40 mg via ORAL
  Filled 2018-09-20 (×3): qty 1

## 2018-09-20 MED ORDER — FUROSEMIDE 10 MG/ML IJ SOLN
20.0000 mg | Freq: Once | INTRAMUSCULAR | Status: AC
  Administered 2018-09-20: 20 mg via INTRAVENOUS
  Filled 2018-09-20: qty 2

## 2018-09-20 MED ORDER — BUDESONIDE 0.5 MG/2ML IN SUSP
0.5000 mg | Freq: Two times a day (BID) | RESPIRATORY_TRACT | Status: DC
  Administered 2018-09-20 – 2018-09-23 (×7): 0.5 mg via RESPIRATORY_TRACT
  Filled 2018-09-20 (×7): qty 2

## 2018-09-20 MED ORDER — POTASSIUM CHLORIDE CRYS ER 20 MEQ PO TBCR
40.0000 meq | EXTENDED_RELEASE_TABLET | Freq: Once | ORAL | Status: AC
  Administered 2018-09-20: 40 meq via ORAL
  Filled 2018-09-20: qty 2

## 2018-09-20 MED ORDER — POTASSIUM CHLORIDE CRYS ER 20 MEQ PO TBCR
40.0000 meq | EXTENDED_RELEASE_TABLET | ORAL | Status: AC
  Administered 2018-09-20 (×2): 40 meq via ORAL
  Filled 2018-09-20 (×2): qty 2

## 2018-09-20 MED ORDER — FLUTICASONE PROPIONATE 50 MCG/ACT NA SUSP
2.0000 | Freq: Every day | NASAL | Status: DC
  Administered 2018-09-20 – 2018-09-23 (×4): 2 via NASAL
  Filled 2018-09-20 (×2): qty 16

## 2018-09-20 NOTE — Progress Notes (Signed)
PT Cancellation Note  Patient Details Name: EDILIA GHUMAN MRN: [id number] DOB: 1951-07-17   Cancelled Treatment:    Reason Eval/Treat Not Completed: Patient declined, no reason specified;Other (comment).  Pt was eating a meal and then very lethargic and per family had been sleeping most of the afternoon.  Will try again at another time.   Ramond Dial 09/20/2018, 8:31 PM   Mee Hives, PT MS Acute Rehab Dept. Number: [place] [facility] [place]

## 2018-09-20 NOTE — Care Management Important Message (Signed)
Important Message  Patient Details  Name: Theresa Vincent MRN: [id number] Date of Birth: 1951/01/15   Medicare Important Message Given:  Yes    Sherald Barge, RN 09/20/2018, 11:06 AM

## 2018-09-20 NOTE — Progress Notes (Signed)
PROGRESS NOTE    Theresa Vincent  QMV:[id number] DOB: Nov 06, 1951 DOA: 09/15/2018 PCP: Sharilyn Sites, MD    Brief Narrative:  Patient is a 67 year old woman admitted from home on 9/22 due to weakness, dizziness and nausea.  She has a history of lung cancer metastatic to brain who has completed her radiation treatment and just a week ago had her first chemotherapy treatment.  She states she was discharged on Zofran and Phenergan and those have not helped her nausea.  She notes that she has been unable to keep up with her hydration status and feels very dehydrated.  She met sepsis criteria on admission and was admitted for further evaluation and management.    Assessment & Plan:   Principal Problem:   Acute on chronic respiratory failure with hypoxia (HCC) Active Problems:   HCAP (healthcare-associated pneumonia)   Hypothyroidism following radioiodine therapy   Non-small cell lung cancer, right ([place])   Brain metastases ([place])   Sepsis ([place])   COPD (chronic obstructive pulmonary disease) ([place])  #1 acute on chronic respiratory failure with hypoxia likely secondary to healthcare associated pneumonia Patient noted to have a worsening respiratory status yesterday with worsening shortness of breath.  Patient noted to have diffuse coarse breath sounds with crackles on examination today.  Patient with history of lung cancer with mets to the brain.  Patient was started on IV steroids yesterday due to bilateral expiratory wheezing and worsening shortness of breath.  Will repeat chest x-ray today.  Continue IV Solu-Medrol, empiric IV cefepime, scheduled duo nebs.  Will add Pulmicort.  We will give a dose of Lasix IV x1.  Follow.  If worse tori status continues to worsen will need to get a ABG and may need a CT of the chest.  Follow for now.  2.  Sepsis secondary to healthcare associated pneumonia Patient admitted meeting criteria for sepsis and noted to have a fever with a temp of 102.7 on admission.   Patient also noted to be neutropenic on admission with a white count of 2.9.  Fever curve seems to be slowly trending down.  Leukocytosis improving.  Patient has been pancultured and labs pending with no growth to date.  Rest tori viral panel was negative.  C. difficile PCR negative.  Urine cultures negative.  MRSA PCR negative.  Chest x-ray concerning for pneumonia.  Continue empiric IV antibiotics.  Supportive care.  Follow.  3.  Hypokalemia/hypomagnesemia Replete.  Repeat labs this afternoon.  Follow.  4.  Non-small cell lung cancer with mets to the brain Outpatient follow-up with oncology.  5.  Hypothyroidism Continue Synthroid.  Will need outpatient follow-up.  6.  COPD exacerbation Patient noted to have expiratory wheezing bilaterally yesterday concern for probable COPD exacerbation.  Continue IV steroids, empiric IV cefepime, scheduled duo nebs.  We will add Pulmicort and Flonase to patient's regimen.  Add PPI.    DVT prophylaxis: Lovenox Code Status: DNR Family Communication: Updated patient and ex-husband at bedside. Disposition Plan: Home when clinically improved, improvement with shortness of breath and oxygenation back to baseline.   Consultants:   None  Procedures:   CT head 09/16/2018  Chest x-ray 09/20/2018 pending  Chest x-ray 09/15/2018, 09/14/2018  Abdominal films 09/15/2018  Antimicrobials:   IV cefepime 09/16/2018  IV vancomycin 09/15/2018>>>> 09/16/2018   Subjective: Patient sitting up in bed, states no significant improvement with shortness of breath since admission.  Still with some wheezing.  Still with a productive cough.  Some chest pain with coughing.  Objective: Vitals:   09/19/18 2055 09/20/18 0558 09/20/18 0815 09/20/18 1115  BP: (!) 104/54 (!) 92/48    Pulse: (!) 115 (!) 108    Resp: 18 17    Temp: 98 F (36.7 C) 97.9 F (36.6 C)    TempSrc:      SpO2: 95% 92% 93% 91%  Weight:      Height:        Intake/Output Summary (Last 24 hours)  at 09/20/2018 1316 Last data filed at 09/20/2018 0600 Gross per 24 hour  Intake 644.24 ml  Output 1650 ml  Net -1005.76 ml   Filed Weights   09/15/18 1830 09/15/18 2207 09/16/18 0600  Weight: 65 kg 65.2 kg 65.2 kg    Examination:  General exam: Appears calm and comfortable  Respiratory system: Diffuse crackles, some rhonchi, some coarse breath sounds, minimal expiratory wheezing, poor to fair air movement.  Speaking in choppy sentences.  Cardiovascular system: S1 & S2 heard, RRR. No JVD, murmurs, rubs, gallops or clicks. No pedal edema. Gastrointestinal system: Abdomen is nondistended, soft and nontender. No organomegaly or masses felt. Normal bowel sounds heard. Central nervous system: Alert and oriented. No focal neurological deficits. Extremities: Symmetric 5 x 5 power. Skin: No rashes, lesions or ulcers Psychiatry: Judgement and insight appear normal. Mood & affect appropriate.     Data Reviewed: I have personally reviewed following labs and imaging studies  CBC: Recent Labs  Lab 09/14/18 1107 09/15/18 1941 09/16/18 0424 09/17/18 0521 09/18/18 0422 09/19/18 0525 09/20/18 0547  WBC 2.9* 5.5 5.9 7.5 8.6 8.3 7.5  NEUTROABS 1.8 4.5  --   --   --  7.7 7.1  HGB 12.0 10.8* 9.9* 9.2* 9.0* 8.9* 8.4*  HCT 35.6* 32.0* 30.2* 27.3* 27.4* 26.8* 25.1*  MCV 87.7 86.7 87.5 87.2 87.3 87.3 86.3  PLT 186 230 243 275 361 350 130   Basic Metabolic Panel: Recent Labs  Lab 09/16/18 0424 09/17/18 0521 09/17/18 0959 09/18/18 0422 09/19/18 0525 09/20/18 0547 09/20/18 0844  NA 138 137  --  137 138 138  --   K 3.1* 2.3*  --  3.5 3.5 2.9*  --   CL 106 104  --  106 106 104  --   CO2 23 23  --  '22 24 26  '$ --   GLUCOSE 122* 106*  --  143* 110* 142*  --   BUN 6* 5*  --  <5* 7* 9  --   CREATININE 0.35* 0.30*  --  0.32* 0.33* <0.30*  --   CALCIUM 7.7* 7.4*  --  7.3* 7.6* 7.7*  --   MG  --   --  1.6*  --  1.9  --  1.9   GFR: CrCl cannot be calculated (This lab value cannot be used to  calculate CrCl because it is not a number: <0.30). Liver Function Tests: Recent Labs  Lab 09/14/18 1107 09/15/18 1941 09/16/18 0424  AST 35 24 20  ALT '20 18 16  '$ ALKPHOS 80 75 68  BILITOT 1.6* 0.7 0.7  PROT 6.4* 6.0* 5.4*  ALBUMIN 3.0* 2.6* 2.3*   Recent Labs  Lab 09/15/18 1941  LIPASE 18   No results for input(s): AMMONIA in the last 168 hours. Coagulation Profile: No results for input(s): INR, PROTIME in the last 168 hours. Cardiac Enzymes: Recent Labs  Lab 09/15/18 1941  TROPONINI <0.03   BNP (last 3 results) No results for input(s): PROBNP in the last 8760 hours. HbA1C: No results for input(s):  HGBA1C in the last 72 hours. CBG: No results for input(s): GLUCAP in the last 168 hours. Lipid Profile: No results for input(s): CHOL, HDL, LDLCALC, TRIG, CHOLHDL, LDLDIRECT in the last 72 hours. Thyroid Function Tests: No results for input(s): TSH, T4TOTAL, FREET4, T3FREE, THYROIDAB in the last 72 hours. Anemia Panel: No results for input(s): VITAMINB12, FOLATE, FERRITIN, TIBC, IRON, RETICCTPCT in the last 72 hours. Sepsis Labs: Recent Labs  Lab 09/15/18 2002  LATICACIDVEN 1.17    Recent Results (from the past 240 hour(s))  Urine Culture     Status: None   Collection Time: 09/14/18 11:07 AM  Result Value Ref Range Status   Specimen Description   Final    URINE, CLEAN CATCH Performed at [facility], [address], [place], [place] [address]    Special Requests   Final    NONE Performed at [facility], [address], [place], [place] [address]    Culture   Final    NO GROWTH Performed at [facility], [place] [address] [place], [place] [address]    Report Status 09/15/2018 FINAL  Final  Blood Culture (routine x 2)     Status: None   Collection Time: 09/15/18  7:41 PM  Result Value Ref Range Status   Specimen Description PORTA CATH DRAWN BY RN  Final   Special Requests   Final    BOTTLES DRAWN AEROBIC AND ANAEROBIC Blood Culture adequate volume    Culture   Final    NO GROWTH 5 DAYS Performed at [facility], [address] [place], [place] [address]    Report Status 09/20/2018 FINAL  Final  Blood Culture (routine x 2)     Status: None   Collection Time: 09/15/18  7:55 PM  Result Value Ref Range Status   Specimen Description BLOOD LEFT HAND  Final   Special Requests   Final    BOTTLES DRAWN AEROBIC AND ANAEROBIC Blood Culture adequate volume   Culture   Final    NO GROWTH 5 DAYS Performed at [facility], [address] [place], [place] [address]    Report Status 09/20/2018 FINAL  Final  MRSA PCR Screening     Status: None   Collection Time: 09/15/18 10:02 PM  Result Value Ref Range Status   MRSA by PCR NEGATIVE NEGATIVE Final    Comment:        The GeneXpert MRSA Assay (FDA approved for NASAL specimens only), is one component of a comprehensive MRSA colonization surveillance program. It is not intended to diagnose MRSA infection nor to guide or monitor treatment for MRSA infections. Performed at [facility], [address], [place], [place] [address]   Urine culture     Status: None   Collection Time: 09/15/18 10:29 PM  Result Value Ref Range Status   Specimen Description   Final    URINE, CLEAN CATCH Performed at [facility], [address], [place], [place] [address]    Special Requests   Final    NONE Performed at [facility], [address], [place], [place] [address]    Culture   Final    Multiple bacterial morphotypes present, none predominant. Suggest appropriate recollection if clinically indicated.   Report Status 09/17/2018 FINAL  Final  C difficile quick scan w PCR reflex     Status: None   Collection Time: 09/18/18  1:28 AM  Result Value Ref Range Status   C Diff antigen NEGATIVE NEGATIVE Final   C Diff toxin  NEGATIVE NEGATIVE Final   C Diff interpretation No C. difficile detected.  Final    Comment: Performed at [facility], [address] [place], [place] [address]  Culture, blood (routine x  2)     Status: None (Preliminary result)   Collection Time: 09/18/18  4:23 PM  Result Value Ref Range Status   Specimen Description BLOOD LEFT HAND  Final   Special Requests   Final    BOTTLES DRAWN AEROBIC AND ANAEROBIC Blood Culture adequate volume   Culture   Final    NO GROWTH 2 DAYS Performed at [facility], [address] [place], [place] [address]    Report Status PENDING  Incomplete  Culture, blood (routine x 2)     Status: None (Preliminary result)   Collection Time: 09/18/18  4:35 PM  Result Value Ref Range Status   Specimen Description BLOOD LEFT WRIST  Final   Special Requests   Final    BOTTLES DRAWN AEROBIC AND ANAEROBIC Blood Culture adequate volume   Culture   Final    NO GROWTH 2 DAYS Performed at [facility], [address], [place], [place] [address]    Report Status PENDING  Incomplete  Respiratory Panel by PCR     Status: None   Collection Time: 09/19/18  7:27 AM  Result Value Ref Range Status   Adenovirus NOT DETECTED NOT DETECTED Final   Coronavirus 229E NOT DETECTED NOT DETECTED Final   Coronavirus HKU1 NOT DETECTED NOT DETECTED Final   Coronavirus NL63 NOT DETECTED NOT DETECTED Final   Coronavirus OC43 NOT DETECTED NOT DETECTED Final   Metapneumovirus NOT DETECTED NOT DETECTED Final   Rhinovirus / Enterovirus NOT DETECTED NOT DETECTED Final   Influenza A NOT DETECTED NOT DETECTED Final   Influenza B NOT DETECTED NOT DETECTED Final   Parainfluenza Virus 1 NOT DETECTED NOT DETECTED Final   Parainfluenza Virus 2 NOT DETECTED NOT DETECTED Final   Parainfluenza Virus 3 NOT DETECTED NOT DETECTED Final   Parainfluenza Virus 4 NOT DETECTED NOT DETECTED Final   Respiratory Syncytial Virus NOT DETECTED NOT DETECTED Final   Bordetella pertussis NOT DETECTED NOT DETECTED Final   Chlamydophila pneumoniae NOT DETECTED NOT DETECTED Final   Mycoplasma pneumoniae NOT DETECTED NOT DETECTED Final    Comment: Performed at [facility], [place] [address],  [place], [place] [address]         Radiology Studies: No results found.      Scheduled Meds: . budesonide (PULMICORT) nebulizer solution  0.5 mg Nebulization BID  . enoxaparin (LOVENOX) injection  40 mg Subcutaneous Q24H  . fluticasone  2 spray Each Nare Daily  . folic acid  1 mg Oral Daily  . furosemide  20 mg Intravenous Once  . ipratropium-albuterol  3 mL Nebulization TID  . levothyroxine  25 mcg Oral QAC breakfast  . methylPREDNISolone (SOLU-MEDROL) injection  60 mg Intravenous Q12H   Continuous Infusions: . sodium chloride 10 mL/hr at 09/19/18 1500  . ceFEPime (MAXIPIME) IV 2 g (09/20/18 0801)     LOS: 5 days    Time spent: 40 minutes    Irine Seal, MD [facility] Pager [phone]  If 7PM-7AM, please contact night-coverage [email or web address] Password [email or web address] 09/20/2018, 1:16 PM

## 2018-09-21 LAB — CBC WITH DIFFERENTIAL/PLATELET
BASOS ABS: 0 10*3/uL (ref 0.0–0.1)
Basophils Relative: 0 %
Eosinophils Absolute: 0 10*3/uL (ref 0.0–0.7)
Eosinophils Relative: 0 %
HCT: 25.1 % — ABNORMAL LOW (ref 36.0–46.0)
HEMOGLOBIN: 8.1 g/dL — AB (ref 12.0–15.0)
LYMPHS PCT: 4 %
Lymphs Abs: 0.3 10*3/uL — ABNORMAL LOW (ref 0.7–4.0)
MCH: 28.4 pg (ref 26.0–34.0)
MCHC: 32.3 g/dL (ref 30.0–36.0)
MCV: 88.1 fL (ref 78.0–100.0)
Monocytes Absolute: 0.3 10*3/uL (ref 0.1–1.0)
Monocytes Relative: 4 %
Neutro Abs: 7.4 10*3/uL (ref 1.7–7.7)
Neutrophils Relative %: 92 %
PLATELETS: 419 10*3/uL — AB (ref 150–400)
RBC: 2.85 MIL/uL — AB (ref 3.87–5.11)
RDW: 16.7 % — ABNORMAL HIGH (ref 11.5–15.5)
WBC: 8 10*3/uL (ref 4.0–10.5)

## 2018-09-21 LAB — BASIC METABOLIC PANEL
Anion gap: 9 (ref 5–15)
BUN: 12 mg/dL (ref 8–23)
CHLORIDE: 105 mmol/L (ref 98–111)
CO2: 28 mmol/L (ref 22–32)
Calcium: 8.2 mg/dL — ABNORMAL LOW (ref 8.9–10.3)
Creatinine, Ser: <0.3 mg/dL — ABNORMAL LOW (ref 0.44–1.00)
Glucose, Bld: 146 mg/dL — ABNORMAL HIGH (ref 70–99)
Potassium: 4.2 mmol/L (ref 3.5–5.1)
SODIUM: 142 mmol/L (ref 135–145)

## 2018-09-21 MED ORDER — HYDROCORTISONE VALERATE 0.2 % EX CREA
TOPICAL_CREAM | Freq: Two times a day (BID) | CUTANEOUS | Status: DC
  Administered 2018-09-22 – 2018-09-23 (×3): via TOPICAL
  Filled 2018-09-21: qty 15

## 2018-09-21 NOTE — Progress Notes (Addendum)
Pharmacy Antibiotic Note  Theresa Vincent is a 67 y.o. female  cancer patient admitted on 09/15/2018 with fever of unknow source .  Pharmacy has been consulted for cefepime  dosing. Patient currently afebrile, no leukocytosis, LA normalized. BCx ngtd. Chest Xray concerning for PNA.   Plan: Continue cefepime 2g IV q12h  F/U cultures and patient progress F/U deescalation of abx Monitor V/S, labs  Height: 5\' 2"  (157.5 cm) Weight: 157 lb 3 oz (71.3 kg) IBW/kg (Calculated) : 50.1  Temp (24hrs), Avg:98.3 F (36.8 C), Min:97.9 F (36.6 C), Max:98.7 F (37.1 C)  Recent Labs  Lab 09/15/18 2002  09/17/18 0521 09/18/18 0422 09/19/18 0525 09/20/18 0547 09/20/18 1557 09/21/18 0559  WBC  --    < > 7.5 8.6 8.3 7.5  --  8.0  CREATININE  --    < > 0.30* 0.32* 0.33* <0.30* 0.39* <0.30*  LATICACIDVEN 1.17  --   --   --   --   --   --   --    < > = values in this interval not displayed.    CrCl cannot be calculated (This lab value cannot be used to calculate CrCl because it is not a number: <0.30).    Allergies  Allergen Reactions  . Avelox [Moxifloxacin Hcl In Nacl] Itching    Antimicrobials this admission: metronidazole 9/22 >>9/22  cefepime 9/22 >>   vancomycin 9/22>>9/23 Merrem 1gm IV x 1 9/23 zyvox 600mg  IV x 1 9/23  Microbiology results: 9/25 BCx: ngtd 9/22 BCx2: Ng 9/22 UCx: multiple species 9/21 UCx: NG (final) 9/23 MRSA PCR: neg  Thank you for allowing pharmacy to participate in  this patient's care.  Margot Ables, PharmD Clinical Pharmacist 09/21/2018 11:01 AM

## 2018-09-21 NOTE — Progress Notes (Signed)
PROGRESS NOTE  Theresa Vincent BHA:[id number] DOB: 1951/01/19 DOA: 09/15/2018 PCP: Sharilyn Sites, MD  Brief Narrative: 67 year old female admitted from home on September 15, 2018 due to weakness dizziness and nausea.  She has a history of lung cancer with metastasis to the brain.  She has completed 10 rounds of radiation therapy she is currently on chemotherapy.  She was admitted for pneumonia with sepsis and respiratory failure    Interval history/Subjective: Patient seen and examined at bedside.  Patient complained of chest pain with breathing also she still coughing.  Assessment/Plan  1.Acute on chronic respiratory failure with hypoxia likely secondary to healthcare acquired pneumonia. 2.  Healthcare acquired pneumonia patient's urine culture was negative PCR was negative respiratory cultures were negative her chest x-ray however was concerning for pneumonia she is on antibiotics will continue on complete her antibiotics 4.  Non-small cell lung cancer with mets to the brain to continue outpatient oncology treatment. 5.  Hypothyroidism continue Synthroid 6.  COPD exacerbation patient has expiratory wheeze and rhonchi and she still has some coughing.  We will continue empiric antibiotics with PPI IV steroid   DVT prophylaxis: Lovenox Code Status: DNR Family Communication: Daughter Theresa Vincent. Theresa Vincent by phone Disposition Plan: Rehab placement  Dr. Kyung Bacca [facility] Pager [phone]  09/21/2018, 2:20 PM  LOS: 6 days   Consultants:  None  Procedures:  CT scan of the head abdominal x-ray chest x-ray  Antimicrobials:  Cefepime vancomycin   Objective: Vitals:  Vitals:   09/21/18 0628 09/21/18 0814  BP: 111/67   Pulse: 90   Resp: 18   Temp: 97.9 F (36.6 C)   SpO2: 95% 91%    Exam:  Constitutional:  . Appears calm and comfortable Eyes:  . pupils and irises appear normal . Normal lids and conjunctivae ENMT:  . grossly normal hearing  . Lips appear  normal . external ears, nose appear normal . Oropharynx: mucosa, tongue,posterior pharynx appear normal Neck:  . neck appears normal, no masses, normal ROM, supple . no thyromegaly Respiratory:  . No rales noted on the left lung fields no wheezing . Respiratory effort normal. No retractions or accessory muscle use Cardiovascular:  . RRR, no m/r/g . Trace LE extremity edema   . Normal pedal pulses Abdomen:  . Abdomen appears normal; no tenderness or masses . No hernias . No HSM Musculoskeletal:  . Digits/nails BUE: no clubbing, cyanosis, petechiae, infection . exam of joints, bones, muscles of at least one of following: head/neck, RUE, LUE, RLE, LLE   o strength and tone normal, no atrophy, no abnormal movements o No tenderness, masses o Normal ROM, no contractures  . gait and station Skin:  . No  lesions, ulcers edematous itchy rash on her arms and buttocks . palpation of skin: no induration or nodules Neurologic:  . CN 2-12 intact . Sensation all 4 extremities intact Psychiatric:  . Mental status o Mood, affect appropriate o Orientation to person, place, time  . judgment and insight appear intact     I have personally reviewed the following:   DataResults for Theresa, Vincent (MRN [id number]) as of 09/21/2018 17:15  Ref. Range 09/21/2018 96:22  BASIC METABOLIC PANEL Unknown Rpt (A)  Sodium Latest Ref Range: 135 - 145 mmol/L 142  Potassium Latest Ref Range: 3.5 - 5.1 mmol/L 4.2  Chloride Latest Ref Range: 98 - 111 mmol/L 105  CO2 Latest Ref Range: 22 - 32 mmol/L 28  Glucose Latest Ref Range: 70 - 99 mg/dL 146 (H)  BUN Latest Ref Range: 8 - 23 mg/dL 12  Creatinine Latest Ref Range: 0.44 - 1.00 mg/dL <0.30 (L)  Calcium Latest Ref Range: 8.9 - 10.3 mg/dL 8.2 (L)  Anion gap Latest Ref Range: 5 - 15  9  GFR, Est Non African American Latest Ref Range: >60 mL/min NOT CALCULATED  GFR, Est African American Latest Ref Range: >60 mL/min NOT CALCULATED  WBC Latest Ref Range: 4.0  - 10.5 K/uL 8.0  RBC Latest Ref Range: 3.87 - 5.11 MIL/uL 2.85 (L)  Hemoglobin Latest Ref Range: 12.0 - 15.0 g/dL 8.1 (L)  HCT Latest Ref Range: 36.0 - 46.0 % 25.1 (L)  MCV Latest Ref Range: 78.0 - 100.0 fL 88.1  MCH Latest Ref Range: 26.0 - 34.0 pg 28.4  MCHC Latest Ref Range: 30.0 - 36.0 g/dL 32.3  RDW Latest Ref Range: 11.5 - 15.5 % 16.7 (H)  Platelets Latest Ref Range: 150 - 400 K/uL 419 (H)  Neutrophils Latest Units: % 92  Lymphocytes Latest Units: % 4  Monocytes Relative Latest Units: % 4  Eosinophil Latest Units: % 0  Basophil Latest Units: % 0  NEUT# Latest Ref Range: 1.7 - 7.7 K/uL 7.4  Lymphocyte # Latest Ref Range: 0.7 - 4.0 K/uL 0.3 (L)  Monocyte # Latest Ref Range: 0.1 - 1.0 K/uL 0.3  Eosinophils Absolute Latest Ref Range: 0.0 - 0.7 K/uL 0.0  Basophils Absolute Latest Ref Range: 0.0 - 0.1 K/uL 0.0  RBC Morphology Unknown RARE NRBCs  WBC Morphology Unknown MILD LEFT SHIFT (1-5% METAS, OCC MYELO, OCC BANDS)    Scheduled Meds: . budesonide (PULMICORT) nebulizer solution  0.5 mg Nebulization BID  . enoxaparin (LOVENOX) injection  40 mg Subcutaneous Q24H  . fluticasone  2 spray Each Nare Daily  . folic acid  1 mg Oral Daily  . ipratropium-albuterol  3 mL Nebulization TID  . levothyroxine  25 mcg Oral QAC breakfast  . methylPREDNISolone (SOLU-MEDROL) injection  60 mg Intravenous Q12H  . pantoprazole  40 mg Oral Q0600   Continuous Infusions: . sodium chloride 10 mL/hr at 09/19/18 1500  . ceFEPime (MAXIPIME) IV 2 g (09/21/18 6811)    Principal Problem:   Acute on chronic respiratory failure with hypoxia (HCC) Active Problems:   Hypothyroidism following radioiodine therapy   Non-small cell lung cancer, right ([place])   Brain metastases ([place])   Sepsis ([place])   HCAP (healthcare-associated pneumonia)   COPD (chronic obstructive pulmonary disease) ([place])   LOS: 6 days

## 2018-09-22 LAB — CBC WITH DIFFERENTIAL/PLATELET
BASOS PCT: 0 %
Basophils Absolute: 0 10*3/uL (ref 0.0–0.1)
EOS ABS: 0 10*3/uL (ref 0.0–0.7)
Eosinophils Relative: 0 %
HEMATOCRIT: 24.5 % — AB (ref 36.0–46.0)
HEMOGLOBIN: 8.1 g/dL — AB (ref 12.0–15.0)
LYMPHS PCT: 4 %
Lymphs Abs: 0.3 10*3/uL — ABNORMAL LOW (ref 0.7–4.0)
MCH: 29.5 pg (ref 26.0–34.0)
MCHC: 33.1 g/dL (ref 30.0–36.0)
MCV: 89.1 fL (ref 78.0–100.0)
MONOS PCT: 4 %
Monocytes Absolute: 0.3 10*3/uL (ref 0.1–1.0)
NEUTROS ABS: 7.4 10*3/uL (ref 1.7–7.7)
Neutrophils Relative %: 92 %
PLATELETS: 433 10*3/uL — AB (ref 150–400)
RBC: 2.75 MIL/uL — ABNORMAL LOW (ref 3.87–5.11)
RDW: 17.1 % — ABNORMAL HIGH (ref 11.5–15.5)
WBC: 8 10*3/uL (ref 4.0–10.5)

## 2018-09-22 LAB — BASIC METABOLIC PANEL
ANION GAP: 12 (ref 5–15)
BUN: 12 mg/dL (ref 8–23)
CHLORIDE: 105 mmol/L (ref 98–111)
CO2: 25 mmol/L (ref 22–32)
Calcium: 8 mg/dL — ABNORMAL LOW (ref 8.9–10.3)
Creatinine, Ser: <0.3 mg/dL — ABNORMAL LOW (ref 0.44–1.00)
Glucose, Bld: 144 mg/dL — ABNORMAL HIGH (ref 70–99)
POTASSIUM: 3.7 mmol/L (ref 3.5–5.1)
SODIUM: 142 mmol/L (ref 135–145)

## 2018-09-22 NOTE — Progress Notes (Signed)
PROGRESS NOTE  Theresa Vincent LOV:[id number] DOB: Jan 19, 1951 DOA: 09/15/2018 PCP: Sharilyn Sites, MD  Brief Narrative: 67 year old female admitted from home on September 15, 2018 due to weakness dizziness and nausea.  She has a history of lung cancer with metastasis to the brain.  She has completed 10 rounds of radiation therapy she is currently on chemotherapy.  She was admitted for pneumonia with sepsis and respiratory failure    Interval history/Subjective: Patient seen and examined at bedside.  Patient complained of chest pain with breathing also she still coughing.  September 22, 2018:  Patient was seen and examined at the bedside, her daughter Olivia Mackie is in the room she complains of widespread rash that started yesterday September 21, 2018 from her buttocks and her upper arm.  It was initially itchy but today is not itchy but it has spread to her back and her abdomen.  Examination of the rash looks like a drug reaction.  I am not sure what the offending drug is at this time.  She was not started on any new medication.  She only had a one-time dose of Lasix on September 20, 2018 that could be the cause of it.  But I have requested pharmacy to look into it. Overall she feels much better her daughter stated that she looks more alert.  Denies any chest pain or shortness of breath.   Still waiting for rehab placement     Assessment/Plan  1.Acute on chronic respiratory failure with hypoxia likely secondary to healthcare acquired pneumonia. 2.  Healthcare acquired pneumonia patient's urine culture was negative PCR was negative respiratory cultures were negative her chest x-ray however was concerning for pneumonia she is on antibiotics will continue on complete her antibiotics 4.  Non-small cell lung cancer with mets to the brain to continue outpatient oncology treatment. 5.  Hypothyroidism continue Synthroid 6.  COPD exacerbation patient has expiratory wheeze and rhonchi and she still has some  coughing.  We will continue empiric antibiotics with PPI IV steroid 7.  Skin eruption that is erythematous.  Likely a drug reaction.  I have requested pharmacy to look into it.  She received a one-time dose of Lasix this might well be the cause but that was on 27 September.  He has been started on hydrocortisone cream topically she is already on Solu-Medrol 60 mg every 12 hours 8.  Anemia mild we will continue to monitor and follow H&H    DVT prophylaxis: Lovenox Code Status: DNR Family Communication: Daughter [name]. [name] ,at bedside Disposition Plan: Rehab placement  Consultants:  Pharmacy  Procedures:  CT scan of the head abdominal x-ray chest x-ray  Antimicrobials:  Cefepime   vancomycin (discontinued)   Objective: Vitals:  Vitals:   09/22/18 0850 09/22/18 1432  BP:  119/68  Pulse:  87  Resp:  16  Temp:  98.1 F (36.7 C)  SpO2: 91% 94%    Exam:  Constitutional:  . Appears calm and comfortable Eyes:  . pupils and irises appear normal . Normal lids and conjunctivae ENMT:  . grossly normal hearing  . Lips appear normal . external ears, nose appear normal . Oropharynx: mucosa, tongue,posterior pharynx appear normal Neck:  . neck appears normal, no masses, normal ROM, supple . no thyromegaly Respiratory:  . No rales noted on the left lung fields no wheezing . Respiratory effort normal. No retractions or accessory muscle use Cardiovascular:  . RRR, no m/r/g . Trace LE extremity edema   . Normal pedal pulses  Abdomen:  . Abdomen appears normal; no tenderness or masses . No hernias . No HSM Musculoskeletal:  . Digits/nails BUE: no clubbing, cyanosis, petechiae, infection . exam of joints, bones, muscles of at least one of following: head/neck, RUE, LUE, RLE, LLE   o strength and tone normal, no atrophy, no abnormal movements o No tenderness, masses o Normal ROM, no contractures  . gait and station not assessed Skin:  No  lesions, ulcers edematous  itchy rash on her arms and buttocks widespread erythematous rash on her back buttocks abdomen chest right upper arm some of them in sheets like the one on the back is very erythematous. .  . palpation of skin: no induration or nodules Neurologic:  . CN 2-12 intact . Sensation all 4 extremities intact Psychiatric:  . Mental status o Mood, affect appropriate o Orientation to person, place, time  . judgment and insight appear intact     I have personally reviewed the following:   Data Results for Theresa, Vincent (MRN [id number]) as of 09/22/2018 21:11  Ref. Range 09/22/2018 44:31  BASIC METABOLIC PANEL Unknown Rpt (A)  Sodium Latest Ref Range: 135 - 145 mmol/L 142  Potassium Latest Ref Range: 3.5 - 5.1 mmol/L 3.7  Chloride Latest Ref Range: 98 - 111 mmol/L 105  CO2 Latest Ref Range: 22 - 32 mmol/L 25  Glucose Latest Ref Range: 70 - 99 mg/dL 144 (H)  BUN Latest Ref Range: 8 - 23 mg/dL 12  Creatinine Latest Ref Range: 0.44 - 1.00 mg/dL <0.30 (L)  Calcium Latest Ref Range: 8.9 - 10.3 mg/dL 8.0 (L)  Anion gap Latest Ref Range: 5 - 15  12  GFR, Est Non African American Latest Ref Range: >60 mL/min NOT CALCULATED  GFR, Est African American Latest Ref Range: >60 mL/min NOT CALCULATED  WBC Latest Ref Range: 4.0 - 10.5 K/uL 8.0  RBC Latest Ref Range: 3.87 - 5.11 MIL/uL 2.75 (L)  Hemoglobin Latest Ref Range: 12.0 - 15.0 g/dL 8.1 (L)  HCT Latest Ref Range: 36.0 - 46.0 % 24.5 (L)  MCV Latest Ref Range: 78.0 - 100.0 fL 89.1  MCH Latest Ref Range: 26.0 - 34.0 pg 29.5  MCHC Latest Ref Range: 30.0 - 36.0 g/dL 33.1  RDW Latest Ref Range: 11.5 - 15.5 % 17.1 (H)  Platelets Latest Ref Range: 150 - 400 K/uL 433 (H)  Neutrophils Latest Units: % 92  Lymphocytes Latest Units: % 4  Monocytes Relative Latest Units: % 4  Eosinophil Latest Units: % 0  Basophil Latest Units: % 0  NEUT# Latest Ref Range: 1.7 - 7.7 K/uL 7.4  Lymphocyte # Latest Ref Range: 0.7 - 4.0 K/uL 0.3 (L)  Monocyte # Latest Ref Range:  0.1 - 1.0 K/uL 0.3  Eosinophils Absolute Latest Ref Range: 0.0 - 0.7 K/uL 0.0  Basophils Absolute Latest Ref Range: 0.0 - 0.1 K/uL 0.0  WBC Morphology Unknown MILD LEFT SHIFT (1-5% METAS, OCC MYELO, OCC BANDS)    Scheduled Meds: . budesonide (PULMICORT) nebulizer solution  0.5 mg Nebulization BID  . enoxaparin (LOVENOX) injection  40 mg Subcutaneous Q24H  . fluticasone  2 spray Each Nare Daily  . folic acid  1 mg Oral Daily  . hydrocortisone valerate cream   Topical BID  . ipratropium-albuterol  3 mL Nebulization TID  . levothyroxine  25 mcg Oral QAC breakfast  . methylPREDNISolone (SOLU-MEDROL) injection  60 mg Intravenous Q12H  . pantoprazole  40 mg Oral Q0600   Continuous Infusions: . sodium chloride  Stopped (09/20/18 0801)  . ceFEPime (MAXIPIME) IV Stopped (09/22/18 1057)    Principal Problem:   Acute on chronic respiratory failure with hypoxia (HCC) Active Problems:   Hypothyroidism following radioiodine therapy   Non-small cell lung cancer, right ([place])   Brain metastases ([place])   Sepsis ([place])   HCAP (healthcare-associated pneumonia)   COPD (chronic obstructive pulmonary disease) ([place])   LOS: 7 days   Dr. Kyung Bacca [facility] Pager [phone]  09/22/2018, 7:09 PM  LOS: 7 days

## 2018-09-23 ENCOUNTER — Ambulatory Visit: Payer: Medicare Other | Admitting: Radiation Oncology

## 2018-09-23 DIAGNOSIS — Z9981 Dependence on supplemental oxygen: Secondary | ICD-10-CM | POA: Diagnosis not present

## 2018-09-23 DIAGNOSIS — R112 Nausea with vomiting, unspecified: Secondary | ICD-10-CM | POA: Diagnosis not present

## 2018-09-23 DIAGNOSIS — E039 Hypothyroidism, unspecified: Secondary | ICD-10-CM | POA: Diagnosis not present

## 2018-09-23 DIAGNOSIS — Z7401 Bed confinement status: Secondary | ICD-10-CM | POA: Diagnosis not present

## 2018-09-23 DIAGNOSIS — J181 Lobar pneumonia, unspecified organism: Secondary | ICD-10-CM | POA: Diagnosis not present

## 2018-09-23 DIAGNOSIS — R262 Difficulty in walking, not elsewhere classified: Secondary | ICD-10-CM | POA: Diagnosis not present

## 2018-09-23 DIAGNOSIS — C7931 Secondary malignant neoplasm of brain: Secondary | ICD-10-CM | POA: Diagnosis not present

## 2018-09-23 DIAGNOSIS — Z5111 Encounter for antineoplastic chemotherapy: Secondary | ICD-10-CM | POA: Diagnosis not present

## 2018-09-23 DIAGNOSIS — C3491 Malignant neoplasm of unspecified part of right bronchus or lung: Secondary | ICD-10-CM | POA: Diagnosis not present

## 2018-09-23 DIAGNOSIS — R0902 Hypoxemia: Secondary | ICD-10-CM | POA: Diagnosis not present

## 2018-09-23 DIAGNOSIS — K5900 Constipation, unspecified: Secondary | ICD-10-CM | POA: Diagnosis not present

## 2018-09-23 DIAGNOSIS — Z7189 Other specified counseling: Secondary | ICD-10-CM | POA: Diagnosis not present

## 2018-09-23 DIAGNOSIS — Z87891 Personal history of nicotine dependence: Secondary | ICD-10-CM | POA: Diagnosis not present

## 2018-09-23 DIAGNOSIS — R531 Weakness: Secondary | ICD-10-CM | POA: Diagnosis not present

## 2018-09-23 DIAGNOSIS — M6281 Muscle weakness (generalized): Secondary | ICD-10-CM | POA: Diagnosis not present

## 2018-09-23 DIAGNOSIS — J449 Chronic obstructive pulmonary disease, unspecified: Secondary | ICD-10-CM | POA: Diagnosis not present

## 2018-09-23 DIAGNOSIS — J9621 Acute and chronic respiratory failure with hypoxia: Secondary | ICD-10-CM | POA: Diagnosis not present

## 2018-09-23 DIAGNOSIS — R5383 Other fatigue: Secondary | ICD-10-CM | POA: Diagnosis not present

## 2018-09-23 DIAGNOSIS — R41841 Cognitive communication deficit: Secondary | ICD-10-CM | POA: Diagnosis not present

## 2018-09-23 DIAGNOSIS — J9620 Acute and chronic respiratory failure, unspecified whether with hypoxia or hypercapnia: Secondary | ICD-10-CM | POA: Diagnosis not present

## 2018-09-23 DIAGNOSIS — Z803 Family history of malignant neoplasm of breast: Secondary | ICD-10-CM | POA: Diagnosis not present

## 2018-09-23 DIAGNOSIS — Z23 Encounter for immunization: Secondary | ICD-10-CM | POA: Diagnosis not present

## 2018-09-23 DIAGNOSIS — R21 Rash and other nonspecific skin eruption: Secondary | ICD-10-CM | POA: Diagnosis not present

## 2018-09-23 DIAGNOSIS — A419 Sepsis, unspecified organism: Secondary | ICD-10-CM | POA: Diagnosis not present

## 2018-09-23 DIAGNOSIS — R609 Edema, unspecified: Secondary | ICD-10-CM | POA: Diagnosis not present

## 2018-09-23 LAB — CULTURE, BLOOD (ROUTINE X 2)
CULTURE: NO GROWTH
Culture: NO GROWTH
Special Requests: ADEQUATE
Special Requests: ADEQUATE

## 2018-09-23 LAB — CBC WITH DIFFERENTIAL/PLATELET
BASOS ABS: 0.1 10*3/uL (ref 0.0–0.1)
Basophils Relative: 1 %
EOS ABS: 0 10*3/uL (ref 0.0–0.7)
Eosinophils Relative: 0 %
HCT: 27.2 % — ABNORMAL LOW (ref 36.0–46.0)
HEMOGLOBIN: 8.8 g/dL — AB (ref 12.0–15.0)
LYMPHS PCT: 6 %
Lymphs Abs: 0.6 10*3/uL — ABNORMAL LOW (ref 0.7–4.0)
MCH: 28.9 pg (ref 26.0–34.0)
MCHC: 32.4 g/dL (ref 30.0–36.0)
MCV: 89.2 fL (ref 78.0–100.0)
MONO ABS: 0.3 10*3/uL (ref 0.1–1.0)
Monocytes Relative: 3 %
NEUTROS ABS: 9 10*3/uL — AB (ref 1.7–7.7)
NEUTROS PCT: 90 %
PLATELETS: 467 10*3/uL — AB (ref 150–400)
RBC: 3.05 MIL/uL — ABNORMAL LOW (ref 3.87–5.11)
RDW: 17 % — ABNORMAL HIGH (ref 11.5–15.5)
WBC: 10 10*3/uL (ref 4.0–10.5)

## 2018-09-23 LAB — COMPREHENSIVE METABOLIC PANEL
ALBUMIN: 1.9 g/dL — AB (ref 3.5–5.0)
ALT: 102 U/L — ABNORMAL HIGH (ref 0–44)
ANION GAP: 10 (ref 5–15)
AST: 233 U/L — ABNORMAL HIGH (ref 15–41)
Alkaline Phosphatase: 160 U/L — ABNORMAL HIGH (ref 38–126)
BUN: 9 mg/dL (ref 8–23)
CALCIUM: 7.8 mg/dL — AB (ref 8.9–10.3)
CHLORIDE: 102 mmol/L (ref 98–111)
CO2: 28 mmol/L (ref 22–32)
Creatinine, Ser: <0.3 mg/dL — ABNORMAL LOW (ref 0.44–1.00)
GLUCOSE: 135 mg/dL — AB (ref 70–99)
Potassium: 3.3 mmol/L — ABNORMAL LOW (ref 3.5–5.1)
SODIUM: 140 mmol/L (ref 135–145)
Total Bilirubin: 0.5 mg/dL (ref 0.3–1.2)
Total Protein: 5.2 g/dL — ABNORMAL LOW (ref 6.5–8.1)

## 2018-09-23 MED ORDER — GUAIFENESIN-DM 100-10 MG/5ML PO SYRP: 5.0000 mL | ORAL_SOLUTION | ORAL | 0 refills | Status: DC | PRN

## 2018-09-23 MED ORDER — BOOST / RESOURCE BREEZE PO LIQD CUSTOM: 1.0000 | Freq: Three times a day (TID) | ORAL | Status: DC

## 2018-09-23 MED ORDER — PANTOPRAZOLE SODIUM 40 MG PO TBEC: 40.0000 mg | DELAYED_RELEASE_TABLET | Freq: Every day | ORAL | 0 refills | Status: DC

## 2018-09-23 MED ORDER — HYDROCORTISONE VALERATE 0.2 % EX CREA: TOPICAL_CREAM | Freq: Two times a day (BID) | CUTANEOUS | 0 refills | Status: DC

## 2018-09-23 MED ORDER — PREDNISONE 20 MG PO TABS: 40.0000 mg | ORAL_TABLET | Freq: Every day | ORAL | 0 refills | Status: AC

## 2018-09-23 MED ORDER — HYDROCODONE-ACETAMINOPHEN 5-300 MG PO TABS: 1.0000 | ORAL_TABLET | ORAL | 0 refills | Status: AC | PRN

## 2018-09-23 MED ORDER — ENSURE ENLIVE PO LIQD: 237.0000 mL | Freq: Two times a day (BID) | ORAL | 12 refills | Status: DC

## 2018-09-23 MED ORDER — HEPARIN SOD (PORK) LOCK FLUSH 100 UNIT/ML IV SOLN
500.0000 [IU] | INTRAVENOUS | Status: AC | PRN
  Administered 2018-09-23: 500 [IU]
  Filled 2018-09-23: qty 5

## 2018-09-23 MED ORDER — ENSURE ENLIVE PO LIQD
237.0000 mL | Freq: Two times a day (BID) | ORAL | Status: DC
  Administered 2018-09-23: 237 mL via ORAL

## 2018-09-23 NOTE — Progress Notes (Addendum)
Nutrition Brief Note  Patient identified on the Malnutrition Screening Tool (MST) Report. Identified in error. Review of weight hx show significant gain compared to 12 days ago. She has a hx of lung cancer with metastasis to the brain. She presents with pneumonia.   Wt Readings from Last 15 Encounters:  09/23/18 71.4 kg  09/11/18 65.3 kg  09/04/18 65.5 kg  08/08/18 63 kg  08/01/18 63 kg  07/16/18 64.3 kg  07/04/18 67.1 kg  06/26/18 67.1 kg  05/21/18 67.1 kg  04/23/18 67.6 kg  04/08/18 67.1 kg  11/19/17 65.8 kg  05/17/17 65.8 kg  03/04/17 67.6 kg  11/13/16 67.1 kg    Body mass index is 28.79 kg/m. Patient meets criteria for overweight  based on current BMI.   Current diet order is regular and patient is consuming approximately 50% of most meals at this time. Talked with patient and encouraged her to utilize alter alterante meal options. Her appetite is fair and she relays that the smell of trays sometimes nauseates her.   Labs: BMP Latest Ref Rng & Units 09/23/2018 09/22/2018 09/21/2018  Glucose 70 - 99 mg/dL 135(H) 144(H) 146(H)  BUN 8 - 23 mg/dL 9 12 12   Creatinine 0.44 - 1.00 mg/dL <0.30(L) <0.30(L) <0.30(L)  Sodium 135 - 145 mmol/L 140 142 142  Potassium 3.5 - 5.1 mmol/L 3.3(L) 3.7 4.2  Chloride 98 - 111 mmol/L 102 105 105  CO2 22 - 32 mmol/L 28 25 28   Calcium 8.9 - 10.3 mg/dL 7.8(L) 8.0(L) 8.2(L)    Oral supplements added to support overall nutrition intake.  Consult RD if additional follow up desired.   Colman Cater MS,RD,CSG,LDN Office: [phone] Pager: [phone]

## 2018-09-23 NOTE — Clinical Social Work Placement (Signed)
   CLINICAL SOCIAL WORK PLACEMENT  NOTE  Date:  09/23/2018  Patient Details  Name: Theresa Vincent MRN: [id number] Date of Birth: 04/05/1951  Clinical Social Work is seeking post-discharge placement for this patient at the [place] level of care (*CSW will initial, date and re-position this form in  chart as items are completed):  Yes   Patient/family provided with [facility] Work Department's list of facilities offering this level of care within the geographic area requested by the patient (or if unable, by the patient's family).  Yes   Patient/family informed of their freedom to choose among providers that offer the needed level of care, that participate in Medicare, Medicaid or managed care program needed by the patient, have an available bed and are willing to accept the patient.  Yes   Patient/family informed of [facility]'s ownership interest in [facility] and [facility], as well as of the fact that they are under no obligation to receive care at these facilities.  PASRR submitted to EDS on 09/18/18     PASRR number received on 09/18/18     Existing PASRR number confirmed on       FL2 transmitted to all facilities in geographic area requested by pt/family on 09/18/18     FL2 transmitted to all facilities within larger geographic area on 09/19/18     Patient informed that his/her managed care company has contracts with or will negotiate with certain facilities, including the following:        Yes   Patient/family informed of bed offers received.  Patient chooses bed at [facility]     Physician recommends and patient chooses bed at      Patient to be transferred to [facility] on 09/23/18.  Patient to be transferred to facility by [facility]     Patient family notified on 09/23/18 of transfer.  Name of family member notified:  Sisters at bedside are aware. Patient's daughter's number lister on chart was not a working  number      PHYSICIAN       Additional Comment:  Discharge clinicals sent to facility. LCSW signing off.   _______________________________________________ Ihor Gully, LCSW 09/23/2018, 3:39 PM

## 2018-09-23 NOTE — Progress Notes (Signed)
Report called to Angie at [facility]

## 2018-09-23 NOTE — Discharge Summary (Signed)
Physician Discharge Summary  Theresa Vincent JOA:[id number] DOB: 1951/02/09 DOA: 09/15/2018  PCP: Sharilyn Sites, MD  Admit date: 09/15/2018  Discharge date: 09/23/2018  Admitted From:Home  Disposition:  [facility]  Recommendations for Outpatient Follow-up:  1. Follow up with PCP in 1-2 weeks  Home Health:N/A  Equipment/Devices:None  Discharge Condition:Stable  CODE STATUS: DNR  Diet recommendation: Heart Healthy  Brief/Interim Summary:  67 year old female admitted from home on September 15, 2018 due to weakness dizziness and nausea.  She has a history of lung cancer with metastasis to the brain.  She has completed 10 rounds of radiation therapy she is currently on chemotherapy.  She was admitted for pneumonia with sepsis and respiratory failure.  She was treated with cefepime now for approximately 8 days and has had great improvements in her symptoms.  She will continue her outpatient oncology treatment for her non-small cell lung cancer with metastatic lesions as scheduled.  She will require placement to skilled nursing facility and will go there for rehabilitation.  Continue steroid taper for COPD exacerbation for 5 more days as well as PPI.  She did have a drug reaction with some allergy thought to be related to her antibiotics which have now been discontinued as she has received her full course.  Discharge Diagnoses:  Principal Problem:   Acute on chronic respiratory failure with hypoxia (HCC) Active Problems:   Hypothyroidism following radioiodine therapy   Non-small cell lung cancer, right ([place])   Brain metastases ([place])   Sepsis ([place])   HCAP (healthcare-associated pneumonia)   COPD (chronic obstructive pulmonary disease) ([place])    Discharge Instructions  Discharge Instructions    Diet - low sodium heart healthy   Complete by:  As directed    Increase activity slowly   Complete by:  As directed      Allergies as of 09/23/2018      Reactions   Avelox  [moxifloxacin Hcl In Nacl] Itching      Medication List    TAKE these medications   acetaminophen 500 MG tablet Commonly known as:  TYLENOL Take 500 mg by mouth daily as needed for moderate pain or headache.   ALIMTA IV Inject into the vein every 21 ( twenty-one) days.   dexamethasone 4 MG tablet Commonly known as:  DECADRON Take 1 tab two times a day the day before Alimta chemo. Take 2 tabs the day after chemo, then take 2 tabs two times a day for 2 days.   feeding supplement (ENSURE ENLIVE) Liqd Take 237 mLs by mouth 2 (two) times daily between meals.   folic acid 1 MG tablet Commonly known as:  FOLVITE Take 1 mg by mouth daily.   guaiFENesin-dextromethorphan 100-10 MG/5ML syrup Commonly known as:  ROBITUSSIN DM Take 5 mLs by mouth every 4 (four) hours as needed for cough (chest congestion).   HYDROcodone-Acetaminophen 5-300 MG Tabs Take 1 tablet by mouth every 4 (four) hours as needed for up to 3 days (pain).   hydrocortisone valerate cream 0.2 % Commonly known as:  WESTCORT Apply topically 2 (two) times daily.   ibuprofen 200 MG tablet Commonly known as:  ADVIL,MOTRIN Take 400 mg by mouth every 6 (six) hours as needed (pain / headache).   levothyroxine 25 MCG tablet Commonly known as:  SYNTHROID, LEVOTHROID TAKE 1 TABLET (25 MCG TOTAL) BY MOUTH DAILY BEFORE BREAKFAST.   lidocaine-prilocaine cream Commonly known as:  EMLA Apply to affected area once What changed:    how much to take  how to take this  when to take this  reasons to take this   Misc. Devices Misc Please supply one wheelchair for patient.   multivitamin with minerals Tabs tablet Take 1 tablet by mouth daily.   ondansetron 8 MG tablet Commonly known as:  ZOFRAN Take 1 tablet (8 mg total) by mouth 2 (two) times daily as needed (Nausea or vomiting).   pantoprazole 40 MG tablet Commonly known as:  PROTONIX Take 1 tablet (40 mg total) by mouth daily at 6 (six) AM. Start taking on:   09/24/2018   predniSONE 20 MG tablet Commonly known as:  DELTASONE Take 2 tablets (40 mg total) by mouth daily for 5 days.   prochlorperazine 10 MG tablet Commonly known as:  COMPAZINE Take 1 tablet (10 mg total) by mouth every 6 (six) hours as needed (Nausea or vomiting).   promethazine 12.5 MG tablet Commonly known as:  PHENERGAN Take 1 tablet (12.5 mg total) by mouth every 6 (six) hours as needed for nausea or vomiting.   promethazine 12.5 MG suppository Commonly known as:  PHENERGAN Place 1 suppository (12.5 mg total) rectally every 6 (six) hours as needed for nausea or vomiting.   pseudoephedrine-acetaminophen 30-500 MG Tabs tablet Commonly known as:  TYLENOL SINUS Take 1 tablet by mouth daily as needed (allergies).      Follow-up Information    Sharilyn Sites, MD Follow up in 1 week(s).   Specialty:  Family Medicine Contact information: [place] [address] [phone]          Allergies  Allergen Reactions  . Avelox [Moxifloxacin Hcl In Nacl] Itching    Consultations:  None   Procedures/Studies: Dg Chest 2 View  Result Date: 09/15/2018 CLINICAL DATA:  Fever, vomiting EXAM: CHEST - 2 VIEW COMPARISON:  09/14/2018 FINDINGS: Right Port-A-Cath remains in place, unchanged. Heart is normal size. Interstitial and airspace opacities in the left upper lobe concerning for pneumonia. No confluent opacity on the right. No effusions or acute bony abnormality. IMPRESSION: Interstitial and airspace opacities in the left upper lobe concerning for pneumonia. Electronically Signed   By: Rolm Baptise M.D.   On: 09/15/2018 19:33   Ct Head W Or Wo Contrast  Result Date: 09/16/2018 CLINICAL DATA:  67 y/o F; history of lung cancer with brain metastasis. Recurrent nausea and vomiting. EXAM: CT HEAD WITHOUT AND WITH CONTRAST TECHNIQUE: Contiguous axial images were obtained from the base of the skull through the vertex without and with intravenous contrast  CONTRAST:  64mL ISOVUE-300 IOPAMIDOL (ISOVUE-300) INJECTION 61% COMPARISON:  07/24/2018 MRI of the head. FINDINGS: Brain: Numerous enhancing brain metastasis peripheral increased in size when compared with the prior MRI of the brain. For example a lesion within the left medial cerebellum measures 14 mm, previously 25 mm (series 3, image 12) and a lesion in the right occipital lobe measures 9 mm, previously 14 mm (series 3, image 19). No interval hemorrhage or stroke. The larger lesions are associated with surrounding white matter hypodensity compatible with vasogenic edema which is mildly decreased when compared with the prior MRI of the brain. No extra-axial collection, hydrocephalus, or herniation. Vascular: No hyperdense vessel or unexpected calcification. Visible vessels are patent. Skull: Normal. Negative for fracture or focal lesion. Sinuses/Orbits: Right frontal, ethmoid, and maxillary sinus partial opacification. Additional paranasal sinuses and the mastoid air cells are normally aerated. Orbits are unremarkable. Other: None. IMPRESSION: 1. Numerous enhancing brain metastasis are overall decreased in size when compared with the prior MRI of  the brain. Associated vasogenic edema is also mildly decreased given differences in technique. No new metastasis identified. 2. No new acute intracranial abnormality identified. Electronically Signed   By: Kristine Garbe M.D.   On: 09/16/2018 02:37   Dg Chest Port 1 View  Result Date: 09/20/2018 CLINICAL DATA:  Shortness of breath EXAM: PORTABLE CHEST 1 VIEW COMPARISON:  09/15/2018 FINDINGS: Cardiac shadow is stable. Right chest wall port is again seen. There is significant increased vascular congestion and patchy densities identified throughout both lungs consistent with edema. No sizable effusion is noted at this time. No bony abnormality is seen. IMPRESSION: Increased central vascular congestion and patchy infiltrative changes likely related to edema  given the abrupt onset. Electronically Signed   By: Inez Catalina M.D.   On: 09/20/2018 15:06   Dg Chest Port 1 View  Result Date: 09/14/2018 CLINICAL DATA:  Nausea, vomiting weakness right non-small-cell lung carcinoma EXAM: PORTABLE CHEST 1 VIEW COMPARISON:  07/04/2018 FINDINGS: New right IJ port catheter tip SVC RA junction. Known medial right upper lobe lung mass by PET-CT is superimposed over the right hilum and not clearly defined by plain radiography. Stable heart size and vascularity. Mild diffuse interstitial prominence, nonspecific. No large effusion or pneumothorax. Trachea is midline. No acute osseous finding. IMPRESSION: Stable chest exam without superimposed acute process. Electronically Signed   By: Jerilynn Mages.  Shick M.D.   On: 09/14/2018 11:20   Dg Abd 2 Views  Result Date: 09/15/2018 CLINICAL DATA:  Nausea, vomiting EXAM: ABDOMEN - 2 VIEW COMPARISON:  CT 07/04/2018 FINDINGS: Nonobstructive bowel gas pattern. No free air or organomegaly. No suspicious calcification. Airspace opacities again noted in the left lung as seen on chest x-ray. IMPRESSION: No evidence of bowel obstruction or free air. Electronically Signed   By: Rolm Baptise M.D.   On: 09/15/2018 19:34   Ir Imaging Guided Port Insertion  Result Date: 09/11/2018 CLINICAL DATA:  chemotherapy administration, right non-small-cell lung carcinoma EXAM: TUNNELED PORT CATHETER PLACEMENT WITH ULTRASOUND AND FLUOROSCOPIC GUIDANCE FLUOROSCOPY TIME:  6 seconds; 1 mGy ANESTHESIA/SEDATION: Intravenous Fentanyl and Versed were administered as conscious sedation during continuous monitoring of the patient's level of consciousness and physiological / cardiorespiratory status by the radiology RN, with a total moderate sedation time of 18 minutes. TECHNIQUE: The procedure, risks, benefits, and alternatives were explained to the patient. Questions regarding the procedure were encouraged and answered. The patient understands and consents to the procedure. As  antibiotic prophylaxis, cefazolin 2 g was ordered pre-procedure and administered intravenously within one hour of incision. Patency of the right IJ vein was confirmed with ultrasound with image documentation. An appropriate skin site was determined. Skin site was marked. Region was prepped using maximum barrier technique including cap and mask, sterile gown, sterile gloves, large sterile sheet, and Chlorhexidine as cutaneous antisepsis. The region was infiltrated locally with 1% lidocaine. Under real-time ultrasound guidance, the right IJ vein was accessed with a 21 gauge micropuncture needle; the needle tip within the vein was confirmed with ultrasound image documentation. Needle was exchanged over a 018 guidewire for transitional dilator which allowed passage of the [facility] wire into the IVC. Over this, the transitional dilator was exchanged for a 5 [place] MPA catheter. A small incision was made on the right anterior chest wall and a subcutaneous pocket fashioned. The power-injectable port was positioned and its catheter tunneled to the right IJ dermatotomy site. The MPA catheter was exchanged over an Amplatz wire for a peel-away sheath, through which the port catheter, which had  been trimmed to the appropriate length, was advanced and positioned under fluoroscopy with its tip at the cavoatrial junction. Spot chest radiograph confirms good catheter position and no pneumothorax. The pocket was closed with deep interrupted and subcuticular continuous 3-0 Monocryl sutures. The port was flushed per protocol. The incisions were covered with Dermabond then covered with a sterile dressing. COMPLICATIONS: COMPLICATIONS None immediate IMPRESSION: Technically successful right IJ power-injectable port catheter placement. Ready for routine use. Electronically Signed   By: Lucrezia Europe M.D.   On: 09/11/2018 16:46   Discharge Exam: Vitals:   09/23/18 0804 09/23/18 1403  BP:    Pulse:    Resp:    Temp:    SpO2: 95% 96%    Vitals:   09/22/18 2247 09/23/18 0534 09/23/18 0804 09/23/18 1403  BP: 119/69 105/67    Pulse: (!) 101 80    Resp: 14 14    Temp: 98.2 F (36.8 C) 97.7 F (36.5 C)    TempSrc: Oral Oral    SpO2: 90% 95% 95% 96%  Weight:  71.4 kg    Height:        General: Pt is alert, awake, not in acute distress Cardiovascular: RRR, S1/S2 +, no rubs, no gallops Respiratory: CTA bilaterally, no wheezing, no rhonchi Abdominal: Soft, NT, ND, bowel sounds + Extremities: no edema, no cyanosis    The results of significant diagnostics from this hospitalization (including imaging, microbiology, ancillary and laboratory) are listed below for reference.     Microbiology: Recent Results (from the past 240 hour(s))  Urine Culture     Status: None   Collection Time: 09/14/18 11:07 AM  Result Value Ref Range Status   Specimen Description   Final    URINE, CLEAN CATCH Performed at [facility], [address] [place], [place] [address]    Special Requests   Final    NONE Performed at [facility], [address], [place], [place] [address]    Culture   Final    NO GROWTH Performed at [facility], [place] [address] [place], [place] [address]    Report Status 09/15/2018 FINAL  Final  Blood Culture (routine x 2)     Status: None   Collection Time: 09/15/18  7:41 PM  Result Value Ref Range Status   Specimen Description PORTA CATH DRAWN BY RN  Final   Special Requests   Final    BOTTLES DRAWN AEROBIC AND ANAEROBIC Blood Culture adequate volume   Culture   Final    NO GROWTH 5 DAYS Performed at [facility], [address] [place], [place] [address]    Report Status 09/20/2018 FINAL  Final  Blood Culture (routine x 2)     Status: None   Collection Time: 09/15/18  7:55 PM  Result Value Ref Range Status   Specimen Description BLOOD LEFT HAND  Final   Special Requests   Final    BOTTLES DRAWN AEROBIC AND ANAEROBIC Blood Culture adequate volume   Culture   Final    NO GROWTH 5  DAYS Performed at [facility], [address], [place], [place] [address]    Report Status 09/20/2018 FINAL  Final  MRSA PCR Screening     Status: None   Collection Time: 09/15/18 10:02 PM  Result Value Ref Range Status   MRSA by PCR NEGATIVE NEGATIVE Final    Comment:        The GeneXpert MRSA Assay (FDA approved for NASAL specimens only), is one component  of a comprehensive MRSA colonization surveillance program. It is not intended to diagnose MRSA infection nor to guide or monitor treatment for MRSA infections. Performed at [facility], [address], [place], [place] [address]   Urine culture     Status: None   Collection Time: 09/15/18 10:29 PM  Result Value Ref Range Status   Specimen Description   Final    URINE, CLEAN CATCH Performed at [facility], [address] [place], [place] [address]    Special Requests   Final    NONE Performed at [facility], [address] [place], [place] [address]    Culture   Final    Multiple bacterial morphotypes present, none predominant. Suggest appropriate recollection if clinically indicated.   Report Status 09/17/2018 FINAL  Final  C difficile quick scan w PCR reflex     Status: None   Collection Time: 09/18/18  1:28 AM  Result Value Ref Range Status   C Diff antigen NEGATIVE NEGATIVE Final   C Diff toxin NEGATIVE NEGATIVE Final   C Diff interpretation No C. difficile detected.  Final    Comment: Performed at [facility], [address], [place], [place] [address]  Culture, blood (routine x 2)     Status: None   Collection Time: 09/18/18  4:23 PM  Result Value Ref Range Status   Specimen Description BLOOD LEFT HAND  Final   Special Requests   Final    BOTTLES DRAWN AEROBIC AND ANAEROBIC Blood Culture adequate volume   Culture   Final    NO GROWTH 5 DAYS Performed at [facility], [address] [place], [place] [address]    Report Status 09/23/2018 FINAL  Final  Culture, blood (routine x 2)     Status: None    Collection Time: 09/18/18  4:35 PM  Result Value Ref Range Status   Specimen Description BLOOD LEFT WRIST  Final   Special Requests   Final    BOTTLES DRAWN AEROBIC AND ANAEROBIC Blood Culture adequate volume   Culture   Final    NO GROWTH 5 DAYS Performed at [facility], [address], [place], [place] [address]    Report Status 09/23/2018 FINAL  Final  Respiratory Panel by PCR     Status: None   Collection Time: 09/19/18  7:27 AM  Result Value Ref Range Status   Adenovirus NOT DETECTED NOT DETECTED Final   Coronavirus 229E NOT DETECTED NOT DETECTED Final   Coronavirus HKU1 NOT DETECTED NOT DETECTED Final   Coronavirus NL63 NOT DETECTED NOT DETECTED Final   Coronavirus OC43 NOT DETECTED NOT DETECTED Final   Metapneumovirus NOT DETECTED NOT DETECTED Final   Rhinovirus / Enterovirus NOT DETECTED NOT DETECTED Final   Influenza A NOT DETECTED NOT DETECTED Final   Influenza B NOT DETECTED NOT DETECTED Final   Parainfluenza Virus 1 NOT DETECTED NOT DETECTED Final   Parainfluenza Virus 2 NOT DETECTED NOT DETECTED Final   Parainfluenza Virus 3 NOT DETECTED NOT DETECTED Final   Parainfluenza Virus 4 NOT DETECTED NOT DETECTED Final   Respiratory Syncytial Virus NOT DETECTED NOT DETECTED Final   Bordetella pertussis NOT DETECTED NOT DETECTED Final   Chlamydophila pneumoniae NOT DETECTED NOT DETECTED Final   Mycoplasma pneumoniae NOT DETECTED NOT DETECTED Final    Comment: Performed at [facility], [place] [address] [place], [place] [address]     Labs: BNP (last 3 results) No results for input(s): BNP in the last 8760 hours. Basic Metabolic  Panel: Recent Labs  Lab 09/17/18 0959  09/19/18 0525 09/20/18 0547 09/20/18 0844 09/20/18 1557 09/21/18 0559 09/22/18 0521 09/23/18 0612  NA  --    < > 138 138  --  138 142 142 140  K  --    < > 3.5 2.9*  --  3.1* 4.2 3.7 3.3*  CL  --    < > 106 104  --  103 105 105 102  CO2  --    < > 24 26  --  26 28 25 28   GLUCOSE  --    < > 110*  142*  --  170* 146* 144* 135*  BUN  --    < > 7* 9  --  10 12 12 9   CREATININE  --    < > 0.33* <0.30*  --  0.39* <0.30* <0.30* <0.30*  CALCIUM  --    < > 7.6* 7.7*  --  8.0* 8.2* 8.0* 7.8*  MG 1.6*  --  1.9  --  1.9  --   --   --   --    < > = values in this interval not displayed.   Liver Function Tests: Recent Labs  Lab 09/23/18 0612  AST 233*  ALT 102*  ALKPHOS 160*  BILITOT 0.5  PROT 5.2*  ALBUMIN 1.9*   No results for input(s): LIPASE, AMYLASE in the last 168 hours. No results for input(s): AMMONIA in the last 168 hours. CBC: Recent Labs  Lab 09/19/18 0525 09/20/18 0547 09/21/18 0559 09/22/18 0521 09/23/18 0612  WBC 8.3 7.5 8.0 8.0 10.0  NEUTROABS 7.7 7.1 7.4 7.4 9.0*  HGB 8.9* 8.4* 8.1* 8.1* 8.8*  HCT 26.8* 25.1* 25.1* 24.5* 27.2*  MCV 87.3 86.3 88.1 89.1 89.2  PLT 350 369 419* 433* 467*   Cardiac Enzymes: No results for input(s): CKTOTAL, CKMB, CKMBINDEX, TROPONINI in the last 168 hours. BNP: Invalid input(s): POCBNP CBG: No results for input(s): GLUCAP in the last 168 hours. D-Dimer No results for input(s): DDIMER in the last 72 hours. Hgb A1c No results for input(s): HGBA1C in the last 72 hours. Lipid Profile No results for input(s): CHOL, HDL, LDLCALC, TRIG, CHOLHDL, LDLDIRECT in the last 72 hours. Thyroid function studies No results for input(s): TSH, T4TOTAL, T3FREE, THYROIDAB in the last 72 hours.  Invalid input(s): FREET3 Anemia work up No results for input(s): VITAMINB12, FOLATE, FERRITIN, TIBC, IRON, RETICCTPCT in the last 72 hours. Urinalysis    Component Value Date/Time   COLORURINE YELLOW 09/15/2018 2229   APPEARANCEUR CLEAR 09/15/2018 2229   LABSPEC 1.005 09/15/2018 2229   PHURINE 7.0 09/15/2018 2229   GLUCOSEU NEGATIVE 09/15/2018 2229   HGBUR NEGATIVE 09/15/2018 2229   BILIRUBINUR NEGATIVE 09/15/2018 2229   KETONESUR NEGATIVE 09/15/2018 2229   PROTEINUR NEGATIVE 09/15/2018 2229   NITRITE NEGATIVE 09/15/2018 2229   LEUKOCYTESUR  NEGATIVE 09/15/2018 2229   Sepsis Labs Invalid input(s): PROCALCITONIN,  WBC,  LACTICIDVEN Microbiology Recent Results (from the past 240 hour(s))  Urine Culture     Status: None   Collection Time: 09/14/18 11:07 AM  Result Value Ref Range Status   Specimen Description   Final    URINE, CLEAN CATCH Performed at [facility], [address], [place], [place] [address]    Special Requests   Final    NONE Performed at [facility], [address], [place], [place] [address]    Culture   Final    NO GROWTH Performed at [facility],  [address] [place], [place] [address]    Report Status 09/15/2018 FINAL  Final  Blood Culture (routine x 2)     Status: None   Collection Time: 09/15/18  7:41 PM  Result Value Ref Range Status   Specimen Description PORTA CATH DRAWN BY RN  Final   Special Requests   Final    BOTTLES DRAWN AEROBIC AND ANAEROBIC Blood Culture adequate volume   Culture   Final    NO GROWTH 5 DAYS Performed at [facility], [address], [place], [place] [address]    Report Status 09/20/2018 FINAL  Final  Blood Culture (routine x 2)     Status: None   Collection Time: 09/15/18  7:55 PM  Result Value Ref Range Status   Specimen Description BLOOD LEFT HAND  Final   Special Requests   Final    BOTTLES DRAWN AEROBIC AND ANAEROBIC Blood Culture adequate volume   Culture   Final    NO GROWTH 5 DAYS Performed at [facility], [address] [place], [place] [address]    Report Status 09/20/2018 FINAL  Final  MRSA PCR Screening     Status: None   Collection Time: 09/15/18 10:02 PM  Result Value Ref Range Status   MRSA by PCR NEGATIVE NEGATIVE Final    Comment:        The GeneXpert MRSA Assay (FDA approved for NASAL specimens only), is one component of a comprehensive MRSA colonization surveillance program. It is not intended to diagnose MRSA infection nor to guide or monitor treatment for MRSA infections. Performed at [facility], [address],  [place], [place] [address]   Urine culture     Status: None   Collection Time: 09/15/18 10:29 PM  Result Value Ref Range Status   Specimen Description   Final    URINE, CLEAN CATCH Performed at [facility], [address], [place], [place] [address]    Special Requests   Final    NONE Performed at [facility], [address], [place], [place] [address]    Culture   Final    Multiple bacterial morphotypes present, none predominant. Suggest appropriate recollection if clinically indicated.   Report Status 09/17/2018 FINAL  Final  C difficile quick scan w PCR reflex     Status: None   Collection Time: 09/18/18  1:28 AM  Result Value Ref Range Status   C Diff antigen NEGATIVE NEGATIVE Final   C Diff toxin NEGATIVE NEGATIVE Final   C Diff interpretation No C. difficile detected.  Final    Comment: Performed at [facility], [address], [place], [place] [address]  Culture, blood (routine x 2)     Status: None   Collection Time: 09/18/18  4:23 PM  Result Value Ref Range Status   Specimen Description BLOOD LEFT HAND  Final   Special Requests   Final    BOTTLES DRAWN AEROBIC AND ANAEROBIC Blood Culture adequate volume   Culture   Final    NO GROWTH 5 DAYS Performed at [facility], [address], [place], [place] [address]    Report Status 09/23/2018 FINAL  Final  Culture, blood (routine x 2)     Status: None   Collection Time: 09/18/18  4:35 PM  Result Value Ref Range Status   Specimen Description BLOOD LEFT WRIST  Final   Special Requests   Final    BOTTLES DRAWN AEROBIC AND ANAEROBIC Blood Culture adequate volume   Culture   Final  NO GROWTH 5 DAYS Performed at [facility], [address] [place], [place] [address]    Report Status 09/23/2018 FINAL  Final  Respiratory Panel by PCR     Status: None   Collection Time: 09/19/18  7:27 AM  Result Value Ref Range Status   Adenovirus NOT DETECTED NOT DETECTED Final   Coronavirus 229E NOT DETECTED NOT DETECTED Final   Coronavirus  HKU1 NOT DETECTED NOT DETECTED Final   Coronavirus NL63 NOT DETECTED NOT DETECTED Final   Coronavirus OC43 NOT DETECTED NOT DETECTED Final   Metapneumovirus NOT DETECTED NOT DETECTED Final   Rhinovirus / Enterovirus NOT DETECTED NOT DETECTED Final   Influenza A NOT DETECTED NOT DETECTED Final   Influenza B NOT DETECTED NOT DETECTED Final   Parainfluenza Virus 1 NOT DETECTED NOT DETECTED Final   Parainfluenza Virus 2 NOT DETECTED NOT DETECTED Final   Parainfluenza Virus 3 NOT DETECTED NOT DETECTED Final   Parainfluenza Virus 4 NOT DETECTED NOT DETECTED Final   Respiratory Syncytial Virus NOT DETECTED NOT DETECTED Final   Bordetella pertussis NOT DETECTED NOT DETECTED Final   Chlamydophila pneumoniae NOT DETECTED NOT DETECTED Final   Mycoplasma pneumoniae NOT DETECTED NOT DETECTED Final    Comment: Performed at [facility], [place] [address] [place], [place] [address]     Time coordinating discharge: 35 minutes  SIGNED:   Rodena Goldmann, DO [facility] 09/23/2018, 2:21 PM Pager [phone]  If 7PM-7AM, please contact night-coverage [email or web address] Password [email or web address]

## 2018-09-23 NOTE — Care Management Important Message (Signed)
Important Message  Patient Details  Name: Theresa Vincent MRN: [id number] Date of Birth: 12-18-1951   Medicare Important Message Given:  Yes    Shelda Altes 09/23/2018, 11:46 AM

## 2018-09-24 ENCOUNTER — Inpatient Hospital Stay (HOSPITAL_COMMUNITY): Payer: Medicare Other

## 2018-09-24 ENCOUNTER — Encounter (HOSPITAL_COMMUNITY): Payer: Self-pay | Admitting: Hematology

## 2018-09-24 ENCOUNTER — Inpatient Hospital Stay (HOSPITAL_COMMUNITY): Payer: Medicare Other | Attending: Hematology | Admitting: Hematology

## 2018-09-24 ENCOUNTER — Other Ambulatory Visit: Payer: Self-pay

## 2018-09-24 VITALS — BP 131/77 | HR 105 | Temp 98.3°F | Resp 16

## 2018-09-24 DIAGNOSIS — R21 Rash and other nonspecific skin eruption: Secondary | ICD-10-CM | POA: Insufficient documentation

## 2018-09-24 DIAGNOSIS — C3491 Malignant neoplasm of unspecified part of right bronchus or lung: Secondary | ICD-10-CM

## 2018-09-24 DIAGNOSIS — Z9981 Dependence on supplemental oxygen: Secondary | ICD-10-CM | POA: Insufficient documentation

## 2018-09-24 DIAGNOSIS — C7931 Secondary malignant neoplasm of brain: Secondary | ICD-10-CM

## 2018-09-24 DIAGNOSIS — J449 Chronic obstructive pulmonary disease, unspecified: Secondary | ICD-10-CM | POA: Diagnosis not present

## 2018-09-24 DIAGNOSIS — Z803 Family history of malignant neoplasm of breast: Secondary | ICD-10-CM | POA: Diagnosis not present

## 2018-09-24 DIAGNOSIS — Z87891 Personal history of nicotine dependence: Secondary | ICD-10-CM | POA: Insufficient documentation

## 2018-09-24 DIAGNOSIS — Z5111 Encounter for antineoplastic chemotherapy: Secondary | ICD-10-CM | POA: Insufficient documentation

## 2018-09-24 LAB — CBC WITH DIFFERENTIAL/PLATELET
BASOS PCT: 2 %
Basophils Absolute: 0.2 10*3/uL — ABNORMAL HIGH (ref 0.0–0.1)
Eosinophils Absolute: 0 10*3/uL (ref 0.0–0.7)
Eosinophils Relative: 0 %
HCT: 31 % — ABNORMAL LOW (ref 36.0–46.0)
Hemoglobin: 10 g/dL — ABNORMAL LOW (ref 12.0–15.0)
LYMPHS ABS: 1.5 10*3/uL (ref 0.7–4.0)
Lymphocytes Relative: 11 %
MCH: 28.7 pg (ref 26.0–34.0)
MCHC: 32.3 g/dL (ref 30.0–36.0)
MCV: 88.8 fL (ref 78.0–100.0)
MONOS PCT: 0 %
Monocytes Absolute: 0 10*3/uL (ref 0.1–1.0)
NEUTROS ABS: 12.5 10*3/uL (ref 1.7–7.7)
Neutrophils Relative %: 87 %
PLATELETS: 510 10*3/uL — AB (ref 150–400)
RBC: 3.49 MIL/uL — ABNORMAL LOW (ref 3.87–5.11)
RDW: 16.9 % — ABNORMAL HIGH (ref 11.5–15.5)
WBC: 14.3 10*3/uL — ABNORMAL HIGH (ref 4.0–10.5)

## 2018-09-24 LAB — COMPREHENSIVE METABOLIC PANEL
ALT: 176 U/L — ABNORMAL HIGH (ref 0–44)
AST: 327 U/L — AB (ref 15–41)
Albumin: 2.1 g/dL — ABNORMAL LOW (ref 3.5–5.0)
Alkaline Phosphatase: 219 U/L — ABNORMAL HIGH (ref 38–126)
Anion gap: 10 (ref 5–15)
BUN: 7 mg/dL — ABNORMAL LOW (ref 8–23)
CHLORIDE: 102 mmol/L (ref 98–111)
CO2: 29 mmol/L (ref 22–32)
Calcium: 8.1 mg/dL — ABNORMAL LOW (ref 8.9–10.3)
GLUCOSE: 109 mg/dL — AB (ref 70–99)
Potassium: 3.1 mmol/L — ABNORMAL LOW (ref 3.5–5.1)
SODIUM: 141 mmol/L (ref 135–145)
Total Bilirubin: 0.5 mg/dL (ref 0.3–1.2)
Total Protein: 5.6 g/dL — ABNORMAL LOW (ref 6.5–8.1)

## 2018-09-24 NOTE — Patient Instructions (Signed)
[facility] at [facility] Discharge Instructions     Thank you for choosing [facility] at [facility] to provide your oncology and hematology care.  To afford each patient quality time with our provider, please arrive at least 15 minutes before your scheduled appointment time.   If you have a lab appointment with the Cancer Center please come in thru the  Main Entrance and check in at the main information desk  You need to re-schedule your appointment should you arrive 10 or more minutes late.  We strive to give you quality time with our providers, and arriving late affects you and other patients whose appointments are after yours.  Also, if you no show three or more times for appointments you may be dismissed from the clinic at the providers discretion.     Again, thank you for choosing [facility].  Our hope is that these requests will decrease the amount of time that you wait before being seen by our physicians.       _____________________________________________________________  Should you have questions after your visit to [facility], please contact our office at [phone] between the hours of 8:00 a.m. and 4:30 p.m.  Voicemails left after 4:00 p.m. will not be returned until the following business day.  For prescription refill requests, have your pharmacy contact our office and allow 72 hours.    Cancer Center Support Programs:   > Cancer Support Group  2nd Tuesday of the month 1pm-2pm, Journey Room    

## 2018-09-24 NOTE — Progress Notes (Signed)
[place], [place] [address]   CLINIC:  Medical Oncology/Hematology  PCP:  Sharilyn Sites, MD [address] [place] [address] [phone]   REASON FOR VISIT: Follow-up for non small cell lung cancer  CURRENT THERAPY: Pemetrexed (ALIMTA)  BRIEF ONCOLOGIC HISTORY:    Non-small cell lung cancer, right ([place])   07/16/2018 Initial Diagnosis    Non-small cell lung cancer, right ([place])    09/03/2018 -  Chemotherapy    The patient had PEMEtrexed (ALIMTA) 850 mg in sodium chloride 0.9 % 100 mL chemo infusion, 500 mg/m2 = 850 mg, Intravenous,  Once, 1 of 6 cycles Administration: 850 mg (09/05/2018)  for chemotherapy treatment.      Brain metastases ([place])   08/01/2018 Initial Diagnosis    Brain metastases ([place])    09/03/2018 -  Chemotherapy    The patient had PEMEtrexed (ALIMTA) 850 mg in sodium chloride 0.9 % 100 mL chemo infusion, 500 mg/m2 = 850 mg, Intravenous,  Once, 1 of 6 cycles Administration: 850 mg (09/05/2018)  for chemotherapy treatment.        INTERVAL HISTORY:  Ms. Theresa Vincent 67 y.o. female returns for routine follow-up for non small cell lung cancer. Patient is here today with her daughter. She was discharged from the hospital to the [facility] center. She was brought by stretcher today due to not having a wheelchair. She is very weak and unable to walk by herself at this time. Physical therapy will be working with her at the center. Her hearing is wrose due to the radiation to the brain she received. She has a rash on her arms and truck. She reports the rash started while she was in the hospital and could have been medication she received while she was there. She denies any headaches or vision changes. Her appetite and energy level are 50%. She drinks boost daily to help maintain her weight.     REVIEW OF SYSTEMS:  Review of Systems  Constitutional: Positive for fatigue.  HENT:   Positive for mouth sores.   Respiratory: Positive  for shortness of breath.   Cardiovascular: Positive for leg swelling.  Skin: Positive for itching and rash.  Neurological: Positive for dizziness.  Hematological: Bruises/bleeds easily.  All other systems reviewed and are negative.    PAST MEDICAL/SURGICAL HISTORY:  Past Medical History:  Diagnosis Date  . Arthritis    in back   . Cancer ([place])   . Diverticula of colon   . Diverticulosis   . Hemorrhoid   . Thyroid disease    was hyperthyroid had frozen and now on meds   Past Surgical History:  Procedure Laterality Date  . Broken toe screw in toe    . EYE SURGERY  03/20/16  . IR IMAGING GUIDED PORT INSERTION  09/11/2018  . pt had her bladder stretched in the past     Dr. Michela Pitcher   . radiation thyroid    . WRIST SURGERY Right      SOCIAL HISTORY:  Social History   Socioeconomic History  . Marital status: Legally Separated    Spouse name: Not on file  . Number of children: Not on file  . Years of education: hs   . Highest education level: Not on file  Occupational History    Employer: [employer]  . Financial resource strain: Not on file  . Food insecurity:    Worry: Not on file    Inability: Not on file  .  Transportation needs:    Medical: Not on file    Non-medical: Not on file  Tobacco Use  . Smoking status: Former Smoker    Packs/day: 0.00    Years: 45.00    Pack years: 0.00  . Smokeless tobacco: Never Used  Substance and Sexual Activity  . Alcohol use: Yes    Comment: rarely   . Drug use: No  . Sexual activity: Not Currently    Birth control/protection: Post-menopausal  Lifestyle  . Physical activity:    Days per week: Not on file    Minutes per session: Not on file  . Stress: Not on file  Relationships  . Social connections:    Talks on phone: Not on file    Gets together: Not on file    Attends religious service: Not on file    Active member of club or organization: Not on file    Attends meetings of clubs or  organizations: Not on file    Relationship status: Not on file  . Intimate partner violence:    Fear of current or ex partner: Not on file    Emotionally abused: Not on file    Physically abused: Not on file    Forced sexual activity: Not on file  Other Topics Concern  . Not on file  Social History Narrative  . Not on file    FAMILY HISTORY:  Family History  Problem Relation Age of Onset  . Diabetes Mother   . Heart disease Father   . Diabetes Father   . Diabetes Sister   . Diabetes Sister   . Cancer Sister        breast    CURRENT MEDICATIONS:  Outpatient Encounter Medications as of 09/24/2018  Medication Sig  . acetaminophen (TYLENOL) 500 MG tablet Take 500 mg by mouth daily as needed for moderate pain or headache.  . dexamethasone (DECADRON) 4 MG tablet Take 1 tab two times a day the day before Alimta chemo. Take 2 tabs the day after chemo, then take 2 tabs two times a day for 2 days.  . feeding supplement, ENSURE ENLIVE, (ENSURE ENLIVE) LIQD Take 237 mLs by mouth 2 (two) times daily between meals.  . folic acid (FOLVITE) 1 MG tablet Take 1 mg by mouth daily.  Marland Kitchen guaiFENesin-dextromethorphan (ROBITUSSIN DM) 100-10 MG/5ML syrup Take 5 mLs by mouth every 4 (four) hours as needed for cough (chest congestion).  Marland Kitchen HYDROcodone-Acetaminophen (VICODIN) 5-300 MG TABS Take 1 tablet by mouth every 4 (four) hours as needed for up to 3 days (pain).  . hydrocortisone valerate cream (WESTCORT) 0.2 % Apply topically 2 (two) times daily.  Marland Kitchen ibuprofen (ADVIL,MOTRIN) 200 MG tablet Take 400 mg by mouth every 6 (six) hours as needed (pain / headache).  Marland Kitchen levothyroxine (SYNTHROID, LEVOTHROID) 25 MCG tablet TAKE 1 TABLET (25 MCG TOTAL) BY MOUTH DAILY BEFORE BREAKFAST.  Marland Kitchen lidocaine-prilocaine (EMLA) cream Apply to affected area once (Patient taking differently: Apply 1 application topically daily as needed (local anesthesia). Apply to affected area once)  . Misc. Devices MISC Please supply one  wheelchair for patient.  . Multiple Vitamin (MULTIVITAMIN WITH MINERALS) TABS tablet Take 1 tablet by mouth daily.  . ondansetron (ZOFRAN) 8 MG tablet Take 1 tablet (8 mg total) by mouth 2 (two) times daily as needed (Nausea or vomiting).  . pantoprazole (PROTONIX) 40 MG tablet Take 1 tablet (40 mg total) by mouth daily at 6 (six) AM.  . PEMEtrexed Disodium (ALIMTA IV) Inject into  the vein every 21 ( twenty-one) days.  . predniSONE (DELTASONE) 20 MG tablet Take 2 tablets (40 mg total) by mouth daily for 5 days.  . prochlorperazine (COMPAZINE) 10 MG tablet Take 1 tablet (10 mg total) by mouth every 6 (six) hours as needed (Nausea or vomiting).  . promethazine (PHENERGAN) 12.5 MG suppository Place 1 suppository (12.5 mg total) rectally every 6 (six) hours as needed for nausea or vomiting.  . promethazine (PHENERGAN) 12.5 MG tablet Take 1 tablet (12.5 mg total) by mouth every 6 (six) hours as needed for nausea or vomiting.  . pseudoephedrine-acetaminophen (TYLENOL SINUS) 30-500 MG TABS tablet Take 1 tablet by mouth daily as needed (allergies).   No facility-administered encounter medications on file as of 09/24/2018.     ALLERGIES:  Allergies  Allergen Reactions  . Avelox [Moxifloxacin Hcl In Nacl] Itching     PHYSICAL EXAM:  ECOG Performance status: 1  Vitals:   09/24/18 1013  BP: 131/77  Pulse: (!) 105  Resp: 16  Temp: 98.3 F (36.8 C)  SpO2: 90%    Physical Exam  Constitutional: She is oriented to person, place, and time. She appears well-developed and well-nourished.  Abdominal: Soft.  Musculoskeletal: Normal range of motion.  Neurological: She is alert and oriented to person, place, and time.  Skin: Skin is warm and dry.  Psychiatric: She has a normal mood and affect. Her behavior is normal. Judgment and thought content normal.     LABORATORY DATA:  I have reviewed the labs as listed.  CBC    Component Value Date/Time   WBC 14.3 (H) 09/24/2018 0923   RBC 3.49 (L)  09/24/2018 0923   HGB 10.0 (L) 09/24/2018 0923   HCT 31.0 (L) 09/24/2018 0923   PLT 510 (H) 09/24/2018 0923   MCV 88.8 09/24/2018 0923   MCH 28.7 09/24/2018 0923   MCHC 32.3 09/24/2018 0923   RDW 16.9 (H) 09/24/2018 0923   LYMPHSABS 1.5 09/24/2018 0923   MONOABS 0.0 09/24/2018 0923   EOSABS 0.0 09/24/2018 0923   BASOSABS 0.2 (H) 09/24/2018 0923   CMP Latest Ref Rng & Units 09/24/2018 09/23/2018 09/22/2018  Glucose 70 - 99 mg/dL 109(H) 135(H) 144(H)  BUN 8 - 23 mg/dL 7(L) 9 12  Creatinine 0.44 - 1.00 mg/dL <0.30(L) <0.30(L) <0.30(L)  Sodium 135 - 145 mmol/L 141 140 142  Potassium 3.5 - 5.1 mmol/L 3.1(L) 3.3(L) 3.7  Chloride 98 - 111 mmol/L 102 102 105  CO2 22 - 32 mmol/L _0 Calcium 8.9 - 10.3 mg/dL 8.1(L) 7.8(L) 8.0(L)  Total Protein 6.5 - 8.1 g/dL 5.6(L) 5.2(L) -  Total Bilirubin 0.3 - 1.2 mg/dL 0.5 0.5 -  Alkaline Phos 38 - 126 U/L 219(H) 160(H) -  AST 15 - 41 U/L 327(H) 233(H) -  ALT 0 - 44 U/L 176(H) 102(H) -       DIAGNOSTIC IMAGING:  I reviewed CT scan of the head dated 09/16/2018 from her recent admission.     ASSESSMENT & PLAN:   Non-small cell lung cancer, right (HCC) 1.  Stage IVa (T1BN2M1B) poorly differentiated adenocarcinoma of the right lung with extensive brain metastasis: Foundation 1 testing shows MS-stable, TMB-intermediate and no other actionable mutations. - Presentation with the 2 to 65-monthhistory of right submandibular mass, seen by Dr. TBenjamine Mola  Flexible fiberoptic laryngoscopy showed normal examination. - She underwent right submandibular lymph node biopsy on 06/26/2018 which showed metastatic poorly differentiated adenocarcinoma, TTF-1 positive, CK positive. - Patient is a current active  smoker, quit 2 weeks ago, smoked about half pack per day for more than 25 years. -PET CT scan on 07/15/2018 shows hypermetabolic 1.8 cm right upper lobe pulmonary nodule, suspicious for primary bronchus and carcinoma, ipsilateral hypermetabolic mediastinal  adenopathy consistent with metastatic disease, 2.3 cm hypermetabolic right submandibular lymph node. - Patient reportedly fell 2 weeks ago and since then has been feeling dizzy and unsteady.  Also has occasional nausea and vomited for the last 2 weeks mostly after eating.  Taking Zofran which occasionally helps. -Brain MRI on 07/24/2018 showed extremely widespread metastatic disease to the brain, more than 40 minutes, largest 2.5 cm. - She received whole brain radiation therapy from 08/02/2018 through 08/15/2018, 30 Gray in 10 fractions. - She received single agent pemetrexed on 09/05/2018. - She was admitted to the hospital with pneumonia, sepsis and COPD exacerbation from 09/15/2018 through 09/23/2018. -She was discharged to rehab facility yesterday.  She arrived in a stretcher today.  She has not started any therapy yet.  I will hold off on her next cycle of chemotherapy later this week.  I will reevaluate her in 2 weeks.  In the meanwhile she will proceed with physical therapy.  She will continue folic acid daily. -She is very weak and requires a wheelchair for ambulation. -I will look for PDL 1 testing results.  2.  Family history: -She had one sister who had TNBC at 50.  Another sister had breast cancer at age 25.  Maternal aunt had breast cancer at age 87.  I have recommended her sister to undergo testing for BRCA 1/2 .      Orders placed this encounter:  Orders Placed This Encounter  Procedures  . Magnesium  . CBC with Differential/Platelet  . Comprehensive metabolic panel      Derek Jack, MD [place] [phone]

## 2018-09-24 NOTE — Assessment & Plan Note (Signed)
1.  Stage IVa (T1BN2M1B) poorly differentiated adenocarcinoma of the right lung with extensive brain metastasis: Foundation 1 testing shows MS-stable, TMB-intermediate and no other actionable mutations. - Presentation with the 2 to 8-monthhistory of right submandibular mass, seen by Dr. TBenjamine Mola  Flexible fiberoptic laryngoscopy showed normal examination. - Theresa Vincent underwent right submandibular lymph node biopsy on 06/26/2018 which showed metastatic poorly differentiated adenocarcinoma, TTF-1 positive, CK positive. - Patient is a current active smoker, quit 2 weeks ago, smoked about half pack per day for more than 25 years. -PET CT scan on 07/15/2018 shows hypermetabolic 1.8 cm right upper lobe pulmonary nodule, suspicious for primary bronchus and carcinoma, ipsilateral hypermetabolic mediastinal adenopathy consistent with metastatic disease, 2.3 cm hypermetabolic right submandibular lymph node. - Patient reportedly fell 2 weeks ago and since then has been feeling dizzy and unsteady.  Also has occasional nausea and vomited for the last 2 weeks mostly after eating.  Taking Zofran which occasionally helps. -Brain MRI on 07/24/2018 showed extremely widespread metastatic disease to the brain, more than 40 minutes, largest 2.5 cm. - Theresa Vincent received whole brain radiation therapy from 08/02/2018 through 08/15/2018, 30 Gray in 10 fractions. - Theresa Vincent received single agent pemetrexed on 09/05/2018. - Theresa Vincent was admitted to the hospital with pneumonia, sepsis and COPD exacerbation from 09/15/2018 through 09/23/2018. -Theresa Vincent was discharged to rehab facility yesterday.  Theresa Vincent arrived in a stretcher today.  Theresa Vincent has not started any therapy yet.  I will hold off on her next cycle of chemotherapy later this week.  I will reevaluate her in 2 weeks.  In the meanwhile Theresa Vincent will proceed with physical therapy.  Theresa Vincent will continue folic acid daily. -Theresa Vincent is very weak and requires a wheelchair for ambulation. -I will look for PDL 1 testing results.  2.   Family history: -Theresa Vincent had one sister who had TNBC at 477  Another sister had breast cancer at age 66  Maternal aunt had breast cancer at age 66  I have recommended her sister to undergo testing for BRCA 1/2 .

## 2018-09-25 DIAGNOSIS — J181 Lobar pneumonia, unspecified organism: Secondary | ICD-10-CM | POA: Diagnosis not present

## 2018-09-25 DIAGNOSIS — C7931 Secondary malignant neoplasm of brain: Secondary | ICD-10-CM | POA: Diagnosis not present

## 2018-09-25 DIAGNOSIS — J449 Chronic obstructive pulmonary disease, unspecified: Secondary | ICD-10-CM | POA: Diagnosis not present

## 2018-09-25 DIAGNOSIS — C3491 Malignant neoplasm of unspecified part of right bronchus or lung: Secondary | ICD-10-CM | POA: Diagnosis not present

## 2018-09-26 ENCOUNTER — Ambulatory Visit (HOSPITAL_COMMUNITY): Payer: Medicare Other

## 2018-09-27 DIAGNOSIS — J181 Lobar pneumonia, unspecified organism: Secondary | ICD-10-CM | POA: Diagnosis not present

## 2018-09-27 DIAGNOSIS — J9620 Acute and chronic respiratory failure, unspecified whether with hypoxia or hypercapnia: Secondary | ICD-10-CM | POA: Diagnosis not present

## 2018-09-27 DIAGNOSIS — E039 Hypothyroidism, unspecified: Secondary | ICD-10-CM | POA: Diagnosis not present

## 2018-09-27 DIAGNOSIS — C3491 Malignant neoplasm of unspecified part of right bronchus or lung: Secondary | ICD-10-CM | POA: Diagnosis not present

## 2018-09-30 ENCOUNTER — Other Ambulatory Visit (HOSPITAL_COMMUNITY): Payer: Self-pay | Admitting: Internal Medicine

## 2018-09-30 NOTE — Progress Notes (Signed)
Pt needs wheelchair due to decreased mobility due to cancer. Please facilitate supply of wheelchair for pt.  Please notify office if any additional information needed.

## 2018-10-02 DIAGNOSIS — C3491 Malignant neoplasm of unspecified part of right bronchus or lung: Secondary | ICD-10-CM | POA: Diagnosis not present

## 2018-10-02 DIAGNOSIS — C7931 Secondary malignant neoplasm of brain: Secondary | ICD-10-CM | POA: Diagnosis not present

## 2018-10-02 DIAGNOSIS — R609 Edema, unspecified: Secondary | ICD-10-CM | POA: Diagnosis not present

## 2018-10-02 DIAGNOSIS — J181 Lobar pneumonia, unspecified organism: Secondary | ICD-10-CM | POA: Diagnosis not present

## 2018-10-09 ENCOUNTER — Inpatient Hospital Stay (HOSPITAL_BASED_OUTPATIENT_CLINIC_OR_DEPARTMENT_OTHER): Payer: Medicare Other | Admitting: Oncology

## 2018-10-09 ENCOUNTER — Inpatient Hospital Stay (HOSPITAL_COMMUNITY): Payer: Medicare Other

## 2018-10-09 ENCOUNTER — Other Ambulatory Visit: Payer: Self-pay

## 2018-10-09 ENCOUNTER — Encounter (HOSPITAL_COMMUNITY): Payer: Self-pay

## 2018-10-09 ENCOUNTER — Encounter (HOSPITAL_COMMUNITY): Payer: Self-pay | Admitting: Oncology

## 2018-10-09 VITALS — Wt 136.9 lb

## 2018-10-09 VITALS — BP 111/70 | HR 96 | Temp 98.2°F | Resp 20

## 2018-10-09 DIAGNOSIS — R5383 Other fatigue: Secondary | ICD-10-CM | POA: Diagnosis not present

## 2018-10-09 DIAGNOSIS — R21 Rash and other nonspecific skin eruption: Secondary | ICD-10-CM | POA: Diagnosis not present

## 2018-10-09 DIAGNOSIS — J449 Chronic obstructive pulmonary disease, unspecified: Secondary | ICD-10-CM

## 2018-10-09 DIAGNOSIS — Z9981 Dependence on supplemental oxygen: Secondary | ICD-10-CM

## 2018-10-09 DIAGNOSIS — C7931 Secondary malignant neoplasm of brain: Secondary | ICD-10-CM | POA: Diagnosis not present

## 2018-10-09 DIAGNOSIS — Z7189 Other specified counseling: Secondary | ICD-10-CM

## 2018-10-09 DIAGNOSIS — Z5111 Encounter for antineoplastic chemotherapy: Secondary | ICD-10-CM

## 2018-10-09 DIAGNOSIS — K5903 Drug induced constipation: Secondary | ICD-10-CM

## 2018-10-09 DIAGNOSIS — K5900 Constipation, unspecified: Secondary | ICD-10-CM | POA: Diagnosis not present

## 2018-10-09 DIAGNOSIS — J181 Lobar pneumonia, unspecified organism: Secondary | ICD-10-CM | POA: Diagnosis not present

## 2018-10-09 DIAGNOSIS — Z87891 Personal history of nicotine dependence: Secondary | ICD-10-CM

## 2018-10-09 DIAGNOSIS — C3491 Malignant neoplasm of unspecified part of right bronchus or lung: Secondary | ICD-10-CM

## 2018-10-09 LAB — CBC WITH DIFFERENTIAL/PLATELET
ABS IMMATURE GRANULOCYTES: 0.36 10*3/uL — AB (ref 0.00–0.07)
BASOS ABS: 0.1 10*3/uL (ref 0.0–0.1)
Basophils Relative: 0 %
Eosinophils Absolute: 0.1 10*3/uL (ref 0.0–0.5)
Eosinophils Relative: 0 %
HEMATOCRIT: 33.4 % — AB (ref 36.0–46.0)
HEMOGLOBIN: 10.2 g/dL — AB (ref 12.0–15.0)
IMMATURE GRANULOCYTES: 3 %
LYMPHS ABS: 1 10*3/uL (ref 0.7–4.0)
LYMPHS PCT: 8 %
MCH: 27.9 pg (ref 26.0–34.0)
MCHC: 30.5 g/dL (ref 30.0–36.0)
MCV: 91.3 fL (ref 80.0–100.0)
Monocytes Absolute: 0.6 10*3/uL (ref 0.1–1.0)
Monocytes Relative: 4 %
NEUTROS ABS: 11.5 10*3/uL — AB (ref 1.7–7.7)
NEUTROS PCT: 85 %
NRBC: 0 % (ref 0.0–0.2)
Platelets: 442 10*3/uL — ABNORMAL HIGH (ref 150–400)
RBC: 3.66 MIL/uL — ABNORMAL LOW (ref 3.87–5.11)
RDW: 18.3 % — ABNORMAL HIGH (ref 11.5–15.5)
WBC: 13.5 10*3/uL — ABNORMAL HIGH (ref 4.0–10.5)

## 2018-10-09 LAB — COMPREHENSIVE METABOLIC PANEL
ALBUMIN: 2.6 g/dL — AB (ref 3.5–5.0)
ALK PHOS: 122 U/L (ref 38–126)
ALT: 22 U/L (ref 0–44)
AST: 27 U/L (ref 15–41)
Anion gap: 10 (ref 5–15)
BUN: 8 mg/dL (ref 8–23)
CO2: 29 mmol/L (ref 22–32)
Calcium: 8.8 mg/dL — ABNORMAL LOW (ref 8.9–10.3)
Chloride: 98 mmol/L (ref 98–111)
Creatinine, Ser: 0.46 mg/dL (ref 0.44–1.00)
GFR calc Af Amer: >60 mL/min (ref >60)
GFR calc non Af Amer: >60 mL/min (ref >60)
GLUCOSE: 193 mg/dL — AB (ref 70–99)
Potassium: 4.2 mmol/L (ref 3.5–5.1)
Sodium: 137 mmol/L (ref 135–145)
Total Bilirubin: 0.5 mg/dL (ref 0.3–1.2)
Total Protein: 6.3 g/dL — ABNORMAL LOW (ref 6.5–8.1)

## 2018-10-09 LAB — MAGNESIUM: Magnesium: 2.2 mg/dL (ref 1.7–2.4)

## 2018-10-09 MED ORDER — PROCHLORPERAZINE MALEATE 10 MG PO TABS
10.0000 mg | ORAL_TABLET | Freq: Once | ORAL | Status: AC
  Administered 2018-10-09: 10 mg via ORAL

## 2018-10-09 MED ORDER — SODIUM CHLORIDE 0.9 % IV SOLN
475.0000 mg/m2 | Freq: Once | INTRAVENOUS | Status: AC
  Administered 2018-10-09: 800 mg via INTRAVENOUS
  Filled 2018-10-09: qty 20

## 2018-10-09 MED ORDER — SODIUM CHLORIDE 0.9 % IV SOLN
Freq: Once | INTRAVENOUS | Status: AC
  Administered 2018-10-09: 11:00:00 via INTRAVENOUS

## 2018-10-09 MED ORDER — PROCHLORPERAZINE MALEATE 10 MG PO TABS
ORAL_TABLET | ORAL | Status: AC
  Filled 2018-10-09: qty 1

## 2018-10-09 MED ORDER — HEPARIN SOD (PORK) LOCK FLUSH 100 UNIT/ML IV SOLN
500.0000 [IU] | Freq: Once | INTRAVENOUS | Status: AC | PRN
  Administered 2018-10-09: 500 [IU]

## 2018-10-09 MED ORDER — SODIUM CHLORIDE 0.9% FLUSH
10.0000 mL | INTRAVENOUS | Status: DC | PRN
  Administered 2018-10-09: 10 mL
  Filled 2018-10-09: qty 10

## 2018-10-09 NOTE — Assessment & Plan Note (Addendum)
Stage IVA ([id number]) NSCLC, adenocarcinoma-type, biopsy-proven on right submandibular biopsy by Dr. Benjamine Mola on 06/26/2018 showing a poorly differentiated adenocarcinoma, TTF-1 +, CK +.  FoundationOne testing demonstrated MS-stable, TMB-intermediate (without any other actionable mutations). Staging PET scan on 07/15/2018 demonstrated a hypermetabolic 1.8 cm RUL pulmonary nodule, ipsilateral hypermetabolic mediastinal adenopathy, and 2.3 cm hypermetabolic right submandibular lymph node.  Baseline brain imaging on 07/24/2018 demonstrated extreme widespread metastatic disease to the brain, largest measuring 2.5 cm.  She is S/P whole brain XRT (08/02/2018- 08/15/2018).  She started single-agent Pemetrexed on 09/05/2018.  Labs today: CBC diff, CMET, Magnesium.  I personally reviewed and went over laboratory results with the patient.  The results are noted within this dictation.  Blood counts today demonstrate a white blood cell count of 13.5 with an ANC of 11.5, hemoglobin 10.2 g/dL, blood count 442,000.  Lab satisfy treatment parameters today.  Today is day 1 of cycle #2  Nursing, in accordance with chemotherapy administration protocol, will monitor for acute side effects/toxicities associated with chemotherapy administration today.  Due to her history of constipation, I have prescribed Senokot S2 tablets daily as a bowel regimen.    Return in 3 weeks for follow-up and ongoing treatment.

## 2018-10-09 NOTE — Patient Instructions (Signed)
[facility] Discharge Instructions for Patients Receiving Chemotherapy  Today you received the following chemotherapy agents   To help prevent nausea and vomiting after your treatment, we encourage you to take your nausea medication   If you develop nausea and vomiting that is not controlled by your nausea medication, call the clinic.   BELOW ARE SYMPTOMS THAT SHOULD BE REPORTED IMMEDIATELY:  *FEVER GREATER THAN 100.5 F  *CHILLS WITH OR WITHOUT FEVER  NAUSEA AND VOMITING THAT IS NOT CONTROLLED WITH YOUR NAUSEA MEDICATION  *UNUSUAL SHORTNESS OF BREATH  *UNUSUAL BRUISING OR BLEEDING  TENDERNESS IN MOUTH AND THROAT WITH OR WITHOUT PRESENCE OF ULCERS  *URINARY PROBLEMS  *BOWEL PROBLEMS  UNUSUAL RASH Items with * indicate a potential emergency and should be followed up as soon as possible.  Feel free to call the clinic should you have any questions or concerns. The clinic phone number is [phone].  Please show the CHEMO ALERT CARD at check-in to the Emergency Department and triage nurse.   

## 2018-10-09 NOTE — Progress Notes (Signed)
Pt presents to [facility] via wheel chair to proceed with treatment today. VSS. Labs wnl. Pt seen by PA / Kirby Crigler.   Treatment given today per MD orders. Tolerated infusion without adverse affects. Vital signs stable. No complaints at this time. Discharged from via wheel chair. F/U with [facility] as scheduled.

## 2018-10-09 NOTE — Progress Notes (Signed)
t       Sharilyn Sites, MD [address] [place] [address]  Non-small cell lung cancer, right [facility])  Brain metastases [facility])  Encounter for antineoplastic chemotherapy  Chronic obstructive pulmonary disease, unspecified COPD type ([place])  Goals of care, counseling/discussion  Drug-induced constipation   HISTORY OF PRESENT ILLNESS: Stage IVA ([id number]) NSCLC, adenocarcinoma-type, biopsy-proven on right submandibular biopsy by Dr. Benjamine Mola on 06/26/2018 showing a poorly differentiated adenocarcinoma, TTF-1 +, CK +.  FoundationOne testing demonstrated MS-stable, TMB-intermediate (without any other actionable mutations). Staging PET scan on 07/15/2018 demonstrated a hypermetabolic 1.8 cm RUL pulmonary nodule, ipsilateral hypermetabolic mediastinal adenopathy, and 2.3 cm hypermetabolic right submandibular lymph node.  Baseline brain imaging on 07/24/2018 demonstrated extreme widespread metastatic disease to the brain, largest measuring 2.5 cm.  She is S/P whole brain XRT (08/02/2018- 08/15/2018).  She started single-agent Pemetrexed on 09/05/2018.  CURRENT THERAPY: Single agent pemetrexed  CURRENT STATUS: Theresa Vincent 67 y.o. female returns for followup of stage IV adenocarcinoma of lung.  She is doing well today.  Her biggest complaint is fatigue and tiredness however this is exacerbated by being woken up this morning at 3 AM to do physical therapy prior to her appointment today.  As result she is a little grouchy today.  She is otherwise very pleasant.  She denies any new lumps or bumps on her own examination.  Her appetite is fair.  She denies any new pain.  She denies any new neurological deficits including headaches, dizziness, double vision, LOC, and seizure.  Review of Systems  Constitutional: Positive for chills and malaise/fatigue. Negative for fever and weight loss.  HENT: Negative.   Eyes: Negative.   Respiratory: Positive for shortness of breath (Stable). Negative for cough.     Cardiovascular: Negative.  Negative for chest pain.  Gastrointestinal: Negative.  Negative for blood in stool, constipation, diarrhea, melena, nausea and vomiting.  Genitourinary: Negative.   Musculoskeletal: Negative.   Skin: Negative.   Neurological: Positive for dizziness and weakness.  Endo/Heme/Allergies: Negative.   Psychiatric/Behavioral: Negative.     Past Medical History:  Diagnosis Date  . Arthritis    in back   . Cancer ([place])   . Diverticula of colon   . Diverticulosis   . Hemorrhoid   . Thyroid disease    was hyperthyroid had frozen and now on meds    Past Surgical History:  Procedure Laterality Date  . Broken toe screw in toe    . EYE SURGERY  03/20/16  . IR IMAGING GUIDED PORT INSERTION  09/11/2018  . pt had her bladder stretched in the past     Dr. Michela Pitcher   . radiation thyroid    . WRIST SURGERY Right     Family History  Problem Relation Age of Onset  . Diabetes Mother   . Heart disease Father   . Diabetes Father   . Diabetes Sister   . Diabetes Sister   . Cancer Sister        breast    Social History   Socioeconomic History  . Marital status: Legally Separated    Spouse name: Not on file  . Number of children: Not on file  . Years of education: hs   . Highest education level: Not on file  Occupational History    Employer: [employer]  . Financial resource strain: Not on file  . Food insecurity:    Worry: Not on file    Inability: Not on file  .  Transportation needs:    Medical: Not on file    Non-medical: Not on file  Tobacco Use  . Smoking status: Former Smoker    Packs/day: 0.00    Years: 45.00    Pack years: 0.00  . Smokeless tobacco: Never Used  Substance and Sexual Activity  . Alcohol use: Yes    Comment: rarely   . Drug use: No  . Sexual activity: Not Currently    Birth control/protection: Post-menopausal  Lifestyle  . Physical activity:    Days per week: Not on file    Minutes per session: Not on  file  . Stress: Not on file  Relationships  . Social connections:    Talks on phone: Not on file    Gets together: Not on file    Attends religious service: Not on file    Active member of club or organization: Not on file    Attends meetings of clubs or organizations: Not on file    Relationship status: Not on file  Other Topics Concern  . Not on file  Social History Narrative  . Not on file     PHYSICAL EXAMINATION  ECOG PERFORMANCE STATUS: 2 - Symptomatic, <50% confined to bed  Vitals:   10/09/18 1009  BP: 111/70  Pulse: 96  Resp: 20  Temp: 98.2 F (36.8 C)  SpO2: 100%    GENERAL:alert, no distress, comfortable, cooperative, ill looking, smiling and accompanied by 2 family members.  Nasal cannula in place for oxygen delivery SKIN: skin color, texture, turgor are normal HEAD: Normocephalic EYES: normal, PERRLA, EOMI EARS: External ears normal OROPHARYNX:lips, buccal mucosa, and tongue normal  NECK: supple LYMPH:  no palpable lymphadenopathy BREAST:not examined LUNGS: clear to auscultation  HEART: regular rate & rhythm ABDOMEN:abdomen soft, non-tender and normal bowel sounds BACK: Back symmetric, no curvature. EXTREMITIES:less then 2 second capillary refill, no joint deformities, effusion, or inflammation, no cyanosis  NEURO: alert & oriented x 3 with fluent speech, and wheelchair   LABORATORY DATA: CBC    Component Value Date/Time   WBC 13.5 (H) 10/09/2018 0923   RBC 3.66 (L) 10/09/2018 0923   HGB 10.2 (L) 10/09/2018 0923   HCT 33.4 (L) 10/09/2018 0923   PLT 442 (H) 10/09/2018 0923   MCV 91.3 10/09/2018 0923   MCH 27.9 10/09/2018 0923   MCHC 30.5 10/09/2018 0923   RDW 18.3 (H) 10/09/2018 0923   LYMPHSABS 1.0 10/09/2018 0923   MONOABS 0.6 10/09/2018 0923   EOSABS 0.1 10/09/2018 0923   BASOSABS 0.1 10/09/2018 0923      Chemistry      Component Value Date/Time   NA 137 10/09/2018 0923   K 4.2 10/09/2018 0923   CL 98 10/09/2018 0923   CO2 29  10/09/2018 0923   BUN 8 10/09/2018 0923   CREATININE 0.46 10/09/2018 0923      Component Value Date/Time   CALCIUM 8.8 (L) 10/09/2018 0923   ALKPHOS 122 10/09/2018 0923   AST 27 10/09/2018 0923   ALT 22 10/09/2018 0923   BILITOT 0.5 10/09/2018 0923       RADIOGRAPHIC STUDIES:  Dg Chest 2 View  Result Date: 09/15/2018 CLINICAL DATA:  Fever, vomiting EXAM: CHEST - 2 VIEW COMPARISON:  09/14/2018 FINDINGS: Right Port-A-Cath remains in place, unchanged. Heart is normal size. Interstitial and airspace opacities in the left upper lobe concerning for pneumonia. No confluent opacity on the right. No effusions or acute bony abnormality. IMPRESSION: Interstitial and airspace opacities in the left upper lobe  concerning for pneumonia. Electronically Signed   By: Rolm Baptise M.D.   On: 09/15/2018 19:33   Ct Head W Or Wo Contrast  Result Date: 09/16/2018 CLINICAL DATA:  67 y/o F; history of lung cancer with brain metastasis. Recurrent nausea and vomiting. EXAM: CT HEAD WITHOUT AND WITH CONTRAST TECHNIQUE: Contiguous axial images were obtained from the base of the skull through the vertex without and with intravenous contrast CONTRAST:  85m ISOVUE-300 IOPAMIDOL (ISOVUE-300) INJECTION 61% COMPARISON:  07/24/2018 MRI of the head. FINDINGS: Brain: Numerous enhancing brain metastasis peripheral increased in size when compared with the prior MRI of the brain. For example a lesion within the left medial cerebellum measures 14 mm, previously 25 mm (series 3, image 12) and a lesion in the right occipital lobe measures 9 mm, previously 14 mm (series 3, image 19). No interval hemorrhage or stroke. The larger lesions are associated with surrounding white matter hypodensity compatible with vasogenic edema which is mildly decreased when compared with the prior MRI of the brain. No extra-axial collection, hydrocephalus, or herniation. Vascular: No hyperdense vessel or unexpected calcification. Visible vessels are patent.  Skull: Normal. Negative for fracture or focal lesion. Sinuses/Orbits: Right frontal, ethmoid, and maxillary sinus partial opacification. Additional paranasal sinuses and the mastoid air cells are normally aerated. Orbits are unremarkable. Other: None. IMPRESSION: 1. Numerous enhancing brain metastasis are overall decreased in size when compared with the prior MRI of the brain. Associated vasogenic edema is also mildly decreased given differences in technique. No new metastasis identified. 2. No new acute intracranial abnormality identified. Electronically Signed   By: LKristine GarbeM.D.   On: 09/16/2018 02:37   Dg Chest Port 1 View  Result Date: 09/20/2018 CLINICAL DATA:  Shortness of breath EXAM: PORTABLE CHEST 1 VIEW COMPARISON:  09/15/2018 FINDINGS: Cardiac shadow is stable. Right chest wall port is again seen. There is significant increased vascular congestion and patchy densities identified throughout both lungs consistent with edema. No sizable effusion is noted at this time. No bony abnormality is seen. IMPRESSION: Increased central vascular congestion and patchy infiltrative changes likely related to edema given the abrupt onset. Electronically Signed   By: MInez CatalinaM.D.   On: 09/20/2018 15:06   Dg Chest Port 1 View  Result Date: 09/14/2018 CLINICAL DATA:  Nausea, vomiting weakness right non-small-cell lung carcinoma EXAM: PORTABLE CHEST 1 VIEW COMPARISON:  07/04/2018 FINDINGS: New right IJ port catheter tip SVC RA junction. Known medial right upper lobe lung mass by PET-CT is superimposed over the right hilum and not clearly defined by plain radiography. Stable heart size and vascularity. Mild diffuse interstitial prominence, nonspecific. No large effusion or pneumothorax. Trachea is midline. No acute osseous finding. IMPRESSION: Stable chest exam without superimposed acute process. Electronically Signed   By: MJerilynn Mages  Shick M.D.   On: 09/14/2018 11:20   Dg Abd 2 Views  Result Date:  09/15/2018 CLINICAL DATA:  Nausea, vomiting EXAM: ABDOMEN - 2 VIEW COMPARISON:  CT 07/04/2018 FINDINGS: Nonobstructive bowel gas pattern. No free air or organomegaly. No suspicious calcification. Airspace opacities again noted in the left lung as seen on chest x-ray. IMPRESSION: No evidence of bowel obstruction or free air. Electronically Signed   By: KRolm BaptiseM.D.   On: 09/15/2018 19:34   Ir Imaging Guided Port Insertion  Result Date: 09/11/2018 CLINICAL DATA:  chemotherapy administration, right non-small-cell lung carcinoma EXAM: TUNNELED PORT CATHETER PLACEMENT WITH ULTRASOUND AND FLUOROSCOPIC GUIDANCE FLUOROSCOPY TIME:  6 seconds; 1 mGy ANESTHESIA/SEDATION: Intravenous Fentanyl and  Versed were administered as conscious sedation during continuous monitoring of the patient's level of consciousness and physiological / cardiorespiratory status by the radiology RN, with a total moderate sedation time of 18 minutes. TECHNIQUE: The procedure, risks, benefits, and alternatives were explained to the patient. Questions regarding the procedure were encouraged and answered. The patient understands and consents to the procedure. As antibiotic prophylaxis, cefazolin 2 g was ordered pre-procedure and administered intravenously within one hour of incision. Patency of the right IJ vein was confirmed with ultrasound with image documentation. An appropriate skin site was determined. Skin site was marked. Region was prepped using maximum barrier technique including cap and mask, sterile gown, sterile gloves, large sterile sheet, and Chlorhexidine as cutaneous antisepsis. The region was infiltrated locally with 1% lidocaine. Under real-time ultrasound guidance, the right IJ vein was accessed with a 21 gauge micropuncture needle; the needle tip within the vein was confirmed with ultrasound image documentation. Needle was exchanged over a 018 guidewire for transitional dilator which allowed passage of the [facility] wire into the  IVC. Over this, the transitional dilator was exchanged for a 5 [place] MPA catheter. A small incision was made on the right anterior chest wall and a subcutaneous pocket fashioned. The power-injectable port was positioned and its catheter tunneled to the right IJ dermatotomy site. The MPA catheter was exchanged over an Amplatz wire for a peel-away sheath, through which the port catheter, which had been trimmed to the appropriate length, was advanced and positioned under fluoroscopy with its tip at the cavoatrial junction. Spot chest radiograph confirms good catheter position and no pneumothorax. The pocket was closed with deep interrupted and subcuticular continuous 3-0 Monocryl sutures. The port was flushed per protocol. The incisions were covered with Dermabond then covered with a sterile dressing. COMPLICATIONS: COMPLICATIONS None immediate IMPRESSION: Technically successful right IJ power-injectable port catheter placement. Ready for routine use. Electronically Signed   By: Lucrezia Europe M.D.   On: 09/11/2018 16:46     PATHOLOGY:    ASSESSMENT AND PLAN:  Non-small cell lung cancer, right (HCC) Stage IVA ([id number]) NSCLC, adenocarcinoma-type, biopsy-proven on right submandibular biopsy by Dr. Benjamine Mola on 06/26/2018 showing a poorly differentiated adenocarcinoma, TTF-1 +, CK +.  FoundationOne testing demonstrated MS-stable, TMB-intermediate (without any other actionable mutations). Staging PET scan on 07/15/2018 demonstrated a hypermetabolic 1.8 cm RUL pulmonary nodule, ipsilateral hypermetabolic mediastinal adenopathy, and 2.3 cm hypermetabolic right submandibular lymph node.  Baseline brain imaging on 07/24/2018 demonstrated extreme widespread metastatic disease to the brain, largest measuring 2.5 cm.  She is S/P whole brain XRT (08/02/2018- 08/15/2018).  She started single-agent Pemetrexed on 09/05/2018.  Labs today: CBC diff, CMET, Magnesium.  I personally reviewed and went over laboratory results with the patient.   The results are noted within this dictation.  Blood counts today demonstrate a white blood cell count of 13.5 with an ANC of 11.5, hemoglobin 10.2 g/dL, blood count 442,000.  Lab satisfy treatment parameters today.  Today is day 1 of cycle #2  Nursing, in accordance with chemotherapy administration protocol, will monitor for acute side effects/toxicities associated with chemotherapy administration today.  Due to her history of constipation, I have prescribed Senokot S2 tablets daily as a bowel regimen.    Return in 3 weeks for follow-up and ongoing treatment.   2. Brain metastases ([place]) Completed radiation therapy, whole brain on 08/15/2018  3. Encounter for antineoplastic chemotherapy Received her first dose of single agent pemetrexed on 06/26/5008 complicated by pneumonia.  She is now embarking on  cycle #2 today.  4. Chronic obstructive pulmonary disease, unspecified COPD type ([place]) Oxygen dependent  5. Goals of care, counseling/discussion Ongoing   ORDERS PLACED FOR THIS ENCOUNTER: No orders of the defined types were placed in this encounter.   MEDICATIONS PRESCRIBED THIS ENCOUNTER: No orders of the defined types were placed in this encounter.   THERAPY PLAN:  Single agent pemetrexed  All questions were answered. The patient knows to call the clinic with any problems, questions or concerns. We can certainly see the patient much sooner if necessary.  Patient and plan discussed with Dr. Derek Jack and she is in agreement with the aforementioned.   This note is electronically signed by: Robynn Pane, PA-C 10/09/2018 10:42 AM

## 2018-10-09 NOTE — Patient Instructions (Signed)
[place] at [facility] Discharge Instructions  Laboratory work updated today.  Lab satisfy treatment parameters today. Cycle #2 of pemetrexed. Order written for Senokot S daily (2 tablets daily) as bowel management to prevent constipation. Return in 3 weeks for follow-up.   Thank you for choosing [place] at [facility] to provide your oncology and hematology care.  To afford each patient quality time with our provider, please arrive at least 15 minutes before your scheduled appointment time.   If you have a lab appointment with the [facility] please come in thru the  Main Entrance and check in at the main information desk  You need to re-schedule your appointment should you arrive 10 or more minutes late.  We strive to give you quality time with our providers, and arriving late affects you and other patients whose appointments are after yours.  Also, if you no show three or more times for appointments you may be dismissed from the clinic at the providers discretion.     Again, thank you for choosing [facility].  Our hope is that these requests will decrease the amount of time that you wait before being seen by our physicians.       _____________________________________________________________  Should you have questions after your visit to [facility], please contact our office at [phone] between the hours of 8:00 a.m. and 4:30 p.m.  Voicemails left after 4:00 p.m. will not be returned until the following business day.  For prescription refill requests, have your pharmacy contact our office and allow 72 hours.    Cancer Center Support Programs:   > Cancer Support Group  2nd Tuesday of the month 1pm-2pm, Journey Room

## 2018-10-12 ENCOUNTER — Other Ambulatory Visit: Payer: Self-pay | Admitting: "Endocrinology

## 2018-10-16 DIAGNOSIS — C3491 Malignant neoplasm of unspecified part of right bronchus or lung: Secondary | ICD-10-CM | POA: Diagnosis not present

## 2018-10-16 DIAGNOSIS — M1990 Unspecified osteoarthritis, unspecified site: Secondary | ICD-10-CM | POA: Diagnosis not present

## 2018-10-16 DIAGNOSIS — Z87891 Personal history of nicotine dependence: Secondary | ICD-10-CM | POA: Diagnosis not present

## 2018-10-16 DIAGNOSIS — J181 Lobar pneumonia, unspecified organism: Secondary | ICD-10-CM | POA: Diagnosis not present

## 2018-10-16 DIAGNOSIS — Z9981 Dependence on supplemental oxygen: Secondary | ICD-10-CM | POA: Diagnosis not present

## 2018-10-16 DIAGNOSIS — Z79899 Other long term (current) drug therapy: Secondary | ICD-10-CM | POA: Diagnosis not present

## 2018-10-16 DIAGNOSIS — J440 Chronic obstructive pulmonary disease with acute lower respiratory infection: Secondary | ICD-10-CM | POA: Diagnosis not present

## 2018-10-16 DIAGNOSIS — J9621 Acute and chronic respiratory failure with hypoxia: Secondary | ICD-10-CM | POA: Diagnosis not present

## 2018-10-16 DIAGNOSIS — C7931 Secondary malignant neoplasm of brain: Secondary | ICD-10-CM | POA: Diagnosis not present

## 2018-10-17 ENCOUNTER — Ambulatory Visit (HOSPITAL_COMMUNITY): Payer: Medicare Other

## 2018-10-17 ENCOUNTER — Other Ambulatory Visit (HOSPITAL_COMMUNITY): Payer: Medicare Other

## 2018-10-17 ENCOUNTER — Ambulatory Visit (HOSPITAL_COMMUNITY): Payer: Medicare Other | Admitting: Hematology

## 2018-10-18 DIAGNOSIS — J9621 Acute and chronic respiratory failure with hypoxia: Secondary | ICD-10-CM | POA: Diagnosis not present

## 2018-10-18 DIAGNOSIS — C7931 Secondary malignant neoplasm of brain: Secondary | ICD-10-CM | POA: Diagnosis not present

## 2018-10-18 DIAGNOSIS — C3491 Malignant neoplasm of unspecified part of right bronchus or lung: Secondary | ICD-10-CM | POA: Diagnosis not present

## 2018-10-18 DIAGNOSIS — M1990 Unspecified osteoarthritis, unspecified site: Secondary | ICD-10-CM | POA: Diagnosis not present

## 2018-10-18 DIAGNOSIS — J440 Chronic obstructive pulmonary disease with acute lower respiratory infection: Secondary | ICD-10-CM | POA: Diagnosis not present

## 2018-10-18 DIAGNOSIS — J181 Lobar pneumonia, unspecified organism: Secondary | ICD-10-CM | POA: Diagnosis not present

## 2018-10-19 DIAGNOSIS — C7931 Secondary malignant neoplasm of brain: Secondary | ICD-10-CM | POA: Diagnosis not present

## 2018-10-19 DIAGNOSIS — M1990 Unspecified osteoarthritis, unspecified site: Secondary | ICD-10-CM | POA: Diagnosis not present

## 2018-10-19 DIAGNOSIS — J181 Lobar pneumonia, unspecified organism: Secondary | ICD-10-CM | POA: Diagnosis not present

## 2018-10-19 DIAGNOSIS — J440 Chronic obstructive pulmonary disease with acute lower respiratory infection: Secondary | ICD-10-CM | POA: Diagnosis not present

## 2018-10-19 DIAGNOSIS — C3491 Malignant neoplasm of unspecified part of right bronchus or lung: Secondary | ICD-10-CM | POA: Diagnosis not present

## 2018-10-19 DIAGNOSIS — J9621 Acute and chronic respiratory failure with hypoxia: Secondary | ICD-10-CM | POA: Diagnosis not present

## 2018-10-21 DIAGNOSIS — C7931 Secondary malignant neoplasm of brain: Secondary | ICD-10-CM | POA: Diagnosis not present

## 2018-10-21 DIAGNOSIS — E663 Overweight: Secondary | ICD-10-CM | POA: Diagnosis not present

## 2018-10-21 DIAGNOSIS — E039 Hypothyroidism, unspecified: Secondary | ICD-10-CM | POA: Diagnosis not present

## 2018-10-21 DIAGNOSIS — Z6825 Body mass index (BMI) 25.0-25.9, adult: Secondary | ICD-10-CM | POA: Diagnosis not present

## 2018-10-21 DIAGNOSIS — C3490 Malignant neoplasm of unspecified part of unspecified bronchus or lung: Secondary | ICD-10-CM | POA: Diagnosis not present

## 2018-10-21 DIAGNOSIS — Z1389 Encounter for screening for other disorder: Secondary | ICD-10-CM | POA: Diagnosis not present

## 2018-10-22 DIAGNOSIS — J9621 Acute and chronic respiratory failure with hypoxia: Secondary | ICD-10-CM | POA: Diagnosis not present

## 2018-10-22 DIAGNOSIS — M1990 Unspecified osteoarthritis, unspecified site: Secondary | ICD-10-CM | POA: Diagnosis not present

## 2018-10-22 DIAGNOSIS — J181 Lobar pneumonia, unspecified organism: Secondary | ICD-10-CM | POA: Diagnosis not present

## 2018-10-22 DIAGNOSIS — J440 Chronic obstructive pulmonary disease with acute lower respiratory infection: Secondary | ICD-10-CM | POA: Diagnosis not present

## 2018-10-22 DIAGNOSIS — C3491 Malignant neoplasm of unspecified part of right bronchus or lung: Secondary | ICD-10-CM | POA: Diagnosis not present

## 2018-10-22 DIAGNOSIS — C7931 Secondary malignant neoplasm of brain: Secondary | ICD-10-CM | POA: Diagnosis not present

## 2018-10-24 DIAGNOSIS — C3491 Malignant neoplasm of unspecified part of right bronchus or lung: Secondary | ICD-10-CM | POA: Diagnosis not present

## 2018-10-24 DIAGNOSIS — C7931 Secondary malignant neoplasm of brain: Secondary | ICD-10-CM | POA: Diagnosis not present

## 2018-10-24 DIAGNOSIS — M1990 Unspecified osteoarthritis, unspecified site: Secondary | ICD-10-CM | POA: Diagnosis not present

## 2018-10-24 DIAGNOSIS — J9621 Acute and chronic respiratory failure with hypoxia: Secondary | ICD-10-CM | POA: Diagnosis not present

## 2018-10-24 DIAGNOSIS — J440 Chronic obstructive pulmonary disease with acute lower respiratory infection: Secondary | ICD-10-CM | POA: Diagnosis not present

## 2018-10-24 DIAGNOSIS — J181 Lobar pneumonia, unspecified organism: Secondary | ICD-10-CM | POA: Diagnosis not present

## 2018-10-25 DIAGNOSIS — J440 Chronic obstructive pulmonary disease with acute lower respiratory infection: Secondary | ICD-10-CM | POA: Diagnosis not present

## 2018-10-25 DIAGNOSIS — C7931 Secondary malignant neoplasm of brain: Secondary | ICD-10-CM | POA: Diagnosis not present

## 2018-10-25 DIAGNOSIS — M1990 Unspecified osteoarthritis, unspecified site: Secondary | ICD-10-CM | POA: Diagnosis not present

## 2018-10-25 DIAGNOSIS — J181 Lobar pneumonia, unspecified organism: Secondary | ICD-10-CM | POA: Diagnosis not present

## 2018-10-25 DIAGNOSIS — C3491 Malignant neoplasm of unspecified part of right bronchus or lung: Secondary | ICD-10-CM | POA: Diagnosis not present

## 2018-10-25 DIAGNOSIS — J9621 Acute and chronic respiratory failure with hypoxia: Secondary | ICD-10-CM | POA: Diagnosis not present

## 2018-10-29 DIAGNOSIS — C3491 Malignant neoplasm of unspecified part of right bronchus or lung: Secondary | ICD-10-CM | POA: Diagnosis not present

## 2018-10-29 DIAGNOSIS — J440 Chronic obstructive pulmonary disease with acute lower respiratory infection: Secondary | ICD-10-CM | POA: Diagnosis not present

## 2018-10-29 DIAGNOSIS — M1990 Unspecified osteoarthritis, unspecified site: Secondary | ICD-10-CM | POA: Diagnosis not present

## 2018-10-29 DIAGNOSIS — J181 Lobar pneumonia, unspecified organism: Secondary | ICD-10-CM | POA: Diagnosis not present

## 2018-10-29 DIAGNOSIS — J9621 Acute and chronic respiratory failure with hypoxia: Secondary | ICD-10-CM | POA: Diagnosis not present

## 2018-10-29 DIAGNOSIS — C7931 Secondary malignant neoplasm of brain: Secondary | ICD-10-CM | POA: Diagnosis not present

## 2018-10-30 ENCOUNTER — Encounter (HOSPITAL_COMMUNITY): Payer: Self-pay | Admitting: Hematology

## 2018-10-30 ENCOUNTER — Inpatient Hospital Stay (HOSPITAL_COMMUNITY): Payer: Medicare Other

## 2018-10-30 ENCOUNTER — Other Ambulatory Visit: Payer: Self-pay

## 2018-10-30 ENCOUNTER — Inpatient Hospital Stay (HOSPITAL_BASED_OUTPATIENT_CLINIC_OR_DEPARTMENT_OTHER): Payer: Medicare Other | Admitting: Hematology

## 2018-10-30 ENCOUNTER — Inpatient Hospital Stay (HOSPITAL_COMMUNITY): Payer: Medicare Other | Attending: Hematology

## 2018-10-30 VITALS — BP 122/72 | HR 104 | Temp 98.3°F | Resp 16 | Wt 135.2 lb

## 2018-10-30 DIAGNOSIS — C3491 Malignant neoplasm of unspecified part of right bronchus or lung: Secondary | ICD-10-CM | POA: Diagnosis not present

## 2018-10-30 DIAGNOSIS — C7931 Secondary malignant neoplasm of brain: Secondary | ICD-10-CM | POA: Diagnosis not present

## 2018-10-30 DIAGNOSIS — K5900 Constipation, unspecified: Secondary | ICD-10-CM

## 2018-10-30 DIAGNOSIS — R197 Diarrhea, unspecified: Secondary | ICD-10-CM

## 2018-10-30 DIAGNOSIS — R42 Dizziness and giddiness: Secondary | ICD-10-CM

## 2018-10-30 DIAGNOSIS — R05 Cough: Secondary | ICD-10-CM | POA: Diagnosis not present

## 2018-10-30 DIAGNOSIS — F329 Major depressive disorder, single episode, unspecified: Secondary | ICD-10-CM

## 2018-10-30 DIAGNOSIS — Z87891 Personal history of nicotine dependence: Secondary | ICD-10-CM

## 2018-10-30 DIAGNOSIS — R51 Headache: Secondary | ICD-10-CM

## 2018-10-30 DIAGNOSIS — Z5111 Encounter for antineoplastic chemotherapy: Secondary | ICD-10-CM | POA: Insufficient documentation

## 2018-10-30 DIAGNOSIS — R630 Anorexia: Secondary | ICD-10-CM

## 2018-10-30 DIAGNOSIS — C3411 Malignant neoplasm of upper lobe, right bronchus or lung: Secondary | ICD-10-CM

## 2018-10-30 LAB — CBC WITH DIFFERENTIAL/PLATELET
Abs Immature Granulocytes: 1.52 10*3/uL — ABNORMAL HIGH (ref 0.00–0.07)
BASOS ABS: 0.2 10*3/uL — AB (ref 0.0–0.1)
BASOS PCT: 1 %
EOS ABS: 0.1 10*3/uL (ref 0.0–0.5)
Eosinophils Relative: 1 %
HCT: 33.9 % — ABNORMAL LOW (ref 36.0–46.0)
Hemoglobin: 10.2 g/dL — ABNORMAL LOW (ref 12.0–15.0)
Immature Granulocytes: 9 %
Lymphocytes Relative: 6 %
Lymphs Abs: 1.1 10*3/uL (ref 0.7–4.0)
MCH: 28.4 pg (ref 26.0–34.0)
MCHC: 30.1 g/dL (ref 30.0–36.0)
MCV: 94.4 fL (ref 80.0–100.0)
Monocytes Absolute: 0.6 10*3/uL (ref 0.1–1.0)
Monocytes Relative: 3 %
NEUTROS PCT: 80 %
NRBC: 0.2 % (ref 0.0–0.2)
Neutro Abs: 14.3 10*3/uL — ABNORMAL HIGH (ref 1.7–7.7)
Platelets: 699 10*3/uL — ABNORMAL HIGH (ref 150–400)
RBC: 3.59 MIL/uL — AB (ref 3.87–5.11)
RDW: 18.8 % — AB (ref 11.5–15.5)
WBC: 17.8 10*3/uL — AB (ref 4.0–10.5)

## 2018-10-30 LAB — COMPREHENSIVE METABOLIC PANEL
ALBUMIN: 3 g/dL — AB (ref 3.5–5.0)
ALT: 15 U/L (ref 0–44)
ANION GAP: 11 (ref 5–15)
AST: 23 U/L (ref 15–41)
Alkaline Phosphatase: 118 U/L (ref 38–126)
BUN: 5 mg/dL — ABNORMAL LOW (ref 8–23)
CO2: 28 mmol/L (ref 22–32)
Calcium: 8.8 mg/dL — ABNORMAL LOW (ref 8.9–10.3)
Chloride: 102 mmol/L (ref 98–111)
Creatinine, Ser: 0.48 mg/dL (ref 0.44–1.00)
GFR calc non Af Amer: >60 mL/min (ref >60)
GLUCOSE: 131 mg/dL — AB (ref 70–99)
POTASSIUM: 4.2 mmol/L (ref 3.5–5.1)
SODIUM: 141 mmol/L (ref 135–145)
Total Bilirubin: 0.5 mg/dL (ref 0.3–1.2)
Total Protein: 7 g/dL (ref 6.5–8.1)

## 2018-10-30 MED ORDER — HEPARIN SOD (PORK) LOCK FLUSH 100 UNIT/ML IV SOLN
500.0000 [IU] | Freq: Once | INTRAVENOUS | Status: AC | PRN
  Administered 2018-10-30: 500 [IU]

## 2018-10-30 MED ORDER — LEVOFLOXACIN 500 MG PO TABS: 500.0000 mg | ORAL_TABLET | Freq: Every day | ORAL | 0 refills | Status: AC

## 2018-10-30 MED ORDER — SODIUM CHLORIDE 0.9 % IV SOLN
Freq: Once | INTRAVENOUS | Status: AC
  Administered 2018-10-30: 11:00:00 via INTRAVENOUS

## 2018-10-30 MED ORDER — SODIUM CHLORIDE 0.9 % IV SOLN
475.0000 mg/m2 | Freq: Once | INTRAVENOUS | Status: AC
  Administered 2018-10-30: 800 mg via INTRAVENOUS
  Filled 2018-10-30: qty 12

## 2018-10-30 MED ORDER — CYANOCOBALAMIN 1000 MCG/ML IJ SOLN
1000.0000 ug | Freq: Once | INTRAMUSCULAR | Status: AC
  Administered 2018-10-30: 1000 ug via INTRAMUSCULAR
  Filled 2018-10-30: qty 1

## 2018-10-30 MED ORDER — PROCHLORPERAZINE MALEATE 10 MG PO TABS
10.0000 mg | ORAL_TABLET | Freq: Once | ORAL | Status: AC
  Administered 2018-10-30: 10 mg via ORAL
  Filled 2018-10-30: qty 1

## 2018-10-30 NOTE — Assessment & Plan Note (Signed)
1.  Stage IVa ([id number]) poorly differentiated adenocarcinoma of the right lung with extensive brain metastasis: Foundation 1 testing shows MS-stable, TMB-intermediate and no other actionable mutations. - Presentation with the 2 to 78-monthhistory of right submandibular mass, seen by Dr. TBenjamine Mola  Flexible fiberoptic laryngoscopy showed normal examination. - She underwent right submandibular lymph node biopsy on 06/26/2018 which showed metastatic poorly differentiated adenocarcinoma, TTF-1 positive, CK positive. - Patient is a current active smoker, quit 2 weeks ago, smoked about half pack per day for more than 25 years. -PET CT scan on 07/15/2018 shows hypermetabolic 1.8 cm right upper lobe pulmonary nodule, suspicious for primary bronchus and carcinoma, ipsilateral hypermetabolic mediastinal adenopathy consistent with metastatic disease, 2.3 cm hypermetabolic right submandibular lymph node. - Patient reportedly fell 2 weeks ago and since then has been feeling dizzy and unsteady.  Also has occasional nausea and vomited for the last 2 weeks mostly after eating.  Taking Zofran which occasionally helps. -Brain MRI on 07/24/2018 showed extremely widespread metastatic disease to the brain, more than 40 minutes, largest 2.5 cm. - She received whole brain radiation therapy from 08/02/2018 through 08/15/2018, 30 Gray in 10 fractions. - She received single agent pemetrexed on 09/05/2018. - She was admitted to the hospital with pneumonia, sepsis and COPD exacerbation from 09/15/2018 through 09/23/2018. - She is currently living at home for the last 3 weeks.  She is undergoing physical therapy and occupational therapy. - She received cycle 2 of chemotherapy on 10/09/2018. -For the past 1 week she is experiencing vertigo and feels like room is spinning around.  She also has some cough with greenish expectoration. -We will send her Levaquin 500 mg for 7 days.  She was told to take Dramamine as needed.  We will also obtain MRI  of the brain with and without contrast. - She may proceed with cycle 3 of chemotherapy today.  We will follow-up on PDL 1 test results.  I will also order CT scan prior to next visit to evaluate response.  If there is a progression, she will be a candidate for single agent immunotherapy.  2.  Family history: -She had one sister who had TNBC at 483  Another sister had breast cancer at age 53597  Maternal aunt had breast cancer at age 66  I have recommended her sister to undergo testing for BRCA 1/2 .  3.  Loss of appetite: - She was taking mirtazapine 7.5 mg while she was in the nursing home.  This helped her appetite.  When she was discharged, she was switched to Lexapro 10 mg as mirtazapine was not helping with her depression.  Lexapro seems to help evaluate the depression. -I have reviewed drug interactions between the 2 medications.  As she does not have any appetite, I have recommended adding mirtazapine 7.5 mg at bedtime.

## 2018-10-30 NOTE — Progress Notes (Signed)
Tolerated infusion w/o adverse reaction.  Alert, in no distress.  VSS.  Discharged via wheelchair in c/o family.  

## 2018-10-30 NOTE — Patient Instructions (Signed)
[place] at [facility]  Discharge Instructions:  You saw Dr. Jamey Reas today. _______________________________________________________________  Thank you for choosing [place] at [facility] to provide your oncology and hematology care.  To afford each patient quality time with our providers, please arrive at least 15 minutes before your scheduled appointment.  You need to re-schedule your appointment if you arrive 10 or more minutes late.  We strive to give you quality time with our providers, and arriving late affects you and other patients whose appointments are after yours.  Also, if you no show three or more times for appointments you may be dismissed from the clinic.  Again, thank you for choosing [place] at [facility] hope is that these requests will allow you access to exceptional care and in a timely manner. _______________________________________________________________  If you have questions after your visit, please contact our office at [phone] between the hours of 8:30 a.m. and 5:00 p.m. Voicemails left after 4:30 p.m. will not be returned until the following business day. _______________________________________________________________  For prescription refill requests, have your pharmacy contact our office. _______________________________________________________________  Recommendations made by the consultant and any test results will be sent to your referring physician. _______________________________________________________________

## 2018-10-30 NOTE — Progress Notes (Signed)
[name], Theresa Vincent [id number]   CLINIC:  Medical Oncology/Hematology  PCP:  Theresa Vincent, [place] [address] [phone]   REASON FOR VISIT: Follow-up for metastatic non small cell lung cancer to the brain  CURRENT THERAPY: pemetrexed (ALIMTA)  BRIEF ONCOLOGIC HISTORY:    Non-small cell lung cancer, right ([place])   07/16/2018 Initial Diagnosis    Non-small cell lung cancer, right ([place])    09/03/2018 -  Chemotherapy    The patient had PEMEtrexed (ALIMTA) 850 mg in sodium chloride 0.9 % 100 mL chemo infusion, 500 mg/m2 = 850 mg, Intravenous,  Once, 3 of 6 cycles Administration: 850 mg (09/05/2018), 800 mg (10/09/2018)  for chemotherapy treatment.      Brain metastases ([place])   08/01/2018 Initial Diagnosis    Brain metastases ([place])    09/03/2018 -  Chemotherapy    The patient had PEMEtrexed (ALIMTA) 850 mg in sodium chloride 0.9 % 100 mL chemo infusion, 500 mg/m2 = 850 mg, Intravenous,  Once, 3 of 6 cycles Administration: 850 mg (09/05/2018), 800 mg (10/09/2018)  for chemotherapy treatment.       INTERVAL HISTORY:  Theresa Vincent 67 y.o. female returns for routine follow-up for metastatic non small cell lung cancer. Patient reports severe dizziness even while laying flat and not moving. She feeling like the room is spinning. She is also having headaches. Her appetite has decreased further. She reports appetite and energy level at 50%. Her fatigue is worse she lays around the house and she has no activity. She has intermittent diarrhea and constipation. She denies any new pains. Denies any bleeding or easy bruising.  She was discharged from the [facility] center and has been home with her daughter for about 3 weeks now.     REVIEW OF SYSTEMS:  Review of Systems  Constitutional: Positive for fatigue.  Gastrointestinal: Positive for constipation, diarrhea, nausea and vomiting.  Skin: Positive for itching.  Neurological:  Positive for dizziness and headaches.  Hematological: Bruises/bleeds easily.  All other systems reviewed and are negative.    PAST MEDICAL/SURGICAL HISTORY:  Past Medical History:  Diagnosis Date  . Arthritis    in back   . Cancer ([place])   . Diverticula of colon   . Diverticulosis   . Hemorrhoid   . Thyroid disease    was hyperthyroid had frozen and now on meds   Past Surgical History:  Procedure Laterality Date  . Broken toe screw in toe    . EYE SURGERY  03/20/16  . IR IMAGING GUIDED PORT INSERTION  09/11/2018  . pt had her bladder stretched in the past     Dr. Michela Pitcher   . radiation thyroid    . WRIST SURGERY Right      SOCIAL HISTORY:  Social History   Socioeconomic History  . Marital status: Legally Separated    Spouse name: Not on file  . Number of children: Not on file  . Years of education: hs   . Highest education level: Not on file  Occupational History    Employer: [employer]  . Financial resource strain: Not on file  . Food insecurity:    Worry: Not on file    Inability: Not on file  . Transportation needs:    Medical: Not on file    Non-medical: Not on file  Tobacco Use  . Smoking status: Former Smoker    Packs/day: 0.00    Years:  45.00    Pack years: 0.00  . Smokeless tobacco: Never Used  Substance and Sexual Activity  . Alcohol use: Yes    Comment: rarely   . Drug use: No  . Sexual activity: Not Currently    Birth control/protection: Post-menopausal  Lifestyle  . Physical activity:    Days per week: Not on file    Minutes per session: Not on file  . Stress: Not on file  Relationships  . Social connections:    Talks on phone: Not on file    Gets together: Not on file    Attends religious service: Not on file    Active member of club or organization: Not on file    Attends meetings of clubs or organizations: Not on file    Relationship status: Not on file  . Intimate partner violence:    Fear of current or ex  partner: Not on file    Emotionally abused: Not on file    Physically abused: Not on file    Forced sexual activity: Not on file  Other Topics Concern  . Not on file  Social History Narrative  . Not on file    FAMILY HISTORY:  Family History  Problem Relation Age of Onset  . Diabetes Mother   . Heart disease Father   . Diabetes Father   . Diabetes Sister   . Diabetes Sister   . Cancer Sister        breast    CURRENT MEDICATIONS:  Outpatient Encounter Medications as of 10/30/2018  Medication Sig  . acetaminophen (TYLENOL) 500 MG tablet Take 500 mg by mouth daily as needed for moderate pain or headache.  . dexamethasone (DECADRON) 4 MG tablet Take 1 tab two times a day the day before Alimta chemo. Take 2 tabs the day after chemo, then take 2 tabs two times a day for 2 days.  Marland Kitchen escitalopram (LEXAPRO) 10 MG tablet   . feeding supplement, ENSURE ENLIVE, (ENSURE ENLIVE) LIQD Take 237 mLs by mouth 2 (two) times daily between meals.  . folic acid (FOLVITE) 1 MG tablet Take 1 mg by mouth daily.  Marland Kitchen guaiFENesin-dextromethorphan (ROBITUSSIN DM) 100-10 MG/5ML syrup Take 5 mLs by mouth every 4 (four) hours as needed for cough (chest congestion).  . hydrocortisone valerate cream (WESTCORT) 0.2 % Apply topically 2 (two) times daily.  Marland Kitchen ibuprofen (ADVIL,MOTRIN) 200 MG tablet Take 400 mg by mouth every 6 (six) hours as needed (pain / headache).  Marland Kitchen levofloxacin (LEVAQUIN) 500 MG tablet Take 1 tablet (500 mg total) by mouth daily for 7 days.  Marland Kitchen lidocaine-prilocaine (EMLA) cream Apply to affected area once (Patient taking differently: Apply 1 application topically daily as needed (local anesthesia). Apply to affected area once)  . Misc. Devices MISC Please supply one wheelchair for patient.  . Multiple Vitamin (MULTIVITAMIN WITH MINERALS) TABS tablet Take 1 tablet by mouth daily.  . ondansetron (ZOFRAN) 8 MG tablet Take 1 tablet (8 mg total) by mouth 2 (two) times daily as needed (Nausea or vomiting).   . PEMEtrexed Disodium (ALIMTA IV) Inject into the vein every 21 ( twenty-one) days.  . prochlorperazine (COMPAZINE) 10 MG tablet Take 1 tablet (10 mg total) by mouth every 6 (six) hours as needed (Nausea or vomiting).  . promethazine (PHENERGAN) 12.5 MG suppository Place 1 suppository (12.5 mg total) rectally every 6 (six) hours as needed for nausea or vomiting.  . promethazine (PHENERGAN) 12.5 MG tablet Take 1 tablet (12.5 mg total) by mouth  every 6 (six) hours as needed for nausea or vomiting.  . pseudoephedrine-acetaminophen (TYLENOL SINUS) 30-500 MG TABS tablet Take 1 tablet by mouth daily as needed (allergies).  . SPIRIVA HANDIHALER 18 MCG inhalation capsule   . VENTOLIN HFA 108 (90 Base) MCG/ACT inhaler   . [DISCONTINUED] levothyroxine (SYNTHROID, LEVOTHROID) 25 MCG tablet TAKE 1 TABLET (25 MCG TOTAL) BY MOUTH DAILY BEFORE BREAKFAST. (Patient not taking: Reported on 10/30/2018)  . [DISCONTINUED] levothyroxine (SYNTHROID, LEVOTHROID) 25 MCG tablet TAKE 1 TABLET (25 MCG TOTAL) BY MOUTH DAILY BEFORE BREAKFAST. (Patient not taking: Reported on 10/30/2018)  . [DISCONTINUED] pantoprazole (PROTONIX) 40 MG tablet Take 1 tablet (40 mg total) by mouth daily at 6 (six) AM.   No facility-administered encounter medications on file as of 10/30/2018.     ALLERGIES:  Allergies  Allergen Reactions  . Avelox [Moxifloxacin Hcl In Nacl] Itching     PHYSICAL EXAM:  ECOG Performance status: 1  Vitals:   10/30/18 0926  BP: 122/72  Pulse: (!) 104  Resp: 16  Temp: 98.3 F (36.8 C)  SpO2: 95%   Filed Weights   10/30/18 0926  Weight: 135 lb 3.2 oz (61.3 kg)    Physical Exam  Constitutional: She is oriented to person, place, and time. She appears well-developed and well-nourished.  Cardiovascular: Normal rate, regular rhythm and normal heart sounds.  Pulmonary/Chest: Effort normal and breath sounds normal.  Musculoskeletal: Normal range of motion.  Neurological: She is alert and oriented to  person, place, and time.  Skin: Skin is warm and dry.  Psychiatric: She has a normal mood and affect. Her behavior is normal. Judgment and thought content normal.     LABORATORY DATA:  I have reviewed the labs as listed.  CBC    Component Value Date/Time   WBC 17.8 (H) 10/30/2018 0854   RBC 3.59 (L) 10/30/2018 0854   HGB 10.2 (L) 10/30/2018 0854   HCT 33.9 (L) 10/30/2018 0854   PLT 699 (H) 10/30/2018 0854   MCV 94.4 10/30/2018 0854   MCH 28.4 10/30/2018 0854   MCHC 30.1 10/30/2018 0854   RDW 18.8 (H) 10/30/2018 0854   LYMPHSABS 1.1 10/30/2018 0854   MONOABS 0.6 10/30/2018 0854   EOSABS 0.1 10/30/2018 0854   BASOSABS 0.2 (H) 10/30/2018 0854   CMP Latest Ref Rng & Units 10/30/2018 10/09/2018 09/24/2018  Glucose 70 - 99 mg/dL 131(H) 193(H) 109(H)  BUN 8 - 23 mg/dL 5(L) 8 7(L)  Creatinine 0.44 - 1.00 mg/dL 0.48 0.46 <0.30(L)  Sodium 135 - 145 mmol/L 141 137 141  Potassium 3.5 - 5.1 mmol/L 4.2 4.2 3.1(L)  Chloride 98 - 111 mmol/L 102 98 102  CO2 22 - 32 mmol/L _0 Calcium 8.9 - 10.3 mg/dL 8.8(L) 8.8(L) 8.1(L)  Total Protein 6.5 - 8.1 g/dL 7.0 6.3(L) 5.6(L)  Total Bilirubin 0.3 - 1.2 mg/dL 0.5 0.5 0.5  Alkaline Phos 38 - 126 U/L 118 122 219(H)  AST 15 - 41 U/L 23 27 327(H)  ALT 0 - 44 U/L 15 22 176(H)        ASSESSMENT & PLAN:   Non-small cell lung cancer, right (HCC) 1.  Stage IVa (T1BN2M1B) poorly differentiated adenocarcinoma of the right lung with extensive brain metastasis: Foundation 1 testing shows MS-stable, TMB-intermediate and no other actionable mutations. - Presentation with the 2 to 5-monthhistory of right submandibular mass, seen by Dr. TBenjamine Mola  Flexible fiberoptic laryngoscopy showed normal examination. - She underwent right submandibular lymph node biopsy on 06/26/2018 which  showed metastatic poorly differentiated adenocarcinoma, TTF-1 positive, CK positive. - Patient is a current active smoker, quit 2 weeks ago, smoked about half pack per day for more  than 25 years. -PET CT scan on 07/15/2018 shows hypermetabolic 1.8 cm right upper lobe pulmonary nodule, suspicious for primary bronchus and carcinoma, ipsilateral hypermetabolic mediastinal adenopathy consistent with metastatic disease, 2.3 cm hypermetabolic right submandibular lymph node. - Patient reportedly fell 2 weeks ago and since then has been feeling dizzy and unsteady.  Also has occasional nausea and vomited for the last 2 weeks mostly after eating.  Taking Zofran which occasionally helps. -Brain MRI on 07/24/2018 showed extremely widespread metastatic disease to the brain, more than 40 minutes, largest 2.5 cm. - She received whole brain radiation therapy from 08/02/2018 through 08/15/2018, 30 Gray in 10 fractions. - She received single agent pemetrexed on 09/05/2018. - She was admitted to the hospital with pneumonia, sepsis and COPD exacerbation from 09/15/2018 through 09/23/2018. - She is currently living at home for the last 3 weeks.  She is undergoing physical therapy and occupational therapy. - She received cycle 2 of chemotherapy on 10/09/2018. -For the past 1 week she is experiencing vertigo and feels like room is spinning around.  She also has some cough with greenish expectoration. -We will send her Levaquin 500 mg for 7 days.  She was told to take Dramamine as needed.  We will also obtain MRI of the brain with and without contrast. - She may proceed with cycle 3 of chemotherapy today.  We will follow-up on PDL 1 test results.  I will also order CT scan prior to next visit to evaluate response.  If there is a progression, she will be a candidate for single agent immunotherapy.  2.  Family history: -She had one sister who had TNBC at 61.  Another sister had breast cancer at age 42.  Maternal aunt had breast cancer at age 49.  I have recommended her sister to undergo testing for BRCA 1/2 .  3.  Loss of appetite: - She was taking mirtazapine 7.5 mg while she was in the nursing home.  This  helped her appetite.  When she was discharged, she was switched to Lexapro 10 mg as mirtazapine was not helping with her depression.  Lexapro seems to help evaluate the depression. -I have reviewed drug interactions between the 2 medications.  As she does not have any appetite, I have recommended adding mirtazapine 7.5 mg at bedtime.      Orders placed this encounter:  Orders Placed This Encounter  Procedures  . MR Brain W Wo Contrast  . CT Abdomen Pelvis W Contrast  . CT Chest W Contrast  . Magnesium  . CBC with Differential/Platelet  . Comprehensive metabolic panel  . Phosphorus      Derek Jack, MD [place] [phone]

## 2018-11-01 DIAGNOSIS — C7931 Secondary malignant neoplasm of brain: Secondary | ICD-10-CM | POA: Diagnosis not present

## 2018-11-01 DIAGNOSIS — C3491 Malignant neoplasm of unspecified part of right bronchus or lung: Secondary | ICD-10-CM | POA: Diagnosis not present

## 2018-11-01 DIAGNOSIS — J9621 Acute and chronic respiratory failure with hypoxia: Secondary | ICD-10-CM | POA: Diagnosis not present

## 2018-11-01 DIAGNOSIS — M1990 Unspecified osteoarthritis, unspecified site: Secondary | ICD-10-CM | POA: Diagnosis not present

## 2018-11-01 DIAGNOSIS — J440 Chronic obstructive pulmonary disease with acute lower respiratory infection: Secondary | ICD-10-CM | POA: Diagnosis not present

## 2018-11-01 DIAGNOSIS — J181 Lobar pneumonia, unspecified organism: Secondary | ICD-10-CM | POA: Diagnosis not present

## 2018-11-05 DIAGNOSIS — M1990 Unspecified osteoarthritis, unspecified site: Secondary | ICD-10-CM | POA: Diagnosis not present

## 2018-11-05 DIAGNOSIS — J9621 Acute and chronic respiratory failure with hypoxia: Secondary | ICD-10-CM | POA: Diagnosis not present

## 2018-11-05 DIAGNOSIS — C7931 Secondary malignant neoplasm of brain: Secondary | ICD-10-CM | POA: Diagnosis not present

## 2018-11-05 DIAGNOSIS — J181 Lobar pneumonia, unspecified organism: Secondary | ICD-10-CM | POA: Diagnosis not present

## 2018-11-05 DIAGNOSIS — J440 Chronic obstructive pulmonary disease with acute lower respiratory infection: Secondary | ICD-10-CM | POA: Diagnosis not present

## 2018-11-05 DIAGNOSIS — C3491 Malignant neoplasm of unspecified part of right bronchus or lung: Secondary | ICD-10-CM | POA: Diagnosis not present

## 2018-11-06 DIAGNOSIS — C3491 Malignant neoplasm of unspecified part of right bronchus or lung: Secondary | ICD-10-CM | POA: Diagnosis not present

## 2018-11-06 DIAGNOSIS — J181 Lobar pneumonia, unspecified organism: Secondary | ICD-10-CM | POA: Diagnosis not present

## 2018-11-06 DIAGNOSIS — J9621 Acute and chronic respiratory failure with hypoxia: Secondary | ICD-10-CM | POA: Diagnosis not present

## 2018-11-06 DIAGNOSIS — C7931 Secondary malignant neoplasm of brain: Secondary | ICD-10-CM | POA: Diagnosis not present

## 2018-11-06 DIAGNOSIS — M1990 Unspecified osteoarthritis, unspecified site: Secondary | ICD-10-CM | POA: Diagnosis not present

## 2018-11-06 DIAGNOSIS — J440 Chronic obstructive pulmonary disease with acute lower respiratory infection: Secondary | ICD-10-CM | POA: Diagnosis not present

## 2018-11-07 ENCOUNTER — Ambulatory Visit (HOSPITAL_COMMUNITY): Payer: Medicare Other

## 2018-11-11 DIAGNOSIS — C7931 Secondary malignant neoplasm of brain: Secondary | ICD-10-CM | POA: Diagnosis not present

## 2018-11-11 DIAGNOSIS — J181 Lobar pneumonia, unspecified organism: Secondary | ICD-10-CM | POA: Diagnosis not present

## 2018-11-11 DIAGNOSIS — C3491 Malignant neoplasm of unspecified part of right bronchus or lung: Secondary | ICD-10-CM | POA: Diagnosis not present

## 2018-11-11 DIAGNOSIS — J440 Chronic obstructive pulmonary disease with acute lower respiratory infection: Secondary | ICD-10-CM | POA: Diagnosis not present

## 2018-11-15 ENCOUNTER — Encounter (HOSPITAL_COMMUNITY): Payer: Self-pay

## 2018-11-15 ENCOUNTER — Ambulatory Visit (HOSPITAL_COMMUNITY)
Admission: RE | Admit: 2018-11-15 | Discharge: 2018-11-15 | Disposition: A | Discharge status: Home / Self Care | Payer: Medicare Other | Source: Ambulatory Visit | Attending: Nurse Practitioner | Admitting: Nurse Practitioner

## 2018-11-15 DIAGNOSIS — R918 Other nonspecific abnormal finding of lung field: Secondary | ICD-10-CM | POA: Diagnosis not present

## 2018-11-15 DIAGNOSIS — R590 Localized enlarged lymph nodes: Secondary | ICD-10-CM | POA: Insufficient documentation

## 2018-11-15 DIAGNOSIS — I2510 Atherosclerotic heart disease of native coronary artery without angina pectoris: Secondary | ICD-10-CM | POA: Insufficient documentation

## 2018-11-15 DIAGNOSIS — C3491 Malignant neoplasm of unspecified part of right bronchus or lung: Secondary | ICD-10-CM

## 2018-11-15 DIAGNOSIS — N200 Calculus of kidney: Secondary | ICD-10-CM | POA: Diagnosis not present

## 2018-11-15 DIAGNOSIS — C7931 Secondary malignant neoplasm of brain: Secondary | ICD-10-CM

## 2018-11-15 DIAGNOSIS — J432 Centrilobular emphysema: Secondary | ICD-10-CM | POA: Insufficient documentation

## 2018-11-15 DIAGNOSIS — C3490 Malignant neoplasm of unspecified part of unspecified bronchus or lung: Secondary | ICD-10-CM | POA: Diagnosis not present

## 2018-11-15 MED ORDER — IOPAMIDOL (ISOVUE-300) INJECTION 61%
100.0000 mL | Freq: Once | INTRAVENOUS | Status: AC | PRN
  Administered 2018-11-15: 100 mL via INTRAVENOUS

## 2018-11-15 MED ORDER — GADOBUTROL 1 MMOL/ML IV SOLN
6.0000 mL | Freq: Once | INTRAVENOUS | Status: AC | PRN
  Administered 2018-11-15: 6 mL via INTRAVENOUS

## 2018-11-19 DIAGNOSIS — M1990 Unspecified osteoarthritis, unspecified site: Secondary | ICD-10-CM | POA: Diagnosis not present

## 2018-11-19 DIAGNOSIS — J9621 Acute and chronic respiratory failure with hypoxia: Secondary | ICD-10-CM | POA: Diagnosis not present

## 2018-11-19 DIAGNOSIS — J181 Lobar pneumonia, unspecified organism: Secondary | ICD-10-CM | POA: Diagnosis not present

## 2018-11-19 DIAGNOSIS — C3491 Malignant neoplasm of unspecified part of right bronchus or lung: Secondary | ICD-10-CM | POA: Diagnosis not present

## 2018-11-19 DIAGNOSIS — J440 Chronic obstructive pulmonary disease with acute lower respiratory infection: Secondary | ICD-10-CM | POA: Diagnosis not present

## 2018-11-19 DIAGNOSIS — C7931 Secondary malignant neoplasm of brain: Secondary | ICD-10-CM | POA: Diagnosis not present

## 2018-11-20 ENCOUNTER — Inpatient Hospital Stay (HOSPITAL_COMMUNITY): Payer: Medicare Other

## 2018-11-20 ENCOUNTER — Inpatient Hospital Stay (HOSPITAL_BASED_OUTPATIENT_CLINIC_OR_DEPARTMENT_OTHER): Payer: Medicare Other | Admitting: Hematology

## 2018-11-20 ENCOUNTER — Encounter (HOSPITAL_COMMUNITY): Payer: Self-pay | Admitting: Hematology

## 2018-11-20 VITALS — BP 108/65 | HR 99 | Temp 97.9°F | Resp 16 | Wt 133.5 lb

## 2018-11-20 DIAGNOSIS — R5383 Other fatigue: Secondary | ICD-10-CM

## 2018-11-20 DIAGNOSIS — Z87891 Personal history of nicotine dependence: Secondary | ICD-10-CM | POA: Diagnosis not present

## 2018-11-20 DIAGNOSIS — Z803 Family history of malignant neoplasm of breast: Secondary | ICD-10-CM

## 2018-11-20 DIAGNOSIS — Z5111 Encounter for antineoplastic chemotherapy: Secondary | ICD-10-CM | POA: Diagnosis not present

## 2018-11-20 DIAGNOSIS — C3491 Malignant neoplasm of unspecified part of right bronchus or lung: Secondary | ICD-10-CM

## 2018-11-20 DIAGNOSIS — C7931 Secondary malignant neoplasm of brain: Secondary | ICD-10-CM

## 2018-11-20 DIAGNOSIS — K5900 Constipation, unspecified: Secondary | ICD-10-CM | POA: Diagnosis not present

## 2018-11-20 DIAGNOSIS — C3411 Malignant neoplasm of upper lobe, right bronchus or lung: Secondary | ICD-10-CM

## 2018-11-20 DIAGNOSIS — R42 Dizziness and giddiness: Secondary | ICD-10-CM | POA: Diagnosis not present

## 2018-11-20 DIAGNOSIS — R112 Nausea with vomiting, unspecified: Secondary | ICD-10-CM | POA: Diagnosis not present

## 2018-11-20 DIAGNOSIS — R531 Weakness: Secondary | ICD-10-CM | POA: Diagnosis not present

## 2018-11-20 DIAGNOSIS — R197 Diarrhea, unspecified: Secondary | ICD-10-CM | POA: Diagnosis not present

## 2018-11-20 DIAGNOSIS — R630 Anorexia: Secondary | ICD-10-CM | POA: Diagnosis not present

## 2018-11-20 LAB — COMPREHENSIVE METABOLIC PANEL
ALT: 14 U/L (ref 0–44)
AST: 32 U/L (ref 15–41)
Albumin: 3.5 g/dL (ref 3.5–5.0)
Alkaline Phosphatase: 105 U/L (ref 38–126)
Anion gap: 13 (ref 5–15)
BUN: 6 mg/dL — ABNORMAL LOW (ref 8–23)
CHLORIDE: 101 mmol/L (ref 98–111)
CO2: 25 mmol/L (ref 22–32)
CREATININE: 0.57 mg/dL (ref 0.44–1.00)
Calcium: 9.3 mg/dL (ref 8.9–10.3)
Glucose, Bld: 124 mg/dL — ABNORMAL HIGH (ref 70–99)
Potassium: 3.7 mmol/L (ref 3.5–5.1)
Sodium: 139 mmol/L (ref 135–145)
TOTAL PROTEIN: 6.9 g/dL (ref 6.5–8.1)
Total Bilirubin: 0.5 mg/dL (ref 0.3–1.2)

## 2018-11-20 LAB — MAGNESIUM: MAGNESIUM: 2.2 mg/dL (ref 1.7–2.4)

## 2018-11-20 LAB — CBC WITH DIFFERENTIAL/PLATELET
ABS IMMATURE GRANULOCYTES: 0.15 10*3/uL — AB (ref 0.00–0.07)
BASOS PCT: 1 %
Basophils Absolute: 0.1 10*3/uL (ref 0.0–0.1)
EOS ABS: 0.2 10*3/uL (ref 0.0–0.5)
Eosinophils Relative: 2 %
HCT: 37.7 % (ref 36.0–46.0)
Hemoglobin: 11.3 g/dL — ABNORMAL LOW (ref 12.0–15.0)
Immature Granulocytes: 2 %
Lymphocytes Relative: 21 %
Lymphs Abs: 1.6 10*3/uL (ref 0.7–4.0)
MCH: 29.4 pg (ref 26.0–34.0)
MCHC: 30 g/dL (ref 30.0–36.0)
MCV: 98.2 fL (ref 80.0–100.0)
MONO ABS: 0.9 10*3/uL (ref 0.1–1.0)
MONOS PCT: 12 %
Neutro Abs: 4.7 10*3/uL (ref 1.7–7.7)
Neutrophils Relative %: 62 %
PLATELETS: 646 10*3/uL — AB (ref 150–400)
RBC: 3.84 MIL/uL — ABNORMAL LOW (ref 3.87–5.11)
RDW: 17.7 % — AB (ref 11.5–15.5)
WBC: 7.7 10*3/uL (ref 4.0–10.5)
nRBC: 0 % (ref 0.0–0.2)

## 2018-11-20 LAB — PHOSPHORUS: PHOSPHORUS: 3.3 mg/dL (ref 2.5–4.6)

## 2018-11-20 MED ORDER — HEPARIN SOD (PORK) LOCK FLUSH 100 UNIT/ML IV SOLN
500.0000 [IU] | Freq: Once | INTRAVENOUS | Status: AC | PRN
  Administered 2018-11-20: 500 [IU]

## 2018-11-20 MED ORDER — DRONABINOL 2.5 MG PO CAPS: 2.5000 mg | ORAL_CAPSULE | Freq: Two times a day (BID) | ORAL | 0 refills | Status: DC

## 2018-11-20 MED ORDER — PROCHLORPERAZINE MALEATE 10 MG PO TABS
10.0000 mg | ORAL_TABLET | Freq: Once | ORAL | Status: AC
  Administered 2018-11-20: 10 mg via ORAL

## 2018-11-20 MED ORDER — SODIUM CHLORIDE 0.9 % IV SOLN
Freq: Once | INTRAVENOUS | Status: AC
  Administered 2018-11-20: 10:00:00 via INTRAVENOUS

## 2018-11-20 MED ORDER — PROCHLORPERAZINE MALEATE 10 MG PO TABS
ORAL_TABLET | ORAL | Status: AC
  Filled 2018-11-20: qty 1

## 2018-11-20 MED ORDER — SODIUM CHLORIDE 0.9 % IV SOLN
470.0000 mg/m2 | Freq: Once | INTRAVENOUS | Status: AC
  Administered 2018-11-20: 800 mg via INTRAVENOUS
  Filled 2018-11-20: qty 20

## 2018-11-20 NOTE — Progress Notes (Signed)
Theresa Vincent, Theresa Vincent [id number]   CLINIC:  Medical Oncology/Hematology  PCP:  Sharilyn Sites, [place] [address] [phone]   REASON FOR VISIT: Follow-up for Stage IVa (T1BN2M1B) poorly differentiated adenocarcinoma of the right lung with extensive brain metastasis  CURRENT THERAPY: Pemetrexed every 3 weeks.  BRIEF ONCOLOGIC HISTORY:    Non-small cell lung cancer, right ([facility])   07/16/2018 Initial Diagnosis    Non-small cell lung cancer, right ([facility])    09/03/2018 -  Chemotherapy    The patient had PEMEtrexed (ALIMTA) 850 mg in sodium chloride 0.9 % 100 mL chemo infusion, 500 mg/m2 = 850 mg, Intravenous,  Once, 4 of 6 cycles Administration: 850 mg (09/05/2018), 800 mg (10/30/2018), 800 mg (11/20/2018), 800 mg (10/09/2018)  for chemotherapy treatment.      Brain metastases ([facility])   08/01/2018 Initial Diagnosis    Brain metastases ([facility])    09/03/2018 -  Chemotherapy    The patient had PEMEtrexed (ALIMTA) 850 mg in sodium chloride 0.9 % 100 mL chemo infusion, 500 mg/m2 = 850 mg, Intravenous,  Once, 4 of 6 cycles Administration: 850 mg (09/05/2018), 800 mg (10/30/2018), 800 mg (11/20/2018), 800 mg (10/09/2018)  for chemotherapy treatment.       INTERVAL HISTORY:  Theresa Vincent 67 y.o. female returns for routine follow-up for non small cell lung cancer. She is here today with her sister and daughter. She is very fatigued and weak. She has no energy to do anything. She report her appetite is decreased and she has lost weight since her last visit. She is unable to drink the boost or ensure. She is still dizzy and has nausea and vomiting everyday. She denies any diarrhea. Denies any new cough or hemoptysis. Denies any new pains. Denies any fevers or recent infections. She reports her appetite at 25% and her energy at 0%.    REVIEW OF SYSTEMS:  Review of Systems  Constitutional: Positive for fatigue.  Gastrointestinal: Positive for  nausea and vomiting.  Neurological: Positive for dizziness and extremity weakness.  All other systems reviewed and are negative.    PAST MEDICAL/SURGICAL HISTORY:  Past Medical History:  Diagnosis Date  . Arthritis    in back   . Cancer ([facility])   . Diverticula of colon   . Diverticulosis   . Hemorrhoid   . Thyroid disease    was hyperthyroid had frozen and now on meds   Past Surgical History:  Procedure Laterality Date  . Broken toe screw in toe    . EYE SURGERY  03/20/16  . IR IMAGING GUIDED PORT INSERTION  09/11/2018  . pt had her bladder stretched in the past     Dr. Michela Pitcher   . radiation thyroid    . WRIST SURGERY Right      SOCIAL HISTORY:  Social History   Socioeconomic History  . Marital status: Legally Separated    Spouse name: Not on file  . Number of children: Not on file  . Years of education: hs   . Highest education level: Not on file  Occupational History    Employer: [employer]eds  . Financial resource strain: Not on file  . Food insecurity:    Worry: Not on file    Inability: Not on file  . Transportation needs:    Medical: Not on file    Non-medical: Not on file  Tobacco Use  . Smoking status: Former Smoker  Packs/day: 0.00    Years: 45.00    Pack years: 0.00  . Smokeless tobacco: Never Used  Substance and Sexual Activity  . Alcohol use: Yes    Comment: rarely   . Drug use: No  . Sexual activity: Not Currently    Birth control/protection: Post-menopausal  Lifestyle  . Physical activity:    Days per week: Not on file    Minutes per session: Not on file  . Stress: Not on file  Relationships  . Social connections:    Talks on phone: Not on file    Gets together: Not on file    Attends religious service: Not on file    Active member of club or organization: Not on file    Attends meetings of clubs or organizations: Not on file    Relationship status: Not on file  . Intimate partner violence:    Fear of current or  ex partner: Not on file    Emotionally abused: Not on file    Physically abused: Not on file    Forced sexual activity: Not on file  Other Topics Concern  . Not on file  Social History Narrative  . Not on file    FAMILY HISTORY:  Family History  Problem Relation Age of Onset  . Diabetes Mother   . Heart disease Father   . Diabetes Father   . Diabetes Sister   . Diabetes Sister   . Cancer Sister        breast    CURRENT MEDICATIONS:  Outpatient Encounter Medications as of 11/20/2018  Medication Sig  . acetaminophen (TYLENOL) 500 MG tablet Take 500 mg by mouth daily as needed for moderate pain or headache.  . dexamethasone (DECADRON) 4 MG tablet Take 1 tab two times a day the day before Alimta chemo. Take 2 tabs the day after chemo, then take 2 tabs two times a day for 2 days.  Marland Kitchen dronabinol (MARINOL) 2.5 MG capsule Take 1 capsule (2.5 mg total) by mouth 2 (two) times daily before lunch and supper.  . escitalopram (LEXAPRO) 10 MG tablet   . feeding supplement, ENSURE ENLIVE, (ENSURE ENLIVE) LIQD Take 237 mLs by mouth 2 (two) times daily between meals.  . folic acid (FOLVITE) 1 MG tablet Take 1 mg by mouth daily.  Marland Kitchen guaiFENesin-dextromethorphan (ROBITUSSIN DM) 100-10 MG/5ML syrup Take 5 mLs by mouth every 4 (four) hours as needed for cough (chest congestion).  . hydrocortisone valerate cream (WESTCORT) 0.2 % Apply topically 2 (two) times daily.  Marland Kitchen ibuprofen (ADVIL,MOTRIN) 200 MG tablet Take 400 mg by mouth every 6 (six) hours as needed (pain / headache).  . lidocaine-prilocaine (EMLA) cream Apply to affected area once (Patient taking differently: Apply 1 application topically daily as needed (local anesthesia). Apply to affected area once)  . Misc. Devices MISC Please supply one wheelchair for patient.  . Multiple Vitamin (MULTIVITAMIN WITH MINERALS) TABS tablet Take 1 tablet by mouth daily.  . ondansetron (ZOFRAN) 8 MG tablet Take 1 tablet (8 mg total) by mouth 2 (two) times daily  as needed (Nausea or vomiting).  . PEMEtrexed Disodium (ALIMTA IV) Inject into the vein every 21 ( twenty-one) days.  . prochlorperazine (COMPAZINE) 10 MG tablet Take 1 tablet (10 mg total) by mouth every 6 (six) hours as needed (Nausea or vomiting).  . promethazine (PHENERGAN) 12.5 MG suppository Place 1 suppository (12.5 mg total) rectally every 6 (six) hours as needed for nausea or vomiting.  . promethazine (PHENERGAN) 12.5  MG tablet Take 1 tablet (12.5 mg total) by mouth every 6 (six) hours as needed for nausea or vomiting.  . pseudoephedrine-acetaminophen (TYLENOL SINUS) 30-500 MG TABS tablet Take 1 tablet by mouth daily as needed (allergies).  . SPIRIVA HANDIHALER 18 MCG inhalation capsule   . VENTOLIN HFA 108 (90 Base) MCG/ACT inhaler   . [EXPIRED] 0.9 %  sodium chloride infusion   . [EXPIRED] heparin lock flush 100 unit/mL   . [EXPIRED] PEMEtrexed (ALIMTA) 800 mg in sodium chloride 0.9 % 100 mL chemo infusion   . prochlorperazine (COMPAZINE) tablet 10 mg    No facility-administered encounter medications on file as of 11/20/2018.     ALLERGIES:  Allergies  Allergen Reactions  . Avelox [Moxifloxacin Hcl In Nacl] Itching     PHYSICAL EXAM:  ECOG Performance status: 1  Vitals:   11/20/18 0922  BP: 108/65  Pulse: 99  Resp: 16  Temp: 97.9 F (36.6 C)  SpO2: 97%   Filed Weights   11/20/18 0922  Weight: 133 lb 8 oz (60.6 kg)    Physical Exam  Constitutional: She is oriented to person, place, and time. She appears well-developed and well-nourished.  Cardiovascular: Normal rate, regular rhythm and normal heart sounds.  Pulmonary/Chest: Effort normal and breath sounds normal.  Musculoskeletal: Normal range of motion.  Neurological: She is alert and oriented to person, place, and time.  Skin: Skin is warm and dry.  Psychiatric: She has a normal mood and affect. Her behavior is normal. Judgment and thought content normal.     LABORATORY DATA:  I have reviewed the labs  as listed.  CBC    Component Value Date/Time   WBC 7.7 11/20/2018 0853   RBC 3.84 (L) 11/20/2018 0853   HGB 11.3 (L) 11/20/2018 0853   HCT 37.7 11/20/2018 0853   PLT 646 (H) 11/20/2018 0853   MCV 98.2 11/20/2018 0853   MCH 29.4 11/20/2018 0853   MCHC 30.0 11/20/2018 0853   RDW 17.7 (H) 11/20/2018 0853   LYMPHSABS 1.6 11/20/2018 0853   MONOABS 0.9 11/20/2018 0853   EOSABS 0.2 11/20/2018 0853   BASOSABS 0.1 11/20/2018 0853   CMP Latest Ref Rng & Units 11/20/2018 10/30/2018 10/09/2018  Glucose 70 - 99 mg/dL 124(H) 131(H) 193(H)  BUN 8 - 23 mg/dL 6(L) 5(L) 8  Creatinine 0.44 - 1.00 mg/dL 0.57 0.48 0.46  Sodium 135 - 145 mmol/L 139 141 137  Potassium 3.5 - 5.1 mmol/L 3.7 4.2 4.2  Chloride 98 - 111 mmol/L 101 102 98  CO2 22 - 32 mmol/L '25 28 29  '$ Calcium 8.9 - 10.3 mg/dL 9.3 8.8(L) 8.8(L)  Total Protein 6.5 - 8.1 g/dL 6.9 7.0 6.3(L)  Total Bilirubin 0.3 - 1.2 mg/dL 0.5 0.5 0.5  Alkaline Phos 38 - 126 U/L 105 118 122  AST 15 - 41 U/L 32 23 27  ALT 0 - 44 U/L '14 15 22       '$ DIAGNOSTIC IMAGING:  I have independently reviewed the scans and discussed with the patient.   I have reviewed Francene Finders, NP's note and agree with the documentation.  I personally performed a face-to-face visit, made revisions and my assessment and plan is as follows.     ASSESSMENT & PLAN:   Non-small cell lung cancer, right ([facility]) 1.  Stage IVa (T1BN2M1B) poorly differentiated adenocarcinoma of the right lung with extensive brain metastasis: Foundation 1 testing shows MS-stable, TMB-intermediate and no other actionable mutations. - Presentation with the 2 to 41-monthhistory of  right submandibular mass, seen by Dr. Benjamine Mola.  Flexible fiberoptic laryngoscopy showed normal examination. - She underwent right submandibular lymph node biopsy on 06/26/2018 which showed metastatic poorly differentiated adenocarcinoma, TTF-1 positive, CK positive. - Patient is a current active smoker, quit 2 weeks ago, smoked  about half pack per day for more than 25 years. -PET CT scan on 07/15/2018 shows hypermetabolic 1.8 cm right upper lobe pulmonary nodule, suspicious for primary bronchus and carcinoma, ipsilateral hypermetabolic mediastinal adenopathy consistent with metastatic disease, 2.3 cm hypermetabolic right submandibular lymph node. - Patient reportedly fell 2 weeks ago and since then has been feeling dizzy and unsteady.  Also has occasional nausea and vomited for the last 2 weeks mostly after eating.  Taking Zofran which occasionally helps. -Brain MRI on 07/24/2018 showed extremely widespread metastatic disease to the brain, more than 40 minutes, largest 2.5 cm. - She received whole brain radiation therapy from 08/02/2018 through 08/15/2018, 30 Gray in 10 fractions. - She received single agent pemetrexed on 09/05/2018. - She was admitted to the hospital with pneumonia, sepsis and COPD exacerbation from 09/15/2018 through 09/23/2018. - Pemetrexed cycle 2 on 10/09/2018, cycle 3 on 10/30/2018. - We reviewed results of the CT scan CAP dated 11/13/2018 showing right upper lobe lung mass which is stable.  Mediastinal and hilar lymphadenopathy is also stable.  No new lesions were seen.  We have also reviewed MRI of the brain which showed improvement in all the lesions. -I have recommended continuation of therapy at this time.  She may proceed with cycle 4 of pemetrexed today. - Intractable nausea: She is having intermittent nausea and vomiting.  She has Phenergan, Compazine and Zofran with her.  Phenergan is making her sleepy.  I have recommended her to start taking Compazine 10 mg twice daily on a standing basis. -She will be seen back in 3 weeks for follow-up.  2.  Family history: -She had one sister who had TNBC at 56.  Another sister had breast cancer at age 12.  Maternal aunt had breast cancer at age 72.  I have recommended her sister to undergo testing for BRCA 1/2 .  3.  Loss of appetite: - She has tried mirtazapine  which did not help. -We will start her on Marinol 2.5 mg twice daily.  We discussed about side effects in detail.      Orders placed this encounter:  Orders Placed This Encounter  Procedures  . Magnesium  . CBC with Differential/Platelet  . Comprehensive metabolic panel  . TREATMENT CONDITIONS      Derek Jack, [place] [phone]

## 2018-11-20 NOTE — Assessment & Plan Note (Signed)
1.  Stage IVa (T1BN2M1B) poorly differentiated adenocarcinoma of the right lung with extensive brain metastasis: Foundation 1 testing shows MS-stable, TMB-intermediate and no other actionable mutations. - Presentation with the 2 to 76-monthhistory of right submandibular mass, seen by Dr. TBenjamine Mola  Flexible fiberoptic laryngoscopy showed normal examination. - She underwent right submandibular lymph node biopsy on 06/26/2018 which showed metastatic poorly differentiated adenocarcinoma, TTF-1 positive, CK positive. - Patient is a current active smoker, quit 2 weeks ago, smoked about half pack per day for more than 25 years. -PET CT scan on 07/15/2018 shows hypermetabolic 1.8 cm right upper lobe pulmonary nodule, suspicious for primary bronchus and carcinoma, ipsilateral hypermetabolic mediastinal adenopathy consistent with metastatic disease, 2.3 cm hypermetabolic right submandibular lymph node. - Patient reportedly fell 2 weeks ago and since then has been feeling dizzy and unsteady.  Also has occasional nausea and vomited for the last 2 weeks mostly after eating.  Taking Zofran which occasionally helps. -Brain MRI on 07/24/2018 showed extremely widespread metastatic disease to the brain, more than 40 minutes, largest 2.5 cm. - She received whole brain radiation therapy from 08/02/2018 through 08/15/2018, 30 Gray in 10 fractions. - She received single agent pemetrexed on 09/05/2018. - She was admitted to the hospital with pneumonia, sepsis and COPD exacerbation from 09/15/2018 through 09/23/2018. - Pemetrexed cycle 2 on 10/09/2018, cycle 3 on 10/30/2018. - We reviewed results of the CT scan CAP dated 11/13/2018 showing right upper lobe lung mass which is stable.  Mediastinal and hilar lymphadenopathy is also stable.  No new lesions were seen.  We have also reviewed MRI of the brain which showed improvement in all the lesions. -I have recommended continuation of therapy at this time.  She may proceed with cycle 4 of  pemetrexed today. - Intractable nausea: She is having intermittent nausea and vomiting.  She has Phenergan, Compazine and Zofran with her.  Phenergan is making her sleepy.  I have recommended her to start taking Compazine 10 mg twice daily on a standing basis. -She will be seen back in 3 weeks for follow-up.  2.  Family history: -She had one sister who had TNBC at 431  Another sister had breast cancer at age 66  Maternal aunt had breast cancer at age 66  I have recommended her sister to undergo testing for BRCA 1/2 .  3.  Loss of appetite: - She has tried mirtazapine which did not help. -We will start her on Marinol 2.5 mg twice daily.  We discussed about side effects in detail.

## 2018-11-20 NOTE — Progress Notes (Signed)
Tolerated infusion w/o adverse reaction.  Alert, in no distress.  Discharged via wheelchair in c/o daughter.

## 2018-11-20 NOTE — Patient Instructions (Addendum)
[facility] at [facility] Discharge Instructions Follow up in 3 weeks with labs and treatment.    Thank you for choosing [facility] at [facility] to provide your oncology and hematology care.  To afford each patient quality time with our provider, please arrive at least 15 minutes before your scheduled appointment time.   If you have a lab appointment with the Cancer Center please come in thru the  Main Entrance and check in at the main information desk  You need to re-schedule your appointment should you arrive 10 or more minutes late.  We strive to give you quality time with our providers, and arriving late affects you and other patients whose appointments are after yours.  Also, if you no show three or more times for appointments you may be dismissed from the clinic at the providers discretion.     Again, thank you for choosing [facility].  Our hope is that these requests will decrease the amount of time that you wait before being seen by our physicians.       _____________________________________________________________  Should you have questions after your visit to [facility], please contact our office at [phone] between the hours of 8:00 a.m. and 4:30 p.m.  Voicemails left after 4:00 p.m. will not be returned until the following business day.  For prescription refill requests, have your pharmacy contact our office and allow 72 hours.    Cancer Center Support Programs:   > Cancer Support Group  2nd Tuesday of the month 1pm-2pm, Journey Room    

## 2018-11-25 DIAGNOSIS — J440 Chronic obstructive pulmonary disease with acute lower respiratory infection: Secondary | ICD-10-CM | POA: Diagnosis not present

## 2018-11-25 DIAGNOSIS — C7931 Secondary malignant neoplasm of brain: Secondary | ICD-10-CM | POA: Diagnosis not present

## 2018-11-25 DIAGNOSIS — C3491 Malignant neoplasm of unspecified part of right bronchus or lung: Secondary | ICD-10-CM | POA: Diagnosis not present

## 2018-11-25 DIAGNOSIS — J9621 Acute and chronic respiratory failure with hypoxia: Secondary | ICD-10-CM | POA: Diagnosis not present

## 2018-11-25 DIAGNOSIS — J181 Lobar pneumonia, unspecified organism: Secondary | ICD-10-CM | POA: Diagnosis not present

## 2018-11-25 DIAGNOSIS — M1990 Unspecified osteoarthritis, unspecified site: Secondary | ICD-10-CM | POA: Diagnosis not present

## 2018-11-26 ENCOUNTER — Encounter (HOSPITAL_COMMUNITY): Payer: Self-pay | Admitting: *Deleted

## 2018-11-26 NOTE — Progress Notes (Signed)
Patient's home health nurse called stating that patient has a knot on her jaw. It is not red and not painful but is tender to the touch. She denies any recent fevers, dental issues, or injury to the area.    I spoke with Dr. Delton Coombes and he states that he will evaluate at patient's upcoming appointment.  Patient is to notify us if she develops any signs of infection in that area.  I left a vm with patient's home health nurse Colletta Maryland and asked that she help to assess the area and report any abnormal findings.  I left contact numbers for both nurse navigator and clinic.

## 2018-11-29 ENCOUNTER — Telehealth (HOSPITAL_COMMUNITY): Payer: Self-pay

## 2018-11-29 NOTE — Telephone Encounter (Signed)
Nutrition  Patient identified on Malnutrition Screening report for poor appetite and weight loss.  Called mobile number listed (preferred) in chart which is daughter's number.  Left message to call RD back.  Cameshia Cressman B. Zenia Resides, [place], [place] Registered Dietitian [phone] (pager)

## 2018-12-03 DIAGNOSIS — C3491 Malignant neoplasm of unspecified part of right bronchus or lung: Secondary | ICD-10-CM | POA: Diagnosis not present

## 2018-12-03 DIAGNOSIS — J181 Lobar pneumonia, unspecified organism: Secondary | ICD-10-CM | POA: Diagnosis not present

## 2018-12-03 DIAGNOSIS — M1990 Unspecified osteoarthritis, unspecified site: Secondary | ICD-10-CM | POA: Diagnosis not present

## 2018-12-03 DIAGNOSIS — C7931 Secondary malignant neoplasm of brain: Secondary | ICD-10-CM | POA: Diagnosis not present

## 2018-12-03 DIAGNOSIS — J9621 Acute and chronic respiratory failure with hypoxia: Secondary | ICD-10-CM | POA: Diagnosis not present

## 2018-12-03 DIAGNOSIS — J440 Chronic obstructive pulmonary disease with acute lower respiratory infection: Secondary | ICD-10-CM | POA: Diagnosis not present

## 2018-12-10 DIAGNOSIS — J440 Chronic obstructive pulmonary disease with acute lower respiratory infection: Secondary | ICD-10-CM | POA: Diagnosis not present

## 2018-12-10 DIAGNOSIS — J9621 Acute and chronic respiratory failure with hypoxia: Secondary | ICD-10-CM | POA: Diagnosis not present

## 2018-12-10 DIAGNOSIS — C7931 Secondary malignant neoplasm of brain: Secondary | ICD-10-CM | POA: Diagnosis not present

## 2018-12-10 DIAGNOSIS — C3491 Malignant neoplasm of unspecified part of right bronchus or lung: Secondary | ICD-10-CM | POA: Diagnosis not present

## 2018-12-10 DIAGNOSIS — M1990 Unspecified osteoarthritis, unspecified site: Secondary | ICD-10-CM | POA: Diagnosis not present

## 2018-12-10 DIAGNOSIS — J181 Lobar pneumonia, unspecified organism: Secondary | ICD-10-CM | POA: Diagnosis not present

## 2018-12-12 ENCOUNTER — Inpatient Hospital Stay (HOSPITAL_BASED_OUTPATIENT_CLINIC_OR_DEPARTMENT_OTHER): Payer: Medicare Other | Admitting: Hematology

## 2018-12-12 ENCOUNTER — Inpatient Hospital Stay (HOSPITAL_COMMUNITY): Payer: Medicare Other | Attending: Hematology

## 2018-12-12 ENCOUNTER — Other Ambulatory Visit: Payer: Self-pay

## 2018-12-12 ENCOUNTER — Encounter (HOSPITAL_COMMUNITY): Payer: Self-pay | Admitting: Hematology

## 2018-12-12 ENCOUNTER — Inpatient Hospital Stay (HOSPITAL_COMMUNITY): Payer: Medicare Other

## 2018-12-12 ENCOUNTER — Ambulatory Visit (HOSPITAL_COMMUNITY)
Admission: RE | Admit: 2018-12-12 | Discharge: 2018-12-12 | Disposition: A | Discharge status: Home / Self Care | Payer: Medicare Other | Source: Ambulatory Visit | Attending: Nurse Practitioner | Admitting: Nurse Practitioner

## 2018-12-12 VITALS — BP 103/66 | HR 94 | Temp 98.0°F | Resp 18 | Wt 124.0 lb

## 2018-12-12 VITALS — BP 114/75 | HR 78 | Temp 98.3°F | Resp 16

## 2018-12-12 DIAGNOSIS — E876 Hypokalemia: Secondary | ICD-10-CM

## 2018-12-12 DIAGNOSIS — C7931 Secondary malignant neoplasm of brain: Secondary | ICD-10-CM

## 2018-12-12 DIAGNOSIS — R1111 Vomiting without nausea: Secondary | ICD-10-CM | POA: Insufficient documentation

## 2018-12-12 DIAGNOSIS — C3411 Malignant neoplasm of upper lobe, right bronchus or lung: Secondary | ICD-10-CM | POA: Insufficient documentation

## 2018-12-12 DIAGNOSIS — Z87891 Personal history of nicotine dependence: Secondary | ICD-10-CM

## 2018-12-12 DIAGNOSIS — C3491 Malignant neoplasm of unspecified part of right bronchus or lung: Secondary | ICD-10-CM

## 2018-12-12 DIAGNOSIS — R634 Abnormal weight loss: Secondary | ICD-10-CM | POA: Diagnosis not present

## 2018-12-12 DIAGNOSIS — Z5111 Encounter for antineoplastic chemotherapy: Secondary | ICD-10-CM | POA: Insufficient documentation

## 2018-12-12 DIAGNOSIS — R05 Cough: Secondary | ICD-10-CM

## 2018-12-12 DIAGNOSIS — R630 Anorexia: Secondary | ICD-10-CM

## 2018-12-12 LAB — CBC WITH DIFFERENTIAL/PLATELET
Abs Immature Granulocytes: 0.12 10*3/uL — ABNORMAL HIGH (ref 0.00–0.07)
BASOS ABS: 0.1 10*3/uL (ref 0.0–0.1)
BASOS PCT: 1 %
EOS ABS: 0.1 10*3/uL (ref 0.0–0.5)
EOS PCT: 1 %
HCT: 39.1 % (ref 36.0–46.0)
Hemoglobin: 11.7 g/dL — ABNORMAL LOW (ref 12.0–15.0)
Immature Granulocytes: 1 %
LYMPHS PCT: 15 %
Lymphs Abs: 1.6 10*3/uL (ref 0.7–4.0)
MCH: 29.4 pg (ref 26.0–34.0)
MCHC: 29.9 g/dL — AB (ref 30.0–36.0)
MCV: 98.2 fL (ref 80.0–100.0)
Monocytes Absolute: 1.3 10*3/uL — ABNORMAL HIGH (ref 0.1–1.0)
Monocytes Relative: 13 %
NRBC: 0 % (ref 0.0–0.2)
Neutro Abs: 7.3 10*3/uL (ref 1.7–7.7)
Neutrophils Relative %: 69 %
PLATELETS: 509 10*3/uL — AB (ref 150–400)
RBC: 3.98 MIL/uL (ref 3.87–5.11)
RDW: 16.7 % — AB (ref 11.5–15.5)
WBC: 10.6 10*3/uL — AB (ref 4.0–10.5)

## 2018-12-12 LAB — COMPREHENSIVE METABOLIC PANEL
ALBUMIN: 3.2 g/dL — AB (ref 3.5–5.0)
ALT: 12 U/L (ref 0–44)
ANION GAP: 15 (ref 5–15)
AST: 33 U/L (ref 15–41)
Alkaline Phosphatase: 112 U/L (ref 38–126)
BUN: 6 mg/dL — ABNORMAL LOW (ref 8–23)
CO2: 25 mmol/L (ref 22–32)
Calcium: 9.1 mg/dL (ref 8.9–10.3)
Chloride: 98 mmol/L (ref 98–111)
Creatinine, Ser: 0.57 mg/dL (ref 0.44–1.00)
GFR calc non Af Amer: >60 mL/min (ref >60)
GLUCOSE: 129 mg/dL — AB (ref 70–99)
POTASSIUM: 2.9 mmol/L — AB (ref 3.5–5.1)
SODIUM: 138 mmol/L (ref 135–145)
Total Bilirubin: 0.2 mg/dL — ABNORMAL LOW (ref 0.3–1.2)
Total Protein: 6.6 g/dL (ref 6.5–8.1)

## 2018-12-12 LAB — MAGNESIUM: Magnesium: 2 mg/dL (ref 1.7–2.4)

## 2018-12-12 MED ORDER — POTASSIUM CHLORIDE 10 MEQ/100ML IV SOLN
10.0000 meq | INTRAVENOUS | Status: AC
  Administered 2018-12-12 (×2): 10 meq via INTRAVENOUS
  Filled 2018-12-12 (×2): qty 100

## 2018-12-12 MED ORDER — SCOPOLAMINE 1 MG/3DAYS TD PT72: 1.0000 | MEDICATED_PATCH | TRANSDERMAL | 3 refills | Status: AC

## 2018-12-12 MED ORDER — HEPARIN SOD (PORK) LOCK FLUSH 100 UNIT/ML IV SOLN
500.0000 [IU] | Freq: Once | INTRAVENOUS | Status: AC | PRN
  Administered 2018-12-12: 500 [IU]
  Filled 2018-12-12: qty 5

## 2018-12-12 MED ORDER — SODIUM CHLORIDE 0.9% FLUSH
10.0000 mL | INTRAVENOUS | Status: DC | PRN
  Administered 2018-12-12: 10 mL
  Filled 2018-12-12: qty 10

## 2018-12-12 MED ORDER — SODIUM CHLORIDE 0.9 % IV SOLN
800.0000 mg | Freq: Once | INTRAVENOUS | Status: AC
  Administered 2018-12-12: 800 mg via INTRAVENOUS
  Filled 2018-12-12: qty 20

## 2018-12-12 MED ORDER — SODIUM CHLORIDE 0.9 % IV SOLN
Freq: Once | INTRAVENOUS | Status: AC
  Administered 2018-12-12: 12:00:00 via INTRAVENOUS

## 2018-12-12 MED ORDER — POTASSIUM CHLORIDE CRYS ER 20 MEQ PO TBCR
40.0000 meq | EXTENDED_RELEASE_TABLET | Freq: Once | ORAL | Status: AC
  Administered 2018-12-12: 40 meq via ORAL
  Filled 2018-12-12: qty 2

## 2018-12-12 MED ORDER — PROCHLORPERAZINE MALEATE 10 MG PO TABS
10.0000 mg | ORAL_TABLET | Freq: Once | ORAL | Status: AC
  Administered 2018-12-12: 10 mg via ORAL
  Filled 2018-12-12: qty 1

## 2018-12-12 MED ORDER — LEVOFLOXACIN 500 MG PO TABS: 500.0000 mg | ORAL_TABLET | Freq: Every day | ORAL | 0 refills | Status: AC

## 2018-12-12 NOTE — Patient Instructions (Signed)
[facility] at [facility] Discharge Instructions     Thank you for choosing [facility] at [facility] to provide your oncology and hematology care.  To afford each patient quality time with our provider, please arrive at least 15 minutes before your scheduled appointment time.   If you have a lab appointment with the Cancer Center please come in thru the  Main Entrance and check in at the main information desk  You need to re-schedule your appointment should you arrive 10 or more minutes late.  We strive to give you quality time with our providers, and arriving late affects you and other patients whose appointments are after yours.  Also, if you no show three or more times for appointments you may be dismissed from the clinic at the providers discretion.     Again, thank you for choosing [facility].  Our hope is that these requests will decrease the amount of time that you wait before being seen by our physicians.       _____________________________________________________________  Should you have questions after your visit to [facility], please contact our office at [phone] between the hours of 8:00 a.m. and 4:30 p.m.  Voicemails left after 4:00 p.m. will not be returned until the following business day.  For prescription refill requests, have your pharmacy contact our office and allow 72 hours.    Cancer Center Support Programs:   > Cancer Support Group  2nd Tuesday of the month 1pm-2pm, Journey Room    

## 2018-12-12 NOTE — Progress Notes (Signed)
1120 Lab and CXR results reviewed with Dr. Delton Coombes and pt approved for Alimta infusion today with 52meq Potassium IV and 40 meq Potassium PO ordered to be given as well per MD  Evorn Gong tolerated Alimta infusion and Potassium infusions well without complaints or incident. VSS upon discharge. Pt discharged via wheelchair in satisfactory condition accompanied by family member

## 2018-12-12 NOTE — Assessment & Plan Note (Signed)
1.  Stage IVa (T1BN2M1B) poorly differentiated adenocarcinoma of the right lung with extensive brain metastasis: Foundation 1 testing shows MS-stable, TMB-intermediate and no other actionable mutations. - Presentation with the 2 to 6-monthhistory of right submandibular mass, seen by Dr. TBenjamine Mola  Flexible fiberoptic laryngoscopy showed normal examination. - She underwent right submandibular lymph node biopsy on 06/26/2018 which showed metastatic poorly differentiated adenocarcinoma, TTF-1 positive, CK positive. - Patient is a current active smoker, quit 2 weeks ago, smoked about half pack per day for more than 25 years. -PET CT scan on 07/15/2018 shows hypermetabolic 1.8 cm right upper lobe pulmonary nodule, suspicious for primary bronchus and carcinoma, ipsilateral hypermetabolic mediastinal adenopathy consistent with metastatic disease, 2.3 cm hypermetabolic right submandibular lymph node. - Patient reportedly fell 2 weeks ago and since then has been feeling dizzy and unsteady.  Also has occasional nausea and vomited for the last 2 weeks mostly after eating.  Taking Zofran which occasionally helps. -Brain MRI on 07/24/2018 showed extremely widespread metastatic disease to the brain, more than 40 minutes, largest 2.5 cm. - She received whole brain radiation therapy from 08/02/2018 through 08/15/2018, 30 Gray in 10 fractions. - 4 cycles of pemetrexed from 09/05/2018 through 11/20/2018.   - She was admitted to the hospital with pneumonia, sepsis and COPD exacerbation from 09/15/2018 through 09/23/2018. - CT CAP on 11/13/2018 shows right upper lobe lung mass stable.  Mediastinal and hilar adenopathy is also stable.  No new lesions were seen.  MRI of the brain showed improvement of her lesions. - She complained of cough in the last 1 week.  She also has greenish expectoration.  I have done a chest x-ray in our office today.  No changes consistent with pneumonia.  Most likely bronchitis.  We will give her Levaquin for 5  days.  Right submandibular lymph node is more prominent due to weight loss. - She may proceed with her cycle 5 today.  We will see her back in 3 weeks for follow-up.  2.  Family history: -She had one sister who had TNBC at 460  Another sister had breast cancer at age [id number]  Maternal aunt had breast cancer at age 66  I have recommended her sister to undergo testing for BRCA 1/2 .  3.  Loss of appetite: - She tried mirtazapine without help. -We will start her on Marinol 2.5 mg twice daily which is helping.  4.  Intractable nausea/vomiting: - She is using Compazine 3 times a day.  She is also taking Zofran 4 times a day.  Still she is throwing up. - We will start her on scopolamine patch. -I will also start her on dexamethasone 4 mg in the mornings.

## 2018-12-12 NOTE — Patient Instructions (Signed)
[facility] Discharge Instructions for Patients Receiving Chemotherapy   Beginning January 23rd 2017 lab work for the [facility] will be done in the  Main lab at [facility] on 1st floor. If you have a lab appointment with the [facility] please come in thru the  Main Entrance and check in at the main information desk   Today you received the following chemotherapy agents Alimta as well as Potassium IV and PO. Follow-up as scheduled. Call clinic for any questions or concerns  To help prevent nausea and vomiting after your treatment, we encourage you to take your nausea medication   If you develop nausea and vomiting, or diarrhea that is not controlled by your medication, call the clinic.  The clinic phone number is [phone]. Office hours are Monday-Friday 8:30am-5:00pm.  BELOW ARE SYMPTOMS THAT SHOULD BE REPORTED IMMEDIATELY:  *FEVER GREATER THAN 101.0 F  *CHILLS WITH OR WITHOUT FEVER  NAUSEA AND VOMITING THAT IS NOT CONTROLLED WITH YOUR NAUSEA MEDICATION  *UNUSUAL SHORTNESS OF BREATH  *UNUSUAL BRUISING OR BLEEDING  TENDERNESS IN MOUTH AND THROAT WITH OR WITHOUT PRESENCE OF ULCERS  *URINARY PROBLEMS  *BOWEL PROBLEMS  UNUSUAL RASH Items with * indicate a potential emergency and should be followed up as soon as possible. If you have an emergency after office hours please contact your primary care physician or go to the nearest emergency department.  Please call the clinic during office hours if you have any questions or concerns.   You may also contact the Patient Navigator at [phone] should you have any questions or need assistance in obtaining follow up care.      Resources For Cancer Patients and their Caregivers ? American Cancer Society: Can assist with transportation, wigs, general needs, runs Look Good Feel Better.        [phone] ? Cancer Care: Provides financial assistance, online support groups, medication/co-pay  assistance.  1-800-813-HOPE 7798486960) ? [place] Assists [place] cancer patients and their families through emotional , educational and financial support.  (859)300-4753 ? [place] DSS Where to apply for food stamps, Medicaid and utility assistance. (937) 491-8932 ? [facility]: Transportation to medical appointments. 380-465-6155 ? Social Security Administration: May apply for disability if have a Stage IV cancer. 458-400-6620 413-131-3088 ? [employer], Disability and Transit Services: Assists with nutrition, care and transit needs. [phone]

## 2018-12-12 NOTE — Progress Notes (Signed)
[place], [place] [id number]   CLINIC:  Medical Oncology/Hematology  PCP:  Sharilyn Sites, [place] [address] [phone]   REASON FOR VISIT: Follow-up for Stage IVa (T1BN2M1B) poorly differentiated adenocarcinoma of the right lung with extensive brain metastasis  CURRENT THERAPY: Pemetrexed(ALIMTA) every 3 weeks  BRIEF ONCOLOGIC HISTORY:    Non-small cell lung cancer, right ([place])   07/16/2018 Initial Diagnosis    Non-small cell lung cancer, right ([place])    09/05/2018 -  Chemotherapy    The patient had PEMEtrexed (ALIMTA) 850 mg in sodium chloride 0.9 % 100 mL chemo infusion, 500 mg/m2 = 850 mg, Intravenous,  Once, 5 of 6 cycles Administration: 850 mg (09/05/2018), 800 mg (10/30/2018), 800 mg (11/20/2018), 800 mg (10/09/2018)  for chemotherapy treatment.      Brain metastases ([place])   08/01/2018 Initial Diagnosis    Brain metastases ([place])    09/05/2018 -  Chemotherapy    The patient had PEMEtrexed (ALIMTA) 850 mg in sodium chloride 0.9 % 100 mL chemo infusion, 500 mg/m2 = 850 mg, Intravenous,  Once, 5 of 6 cycles Administration: 850 mg (09/05/2018), 800 mg (10/30/2018), 800 mg (11/20/2018), 800 mg (10/09/2018)  for chemotherapy treatment.      INTERVAL HISTORY:  Ms. Bevill 67 y.o. female returns for routine follow-up for non small cell lung cancer. Patient is here today with her family. She is still having issues with vomiting multiple times a day. She is having a productive cough that is green. She has lost 10 pounds in the last few weeks. Denies any diarrhea. Denies any new pains. Had not noticed any recent bleeding such as epistaxis, hematuria or hematochezia. Denies recent chest pain on exertion, pre-syncopal episodes, or palpitations. Denies any numbness or tingling in hands or feet. Denies any recent fevers or recent hospitalizations. She reports having no appetite or energy level.     REVIEW OF SYSTEMS:  Review  of Systems  HENT:   Positive for hearing loss.   Gastrointestinal: Positive for constipation, diarrhea, nausea and vomiting.  Psychiatric/Behavioral: Positive for sleep disturbance.  All other systems reviewed and are negative.    PAST MEDICAL/SURGICAL HISTORY:  Past Medical History:  Diagnosis Date  . Arthritis    in back   . Cancer ([place])   . Diverticula of colon   . Diverticulosis   . Hemorrhoid   . Thyroid disease    was hyperthyroid had frozen and now on meds   Past Surgical History:  Procedure Laterality Date  . Broken toe screw in toe    . EYE SURGERY  03/20/16  . IR IMAGING GUIDED PORT INSERTION  09/11/2018  . pt had her bladder stretched in the past     Dr. Michela Pitcher   . radiation thyroid    . WRIST SURGERY Right      SOCIAL HISTORY:  Social History   Socioeconomic History  . Marital status: Legally Separated    Spouse name: Not on file  . Number of children: Not on file  . Years of education: hs   . Highest education level: Not on file  Occupational History    Employer: [employer]  . Financial resource strain: Not on file  . Food insecurity:    Worry: Not on file    Inability: Not on file  . Transportation needs:    Medical: Not on file    Non-medical: Not on file  Tobacco Use  .  Smoking status: Former Smoker    Packs/day: 0.00    Years: 45.00    Pack years: 0.00  . Smokeless tobacco: Never Used  Substance and Sexual Activity  . Alcohol use: Yes    Comment: rarely   . Drug use: No  . Sexual activity: Not Currently    Birth control/protection: Post-menopausal  Lifestyle  . Physical activity:    Days per week: Not on file    Minutes per session: Not on file  . Stress: Not on file  Relationships  . Social connections:    Talks on phone: Not on file    Gets together: Not on file    Attends religious service: Not on file    Active member of club or organization: Not on file    Attends meetings of clubs or organizations: Not  on file    Relationship status: Not on file  . Intimate partner violence:    Fear of current or ex partner: Not on file    Emotionally abused: Not on file    Physically abused: Not on file    Forced sexual activity: Not on file  Other Topics Concern  . Not on file  Social History Narrative  . Not on file    FAMILY HISTORY:  Family History  Problem Relation Age of Onset  . Diabetes Mother   . Heart disease Father   . Diabetes Father   . Diabetes Sister   . Diabetes Sister   . Cancer Sister        breast    CURRENT MEDICATIONS:  Outpatient Encounter Medications as of 12/12/2018  Medication Sig  . acetaminophen (TYLENOL) 500 MG tablet Take 500 mg by mouth daily as needed for moderate pain or headache.  . dexamethasone (DECADRON) 4 MG tablet Take 1 tab two times a day the day before Alimta chemo. Take 2 tabs the day after chemo, then take 2 tabs two times a day for 2 days.  Marland Kitchen dronabinol (MARINOL) 2.5 MG capsule Take 1 capsule (2.5 mg total) by mouth 2 (two) times daily before lunch and supper.  . escitalopram (LEXAPRO) 10 MG tablet   . feeding supplement, ENSURE ENLIVE, (ENSURE ENLIVE) LIQD Take 237 mLs by mouth 2 (two) times daily between meals.  . folic acid (FOLVITE) 1 MG tablet Take 1 mg by mouth daily.  Marland Kitchen guaiFENesin-dextromethorphan (ROBITUSSIN DM) 100-10 MG/5ML syrup Take 5 mLs by mouth every 4 (four) hours as needed for cough (chest congestion).  . hydrocortisone valerate cream (WESTCORT) 0.2 % Apply topically 2 (two) times daily.  Marland Kitchen ibuprofen (ADVIL,MOTRIN) 200 MG tablet Take 400 mg by mouth every 6 (six) hours as needed (pain / headache).  Marland Kitchen levothyroxine (SYNTHROID, LEVOTHROID) 25 MCG tablet   . lidocaine-prilocaine (EMLA) cream Apply to affected area once (Patient taking differently: Apply 1 application topically daily as needed (local anesthesia). Apply to affected area once)  . Misc. Devices MISC Please supply one wheelchair for patient.  . Multiple Vitamin  (MULTIVITAMIN WITH MINERALS) TABS tablet Take 1 tablet by mouth daily.  . ondansetron (ZOFRAN) 8 MG tablet Take 1 tablet (8 mg total) by mouth 2 (two) times daily as needed (Nausea or vomiting).  . PEMEtrexed Disodium (ALIMTA IV) Inject into the vein every 21 ( twenty-one) days.  . prochlorperazine (COMPAZINE) 10 MG tablet Take 1 tablet (10 mg total) by mouth every 6 (six) hours as needed (Nausea or vomiting).  . promethazine (PHENERGAN) 12.5 MG suppository Place 1 suppository (12.5 mg total)  rectally every 6 (six) hours as needed for nausea or vomiting.  . promethazine (PHENERGAN) 12.5 MG tablet Take 1 tablet (12.5 mg total) by mouth every 6 (six) hours as needed for nausea or vomiting.  . pseudoephedrine-acetaminophen (TYLENOL SINUS) 30-500 MG TABS tablet Take 1 tablet by mouth daily as needed (allergies).  . SPIRIVA HANDIHALER 18 MCG inhalation capsule   . VENTOLIN HFA 108 (90 Base) MCG/ACT inhaler   . scopolamine (TRANSDERM-SCOP, 1.5 MG,) 1 MG/3DAYS Place 1 patch (1.5 mg total) onto the skin every 3 (three) days.   No facility-administered encounter medications on file as of 12/12/2018.     ALLERGIES:  Allergies  Allergen Reactions  . Avelox [Moxifloxacin Hcl In Nacl] Itching     PHYSICAL EXAM:  ECOG Performance status: 1  Vitals:   12/12/18 0852  BP: 103/66  Pulse: 94  Resp: 18  Temp: 98 F (36.7 C)  SpO2: 96%   Filed Weights   12/12/18 0852  Weight: 124 lb (56.2 kg)    Physical Exam Constitutional:      Appearance: Normal appearance. She is normal weight.  Cardiovascular:     Rate and Rhythm: Normal rate and regular rhythm.     Heart sounds: Normal heart sounds.  Pulmonary:     Effort: Pulmonary effort is normal.     Breath sounds: Normal breath sounds.  Musculoskeletal:     Comments: Wheelchair   Skin:    General: Skin is warm and dry.  Neurological:     Mental Status: She is alert and oriented to person, place, and time. Mental status is at baseline.    Psychiatric:        Mood and Affect: Mood normal.        Behavior: Behavior normal.        Thought Content: Thought content normal.        Judgment: Judgment normal.      LABORATORY DATA:  I have reviewed the labs as listed.  CBC    Component Value Date/Time   WBC 10.6 (H) 12/12/2018 0812   RBC 3.98 12/12/2018 0812   HGB 11.7 (L) 12/12/2018 0812   HCT 39.1 12/12/2018 0812   PLT 509 (H) 12/12/2018 0812   MCV 98.2 12/12/2018 0812   MCH 29.4 12/12/2018 0812   MCHC 29.9 (L) 12/12/2018 0812   RDW 16.7 (H) 12/12/2018 0812   LYMPHSABS 1.6 12/12/2018 0812   MONOABS 1.3 (H) 12/12/2018 0812   EOSABS 0.1 12/12/2018 0812   BASOSABS 0.1 12/12/2018 0812   CMP Latest Ref Rng & Units 12/12/2018 11/20/2018 10/30/2018  Glucose 70 - 99 mg/dL 129(H) 124(H) 131(H)  BUN 8 - 23 mg/dL 6(L) 6(L) 5(L)  Creatinine 0.44 - 1.00 mg/dL 0.57 0.57 0.48  Sodium 135 - 145 mmol/L 138 139 141  Potassium 3.5 - 5.1 mmol/L 2.9(L) 3.7 4.2  Chloride 98 - 111 mmol/L 98 101 102  CO2 22 - 32 mmol/L _0 Calcium 8.9 - 10.3 mg/dL 9.1 9.3 8.8(L)  Total Protein 6.5 - 8.1 g/dL 6.6 6.9 7.0  Total Bilirubin 0.3 - 1.2 mg/dL 0.2(L) 0.5 0.5  Alkaline Phos 38 - 126 U/L 112 105 118  AST 15 - 41 U/L 33 32 23  ALT 0 - 44 U/L _1 DIAGNOSTIC IMAGING:  I have independently reviewed the scans and discussed with the patient.   I have reviewed Francene Finders, NP's note and agree with the documentation.  I personally performed  a face-to-face visit, made revisions and my assessment and plan is as follows.    ASSESSMENT & PLAN:   Non-small cell lung cancer, right (H[facility]) 1.  Stage IVa (T1BN2M1B) poorly differentiated adenocarcinoma of the right lung with extensive brain metastasis: Foundation 1 testing shows MS-stable, TMB-intermediate and no other actionable mutations. - Presentation with the 2 to 35-monthhistory of right submandibular mass, seen by Dr. TBenjamine Mola  Flexible fiberoptic laryngoscopy showed normal  examination. - She underwent right submandibular lymph node biopsy on 06/26/2018 which showed metastatic poorly differentiated adenocarcinoma, TTF-1 positive, CK positive. - Patient is a current active smoker, quit 2 weeks ago, smoked about half pack per day for more than 25 years. -PET CT scan on 07/15/2018 shows hypermetabolic 1.8 cm right upper lobe pulmonary nodule, suspicious for primary bronchus and carcinoma, ipsilateral hypermetabolic mediastinal adenopathy consistent with metastatic disease, 2.3 cm hypermetabolic right submandibular lymph node. - Patient reportedly fell 2 weeks ago and since then has been feeling dizzy and unsteady.  Also has occasional nausea and vomited for the last 2 weeks mostly after eating.  Taking Zofran which occasionally helps. -Brain MRI on 07/24/2018 showed extremely widespread metastatic disease to the brain, more than 40 minutes, largest 2.5 cm. - She received whole brain radiation therapy from 08/02/2018 through 08/15/2018, 30 Gray in 10 fractions. - 4 cycles of pemetrexed from 09/05/2018 through 11/20/2018.   - She was admitted to the hospital with pneumonia, sepsis and COPD exacerbation from 09/15/2018 through 09/23/2018. - CT CAP on 11/13/2018 shows right upper lobe lung mass stable.  Mediastinal and hilar adenopathy is also stable.  No new lesions were seen.  MRI of the brain showed improvement of her lesions. - She complained of cough in the last 1 week.  She also has greenish expectoration.  I have done a chest x-ray in our office today.  No changes consistent with pneumonia.  Most likely bronchitis.  We will give her Levaquin for 5 days.  Right submandibular lymph node is more prominent due to weight loss. - She may proceed with her cycle 5 today.  We will see her back in 3 weeks for follow-up.  2.  Family history: -She had one sister who had TNBC at 449  Another sister had breast cancer at age 66  Maternal aunt had breast cancer at age 66  I have recommended her  sister to undergo testing for BRCA 1/2 .  3.  Loss of appetite: - She tried mirtazapine without help. -We will start her on Marinol 2.5 mg twice daily which is helping.  4.  Intractable nausea/vomiting: - She is using Compazine 3 times a day.  She is also taking Zofran 4 times a day.  Still she is throwing up. - We will start her on scopolamine patch. -I will also start her on dexamethasone 4 mg in the mornings.      Orders placed this encounter:  Orders Placed This Encounter  Procedures  . DG Chest 2 View  . TSH      SDerek Jack MEl Rancho [phone]

## 2018-12-15 DIAGNOSIS — R112 Nausea with vomiting, unspecified: Secondary | ICD-10-CM | POA: Diagnosis not present

## 2018-12-15 DIAGNOSIS — Z9981 Dependence on supplemental oxygen: Secondary | ICD-10-CM | POA: Diagnosis not present

## 2018-12-15 DIAGNOSIS — Z79899 Other long term (current) drug therapy: Secondary | ICD-10-CM | POA: Diagnosis not present

## 2018-12-15 DIAGNOSIS — J449 Chronic obstructive pulmonary disease, unspecified: Secondary | ICD-10-CM | POA: Diagnosis not present

## 2018-12-15 DIAGNOSIS — Z87891 Personal history of nicotine dependence: Secondary | ICD-10-CM | POA: Diagnosis not present

## 2018-12-15 DIAGNOSIS — Z923 Personal history of irradiation: Secondary | ICD-10-CM | POA: Diagnosis not present

## 2018-12-15 DIAGNOSIS — C7931 Secondary malignant neoplasm of brain: Secondary | ICD-10-CM | POA: Diagnosis not present

## 2018-12-15 DIAGNOSIS — Z8701 Personal history of pneumonia (recurrent): Secondary | ICD-10-CM | POA: Diagnosis not present

## 2018-12-15 DIAGNOSIS — J9621 Acute and chronic respiratory failure with hypoxia: Secondary | ICD-10-CM | POA: Diagnosis not present

## 2018-12-15 DIAGNOSIS — M1990 Unspecified osteoarthritis, unspecified site: Secondary | ICD-10-CM | POA: Diagnosis not present

## 2018-12-15 DIAGNOSIS — C3491 Malignant neoplasm of unspecified part of right bronchus or lung: Secondary | ICD-10-CM | POA: Diagnosis not present

## 2018-12-20 ENCOUNTER — Other Ambulatory Visit (HOSPITAL_COMMUNITY): Payer: Self-pay | Admitting: *Deleted

## 2018-12-20 DIAGNOSIS — C7931 Secondary malignant neoplasm of brain: Secondary | ICD-10-CM | POA: Diagnosis not present

## 2018-12-20 DIAGNOSIS — C3491 Malignant neoplasm of unspecified part of right bronchus or lung: Secondary | ICD-10-CM | POA: Diagnosis not present

## 2018-12-20 DIAGNOSIS — J449 Chronic obstructive pulmonary disease, unspecified: Secondary | ICD-10-CM | POA: Diagnosis not present

## 2018-12-20 DIAGNOSIS — J9621 Acute and chronic respiratory failure with hypoxia: Secondary | ICD-10-CM | POA: Diagnosis not present

## 2018-12-20 DIAGNOSIS — Z8701 Personal history of pneumonia (recurrent): Secondary | ICD-10-CM | POA: Diagnosis not present

## 2018-12-20 DIAGNOSIS — R112 Nausea with vomiting, unspecified: Secondary | ICD-10-CM | POA: Diagnosis not present

## 2018-12-20 MED ORDER — PROCHLORPERAZINE MALEATE 10 MG PO TABS: 10.0000 mg | ORAL_TABLET | Freq: Four times a day (QID) | ORAL | 3 refills | Status: AC | PRN

## 2018-12-23 DIAGNOSIS — C7931 Secondary malignant neoplasm of brain: Secondary | ICD-10-CM | POA: Diagnosis not present

## 2018-12-23 DIAGNOSIS — J449 Chronic obstructive pulmonary disease, unspecified: Secondary | ICD-10-CM | POA: Diagnosis not present

## 2018-12-23 DIAGNOSIS — C3491 Malignant neoplasm of unspecified part of right bronchus or lung: Secondary | ICD-10-CM | POA: Diagnosis not present

## 2018-12-23 DIAGNOSIS — J9621 Acute and chronic respiratory failure with hypoxia: Secondary | ICD-10-CM | POA: Diagnosis not present

## 2018-12-27 DIAGNOSIS — J9621 Acute and chronic respiratory failure with hypoxia: Secondary | ICD-10-CM | POA: Diagnosis not present

## 2018-12-27 DIAGNOSIS — Z8701 Personal history of pneumonia (recurrent): Secondary | ICD-10-CM | POA: Diagnosis not present

## 2018-12-27 DIAGNOSIS — J449 Chronic obstructive pulmonary disease, unspecified: Secondary | ICD-10-CM | POA: Diagnosis not present

## 2018-12-27 DIAGNOSIS — C3491 Malignant neoplasm of unspecified part of right bronchus or lung: Secondary | ICD-10-CM | POA: Diagnosis not present

## 2018-12-27 DIAGNOSIS — C7931 Secondary malignant neoplasm of brain: Secondary | ICD-10-CM | POA: Diagnosis not present

## 2018-12-27 DIAGNOSIS — R112 Nausea with vomiting, unspecified: Secondary | ICD-10-CM | POA: Diagnosis not present

## 2019-01-01 DIAGNOSIS — R112 Nausea with vomiting, unspecified: Secondary | ICD-10-CM | POA: Diagnosis not present

## 2019-01-01 DIAGNOSIS — C7931 Secondary malignant neoplasm of brain: Secondary | ICD-10-CM | POA: Diagnosis not present

## 2019-01-01 DIAGNOSIS — J9621 Acute and chronic respiratory failure with hypoxia: Secondary | ICD-10-CM | POA: Diagnosis not present

## 2019-01-01 DIAGNOSIS — C3491 Malignant neoplasm of unspecified part of right bronchus or lung: Secondary | ICD-10-CM | POA: Diagnosis not present

## 2019-01-01 DIAGNOSIS — Z8701 Personal history of pneumonia (recurrent): Secondary | ICD-10-CM | POA: Diagnosis not present

## 2019-01-01 DIAGNOSIS — J449 Chronic obstructive pulmonary disease, unspecified: Secondary | ICD-10-CM | POA: Diagnosis not present

## 2019-01-02 ENCOUNTER — Inpatient Hospital Stay (HOSPITAL_BASED_OUTPATIENT_CLINIC_OR_DEPARTMENT_OTHER): Payer: Medicare Other | Admitting: Hematology

## 2019-01-02 ENCOUNTER — Encounter (HOSPITAL_COMMUNITY): Payer: Self-pay | Admitting: Hematology

## 2019-01-02 ENCOUNTER — Other Ambulatory Visit: Payer: Self-pay

## 2019-01-02 ENCOUNTER — Inpatient Hospital Stay (HOSPITAL_COMMUNITY): Payer: Medicare Other

## 2019-01-02 ENCOUNTER — Inpatient Hospital Stay (HOSPITAL_COMMUNITY): Payer: Medicare Other | Attending: Hematology

## 2019-01-02 VITALS — BP 105/58 | HR 80 | Temp 97.7°F | Resp 18

## 2019-01-02 VITALS — BP 116/75 | HR 79 | Temp 97.8°F | Resp 16 | Wt 126.4 lb

## 2019-01-02 DIAGNOSIS — C3411 Malignant neoplasm of upper lobe, right bronchus or lung: Secondary | ICD-10-CM | POA: Insufficient documentation

## 2019-01-02 DIAGNOSIS — C3491 Malignant neoplasm of unspecified part of right bronchus or lung: Secondary | ICD-10-CM

## 2019-01-02 DIAGNOSIS — R112 Nausea with vomiting, unspecified: Secondary | ICD-10-CM | POA: Insufficient documentation

## 2019-01-02 DIAGNOSIS — E032 Hypothyroidism due to medicaments and other exogenous substances: Secondary | ICD-10-CM

## 2019-01-02 DIAGNOSIS — R630 Anorexia: Secondary | ICD-10-CM | POA: Diagnosis not present

## 2019-01-02 DIAGNOSIS — C7931 Secondary malignant neoplasm of brain: Secondary | ICD-10-CM

## 2019-01-02 DIAGNOSIS — Z5111 Encounter for antineoplastic chemotherapy: Secondary | ICD-10-CM | POA: Insufficient documentation

## 2019-01-02 DIAGNOSIS — Z79899 Other long term (current) drug therapy: Secondary | ICD-10-CM | POA: Insufficient documentation

## 2019-01-02 DIAGNOSIS — C7951 Secondary malignant neoplasm of bone: Secondary | ICD-10-CM | POA: Diagnosis not present

## 2019-01-02 LAB — CBC WITH DIFFERENTIAL/PLATELET
Abs Immature Granulocytes: 0.08 10*3/uL — ABNORMAL HIGH (ref 0.00–0.07)
Basophils Absolute: 0 10*3/uL (ref 0.0–0.1)
Basophils Relative: 1 %
EOS ABS: 0 10*3/uL (ref 0.0–0.5)
Eosinophils Relative: 1 %
HEMATOCRIT: 38.3 % (ref 36.0–46.0)
Hemoglobin: 11.5 g/dL — ABNORMAL LOW (ref 12.0–15.0)
Immature Granulocytes: 1 %
Lymphocytes Relative: 17 %
Lymphs Abs: 0.9 10*3/uL (ref 0.7–4.0)
MCH: 28.8 pg (ref 26.0–34.0)
MCHC: 30 g/dL (ref 30.0–36.0)
MCV: 96 fL (ref 80.0–100.0)
Monocytes Absolute: 0.8 10*3/uL (ref 0.1–1.0)
Monocytes Relative: 14 %
NEUTROS PCT: 66 %
Neutro Abs: 3.7 10*3/uL (ref 1.7–7.7)
Platelets: 540 10*3/uL — ABNORMAL HIGH (ref 150–400)
RBC: 3.99 MIL/uL (ref 3.87–5.11)
RDW: 16.8 % — ABNORMAL HIGH (ref 11.5–15.5)
WBC: 5.5 10*3/uL (ref 4.0–10.5)
nRBC: 0 % (ref 0.0–0.2)

## 2019-01-02 LAB — COMPREHENSIVE METABOLIC PANEL
ALT: 12 U/L (ref 0–44)
AST: 43 U/L — AB (ref 15–41)
Albumin: 3.4 g/dL — ABNORMAL LOW (ref 3.5–5.0)
Alkaline Phosphatase: 130 U/L — ABNORMAL HIGH (ref 38–126)
Anion gap: 12 (ref 5–15)
BUN: <5 mg/dL — ABNORMAL LOW (ref 8–23)
CO2: 27 mmol/L (ref 22–32)
Calcium: 9.1 mg/dL (ref 8.9–10.3)
Chloride: 101 mmol/L (ref 98–111)
Creatinine, Ser: 0.52 mg/dL (ref 0.44–1.00)
GFR calc Af Amer: >60 mL/min (ref >60)
GFR calc non Af Amer: >60 mL/min (ref >60)
GLUCOSE: 139 mg/dL — AB (ref 70–99)
POTASSIUM: 3.3 mmol/L — AB (ref 3.5–5.1)
Sodium: 140 mmol/L (ref 135–145)
Total Bilirubin: 0.4 mg/dL (ref 0.3–1.2)
Total Protein: 7 g/dL (ref 6.5–8.1)

## 2019-01-02 LAB — TSH: TSH: 9.323 u[IU]/mL — ABNORMAL HIGH (ref 0.350–4.500)

## 2019-01-02 MED ORDER — PROCHLORPERAZINE MALEATE 10 MG PO TABS
10.0000 mg | ORAL_TABLET | Freq: Once | ORAL | Status: AC
  Administered 2019-01-02: 10 mg via ORAL
  Filled 2019-01-02: qty 1

## 2019-01-02 MED ORDER — SODIUM CHLORIDE 0.9% FLUSH
10.0000 mL | INTRAVENOUS | Status: DC | PRN
  Administered 2019-01-02: 10 mL
  Filled 2019-01-02: qty 10

## 2019-01-02 MED ORDER — SODIUM CHLORIDE 0.9 % IV SOLN
515.0000 mg/m2 | Freq: Once | INTRAVENOUS | Status: AC
  Administered 2019-01-02: 800 mg via INTRAVENOUS
  Filled 2019-01-02: qty 20

## 2019-01-02 MED ORDER — CYANOCOBALAMIN 1000 MCG/ML IJ SOLN
1000.0000 ug | Freq: Once | INTRAMUSCULAR | Status: AC
  Administered 2019-01-02: 1000 ug via INTRAMUSCULAR
  Filled 2019-01-02: qty 1

## 2019-01-02 MED ORDER — HEPARIN SOD (PORK) LOCK FLUSH 100 UNIT/ML IV SOLN
500.0000 [IU] | Freq: Once | INTRAVENOUS | Status: AC | PRN
  Administered 2019-01-02: 500 [IU]
  Filled 2019-01-02: qty 5

## 2019-01-02 MED ORDER — POTASSIUM CHLORIDE CRYS ER 20 MEQ PO TBCR
20.0000 meq | EXTENDED_RELEASE_TABLET | Freq: Once | ORAL | Status: AC
  Administered 2019-01-02: 20 meq via ORAL
  Filled 2019-01-02: qty 1

## 2019-01-02 MED ORDER — SODIUM CHLORIDE 0.9 % IV SOLN
Freq: Once | INTRAVENOUS | Status: AC
  Administered 2019-01-02: 10:00:00 via INTRAVENOUS

## 2019-01-02 NOTE — Progress Notes (Signed)
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      Brain metastases ([place] [facility])   08/01/2018 Initial Diagnosis    Brain metastases ([facility])    09/05/2018 -  Chemotherapy    The patient had PEMEtrexed (ALIMTA) 850 mg in sodium chloride 0.9 % 100 mL chemo infusion, 500 mg/m2 = 850 mg, Intravenous,  Once, 5 of 7 cycles Administration: 850 mg (09/05/2018), 800 mg (10/30/2018), 800 mg (11/20/2018), 800 mg (10/09/2018), 800 mg (12/12/2018)  for chemotherapy treatment.      INTERVAL HISTORY:  Theresa Vincent 68 y.o. female returns for routine follow-up for poorly differentiated adenocarcinoma of the right lung with extensive brain metastasis. She is eating smaller meal more frequently. She is still having nausea and vomiting but it is slightly improved. She is using the patch and the steroids and it has helped. She has no balance when standing. She also has dry skin and reports it can be itchy at times. Denies any diarrhea. Denies any new pains. Had not noticed any recent bleeding such as epistaxis, hematuria or hematochezia. Denies recent chest pain on exertion, shortness of breath on minimal exertion, pre-syncopal episodes,  or palpitations. Denies any numbness or tingling in hands or feet. Denies any recent fevers, infections, or recent hospitalizations. She reports her appetite at 50% and energy level at 25%.    REVIEW OF SYSTEMS:  Review of Systems  Constitutional: Positive for fatigue.  Gastrointestinal: Positive for diarrhea, nausea and vomiting.  Musculoskeletal: Positive for gait problem.  Neurological: Positive for dizziness, extremity weakness and gait problem.  All other systems reviewed and are negative.    PAST MEDICAL/SURGICAL HISTORY:  Past Medical History:  Diagnosis Date  . Arthritis    in back   . Cancer ([facility])   . Diverticula of colon   . Diverticulosis   . Hemorrhoid   . Thyroid disease    was hyperthyroid had frozen and now on meds   Past Surgical History:  Procedure Laterality Date  . Broken toe screw in toe    . EYE SURGERY  03/20/16  . IR IMAGING GUIDED PORT INSERTION  09/11/2018  . pt had her bladder stretched in the past     Dr. Michela Pitcher   . radiation thyroid    . WRIST SURGERY Right      SOCIAL HISTORY:  Social History   Socioeconomic History  . Marital status: Legally Separated    Spouse name: Not on file  . Number of children: Not on file  . Years of education: hs   . Highest education level: Not on file  Occupational History    Employer: [employer]eds  . Financial resource strain: Not on file  .  Food insecurity:    Worry: Not on file    Inability: Not on file  . Transportation needs:    Medical: Not on file    Non-medical: Not on file  Tobacco Use  . Smoking status: Former Smoker    Packs/day: 0.00    Years: 45.00    Pack years: 0.00  . Smokeless tobacco: Never Used  Substance and Sexual Activity  . Alcohol use: Yes    Comment: rarely   . Drug use: No  . Sexual activity: Not Currently    Birth control/protection: Post-menopausal  Lifestyle  . Physical activity:    Days per week: Not on file    Minutes per session: Not on file   . Stress: Not on file  Relationships  . Social connections:    Talks on phone: Not on file    Gets together: Not on file    Attends religious service: Not on file    Active member of club or organization: Not on file    Attends meetings of clubs or organizations: Not on file    Relationship status: Not on file  . Intimate partner violence:    Fear of current or ex partner: Not on file    Emotionally abused: Not on file    Physically abused: Not on file    Forced sexual activity: Not on file  Other Topics Concern  . Not on file  Social History Narrative  . Not on file    FAMILY HISTORY:  Family History  Problem Relation Age of Onset  . Diabetes Mother   . Heart disease Father   . Diabetes Father   . Diabetes Sister   . Diabetes Sister   . Cancer Sister        breast    CURRENT MEDICATIONS:  Outpatient Encounter Medications as of 01/02/2019  Medication Sig  . acetaminophen (TYLENOL) 500 MG tablet Take 500 mg by mouth daily as needed for moderate pain or headache.  . dexamethasone (DECADRON) 4 MG tablet Take 1 tab two times a day the day before Alimta chemo. Take 2 tabs the day after chemo, then take 2 tabs two times a day for 2 days.  Marland Kitchen dronabinol (MARINOL) 2.5 MG capsule Take 1 capsule (2.5 mg total) by mouth 2 (two) times daily before lunch and supper.  . escitalopram (LEXAPRO) 10 MG tablet   . folic acid (FOLVITE) 1 MG tablet Take 1 mg by mouth daily.  . hydrocortisone valerate cream (WESTCORT) 0.2 % Apply topically 2 (two) times daily.  Marland Kitchen lidocaine-prilocaine (EMLA) cream Apply to affected area once (Patient taking differently: Apply 1 application topically daily as needed (local anesthesia). Apply to affected area once)  . Misc. Devices MISC Please supply one wheelchair for patient.  . Multiple Vitamin (MULTIVITAMIN WITH MINERALS) TABS tablet Take 1 tablet by mouth daily.  . ondansetron (ZOFRAN) 8 MG tablet Take 1 tablet (8 mg total) by mouth 2 (two) times daily as  needed (Nausea or vomiting).  . PEMEtrexed Disodium (ALIMTA IV) Inject into the vein every 21 ( twenty-one) days.  . prochlorperazine (COMPAZINE) 10 MG tablet Take 1 tablet (10 mg total) by mouth every 6 (six) hours as needed (Nausea or vomiting).  Marland Kitchen scopolamine (TRANSDERM-SCOP, 1.5 MG,) 1 MG/3DAYS Place 1 patch (1.5 mg total) onto the skin every 3 (three) days.  Marland Kitchen SPIRIVA HANDIHALER 18 MCG inhalation capsule   . VENTOLIN HFA 108 (90 Base) MCG/ACT inhaler   . [DISCONTINUED] levothyroxine (SYNTHROID, LEVOTHROID) 25  MCG tablet   . [DISCONTINUED] feeding supplement, ENSURE ENLIVE, (ENSURE ENLIVE) LIQD Take 237 mLs by mouth 2 (two) times daily between meals.  . [DISCONTINUED] guaiFENesin-dextromethorphan (ROBITUSSIN DM) 100-10 MG/5ML syrup Take 5 mLs by mouth every 4 (four) hours as needed for cough (chest congestion).  . [DISCONTINUED] ibuprofen (ADVIL,MOTRIN) 200 MG tablet Take 400 mg by mouth every 6 (six) hours as needed (pain / headache).  . [DISCONTINUED] promethazine (PHENERGAN) 12.5 MG suppository Place 1 suppository (12.5 mg total) rectally every 6 (six) hours as needed for nausea or vomiting.  . [DISCONTINUED] promethazine (PHENERGAN) 12.5 MG tablet Take 1 tablet (12.5 mg total) by mouth every 6 (six) hours as needed for nausea or vomiting.  . [DISCONTINUED] pseudoephedrine-acetaminophen (TYLENOL SINUS) 30-500 MG TABS tablet Take 1 tablet by mouth daily as needed (allergies).   No facility-administered encounter medications on file as of 01/02/2019.     ALLERGIES:  Allergies  Allergen Reactions  . Avelox [Moxifloxacin Hcl In Nacl] Itching     PHYSICAL EXAM:  ECOG Performance status: 1  Vitals:   01/02/19 0900  BP: 116/75  Pulse: 79  Resp: 16  Temp: 97.8 F (36.6 C)  SpO2: 96%   Filed Weights   01/02/19 0900  Weight: 126 lb 6 oz (57.3 kg)    Physical Exam Constitutional:      Appearance: Normal appearance. She is normal weight.  Cardiovascular:     Rate and Rhythm:  Normal rate and regular rhythm.     Heart sounds: Normal heart sounds.  Pulmonary:     Effort: Pulmonary effort is normal.     Breath sounds: Normal breath sounds.  Musculoskeletal:     Comments: wheelchair  Skin:    General: Skin is warm and dry.  Neurological:     Mental Status: She is alert and oriented to person, place, and time. Mental status is at baseline.  Psychiatric:        Mood and Affect: Mood normal.        Behavior: Behavior normal.        Thought Content: Thought content normal.        Judgment: Judgment normal.   2.5 cm right submandibular lymph nodes stable from last visit.   LABORATORY DATA:  I have reviewed the labs as listed.  CBC    Component Value Date/Time   WBC 5.5 01/02/2019 0816   RBC 3.99 01/02/2019 0816   HGB 11.5 (L) 01/02/2019 0816   HCT 38.3 01/02/2019 0816   PLT 540 (H) 01/02/2019 0816   MCV 96.0 01/02/2019 0816   MCH 28.8 01/02/2019 0816   MCHC 30.0 01/02/2019 0816   RDW 16.8 (H) 01/02/2019 0816   LYMPHSABS 0.9 01/02/2019 0816   MONOABS 0.8 01/02/2019 0816   EOSABS 0.0 01/02/2019 0816   BASOSABS 0.0 01/02/2019 0816   CMP Latest Ref Rng & Units 01/02/2019 12/12/2018 11/20/2018  Glucose 70 - 99 mg/dL 139(H) 129(H) 124(H)  BUN 8 - 23 mg/dL <5(L) 6(L) 6(L)  Creatinine 0.44 - 1.00 mg/dL 0.52 0.57 0.57  Sodium 135 - 145 mmol/L 140 138 139  Potassium 3.5 - 5.1 mmol/L 3.3(L) 2.9(L) 3.7  Chloride 98 - 111 mmol/L 101 98 101  CO2 22 - 32 mmol/L '27 25 25  '$ Calcium 8.9 - 10.3 mg/dL 9.1 9.1 9.3  Total Protein 6.5 - 8.1 g/dL 7.0 6.6 6.9  Total Bilirubin 0.3 - 1.2 mg/dL 0.4 0.2(L) 0.5  Alkaline Phos 38 - 126 U/L 130(H) 112 105  AST 15 -  41 U/L 43(H) 33 32  ALT 0 - 44 U/L '12 12 14       '$ DIAGNOSTIC IMAGING:  I have independently reviewed the scans and discussed with the patient.   I have reviewed Francene Finders, NP's note and agree with the documentation.  I personally performed a face-to-face visit, made revisions and my assessment and plan is  as follows.    ASSESSMENT & PLAN:   Non-small cell lung cancer, right ([facility]) 1.  Stage IVa (T1BN2M1B) poorly differentiated adenocarcinoma of the right lung with extensive brain metastasis: Foundation 1 testing shows MS-stable, TMB-intermediate and no other actionable mutations.  Not enough tissue for PDL 1 testing. - Presentation with the 2 to 31-monthhistory of right submandibular mass, seen by Dr. TBenjamine Mola  Flexible fiberoptic laryngoscopy showed normal examination. - She underwent right submandibular lymph node biopsy on 06/26/2018 which showed metastatic poorly differentiated adenocarcinoma, TTF-1 positive, CK positive. - Patient is a current active smoker, quit 2 weeks ago, smoked about half pack per day for more than 25 years. -PET CT scan on 07/15/2018 shows hypermetabolic 1.8 cm right upper lobe pulmonary nodule, suspicious for primary bronchus and carcinoma, ipsilateral hypermetabolic mediastinal adenopathy consistent with metastatic disease, 2.3 cm hypermetabolic right submandibular lymph node. -Brain MRI on 07/24/2018 showed extremely widespread metastatic disease to the brain, more than 40 minutes, largest 2.5 cm. - She received whole brain radiation therapy from 08/02/2018 through 08/15/2018, 30 Gray in 10 fractions. - 4 cycles of pemetrexed from 09/05/2018 through 11/20/2018.   - She was admitted to the hospital with pneumonia, sepsis and COPD exacerbation from 09/15/2018 through 09/23/2018. - CT CAP on 11/13/2018 shows right upper lobe lung mass stable.  Mediastinal and hilar adenopathy is also stable.  No new lesions were seen.  MRI of the brain showed improvement of her lesions. - She did receive cycle 5 on 12/12/2018.  She tolerated it very well. - Right submandibular lymph node measures about 2.5 cm.  Slightly prominent from last time.  Prominence thought to be secondary to weight loss. -She gained about 2 pounds since last visit.  She may proceed with cycle 6 without any dose  modifications. -I plan to repeat CT CAP and MRI of the brain prior to next visit.  2.  Family history: -She had one sister who had TNBC at 434  Another sister had breast cancer at age 331  Maternal aunt had breast cancer at age 67  I have recommended her sister to undergo testing for BRCA 1/2 .  3.  Loss of appetite: - Mirtazapine did not help. -She is currently taking Marinol 2.5 mg twice daily.  She cannot tell the difference.  I have recommended increasing it to 5 mg twice daily.  4.  Intractable nausea/vomiting: - She is using Compazine 3 times a day.  She is also taking Zofran 4 times a day.  Still she is throwing up. - We started her on scopolamine patch and dexamethasone 4 mg in the mornings on 12/12/2018. -Overall the nausea and vomiting is better.  However she still has some vomiting once a day.      Orders placed this encounter:  Orders Placed This Encounter  Procedures  . MR Brain W Wo Contrast  . CT CHEST W CONTRAST  . CT Abdomen Pelvis W Contrast  . Magnesium  . CBC with Differential/Platelet  . Comprehensive metabolic panel  . TSH      SDerek Jack [phone]

## 2019-01-02 NOTE — Patient Instructions (Signed)
[facility] Discharge Instructions for Patients Receiving Chemotherapy   Beginning January 23rd 2017 lab work for the Cancer Center will be done in the  Main lab at [facility] on 1st floor. If you have a lab appointment with the Cancer Center please come in thru the  Main Entrance and check in at the main information desk   Today you received the following chemotherapy agents   To help prevent nausea and vomiting after your treatment, we encourage you to take your nausea medication     If you develop nausea and vomiting, or diarrhea that is not controlled by your medication, call the clinic.  The clinic phone number is [phone]. Office hours are Monday-Friday 8:30am-5:00pm.  BELOW ARE SYMPTOMS THAT SHOULD BE REPORTED IMMEDIATELY:  *FEVER GREATER THAN 101.0 F  *CHILLS WITH OR WITHOUT FEVER  NAUSEA AND VOMITING THAT IS NOT CONTROLLED WITH YOUR NAUSEA MEDICATION  *UNUSUAL SHORTNESS OF BREATH  *UNUSUAL BRUISING OR BLEEDING  TENDERNESS IN MOUTH AND THROAT WITH OR WITHOUT PRESENCE OF ULCERS  *URINARY PROBLEMS  *BOWEL PROBLEMS  UNUSUAL RASH Items with * indicate a potential emergency and should be followed up as soon as possible. If you have an emergency after office hours please contact your primary care physician or go to the nearest emergency department.  Please call the clinic during office hours if you have any questions or concerns.   You may also contact the Patient Navigator at [phone] should you have any questions or need assistance in obtaining follow up care.      Resources For Cancer Patients and their Caregivers ? American Cancer Society: Can assist with transportation, wigs, general needs, runs Look Good Feel Better.        1-888-227-6333 ? Cancer Care: Provides financial assistance, online support groups, medication/co-pay assistance.  1-800-813-HOPE (4673) ? [facility] Assists [place] cancer  patients and their families through emotional , educational and financial support.  336-427-4357 ? [place] DSS Where to apply for food stamps, Medicaid and utility assistance. 336-342-1394 ? [facility]: Transportation to medical appointments. 336-347-2287 ? Social Security Administration: May apply for disability if have a Stage IV cancer. 336-342-7796 1-800-772-1213 ? [place] Aging, Disability and Transit Services: Assists with nutrition, care and transit needs. 336-349-2343         

## 2019-01-02 NOTE — Assessment & Plan Note (Signed)
1.  Stage IVa (T1BN2M1B) poorly differentiated adenocarcinoma of the right lung with extensive brain metastasis: Foundation 1 testing shows MS-stable, TMB-intermediate and no other actionable mutations.  Not enough tissue for PDL 1 testing. - Presentation with the 2 to 21-monthhistory of right submandibular mass, seen by Dr. TBenjamine Mola  Flexible fiberoptic laryngoscopy showed normal examination. - She underwent right submandibular lymph node biopsy on 06/26/2018 which showed metastatic poorly differentiated adenocarcinoma, TTF-1 positive, CK positive. - Patient is a current active smoker, quit 2 weeks ago, smoked about half pack per day for more than 25 years. -PET CT scan on 07/15/2018 shows hypermetabolic 1.8 cm right upper lobe pulmonary nodule, suspicious for primary bronchus and carcinoma, ipsilateral hypermetabolic mediastinal adenopathy consistent with metastatic disease, 2.3 cm hypermetabolic right submandibular lymph node. -Brain MRI on 07/24/2018 showed extremely widespread metastatic disease to the brain, more than 40 minutes, largest 2.5 cm. - She received whole brain radiation therapy from 08/02/2018 through 08/15/2018, 30 Gray in 10 fractions. - 4 cycles of pemetrexed from 09/05/2018 through 11/20/2018.   - She was admitted to the hospital with pneumonia, sepsis and COPD exacerbation from 09/15/2018 through 09/23/2018. - CT CAP on 11/13/2018 shows right upper lobe lung mass stable.  Mediastinal and hilar adenopathy is also stable.  No new lesions were seen.  MRI of the brain showed improvement of her lesions. - She did receive cycle 5 on 12/12/2018.  She tolerated it very well. - Right submandibular lymph node measures about 2.5 cm.  Slightly prominent from last time.  Prominence thought to be secondary to weight loss. -She gained about 2 pounds since last visit.  She may proceed with cycle 6 without any dose modifications. -I plan to repeat CT CAP and MRI of the brain prior to next visit.  2.  Family  history: -She had one sister who had TNBC at 463  Another sister had breast cancer at age 67  Maternal aunt had breast cancer at age 67  I have recommended her sister to undergo testing for BRCA 1/2 .  3.  Loss of appetite: - Mirtazapine did not help. -She is currently taking Marinol 2.5 mg twice daily.  She cannot tell the difference.  I have recommended increasing it to 5 mg twice daily.  4.  Intractable nausea/vomiting: - She is using Compazine 3 times a day.  She is also taking Zofran 4 times a day.  Still she is throwing up. - We started her on scopolamine patch and dexamethasone 4 mg in the mornings on 12/12/2018. -Overall the nausea and vomiting is better.  However she still has some vomiting once a day.

## 2019-01-02 NOTE — Patient Instructions (Signed)
[facility] at [facility] Discharge Instructions     Thank you for choosing [facility] at [facility] to provide your oncology and hematology care.  To afford each patient quality time with our provider, please arrive at least 15 minutes before your scheduled appointment time.   If you have a lab appointment with the Cancer Center please come in thru the  Main Entrance and check in at the main information desk  You need to re-schedule your appointment should you arrive 10 or more minutes late.  We strive to give you quality time with our providers, and arriving late affects you and other patients whose appointments are after yours.  Also, if you no show three or more times for appointments you may be dismissed from the clinic at the providers discretion.     Again, thank you for choosing [facility].  Our hope is that these requests will decrease the amount of time that you wait before being seen by our physicians.       _____________________________________________________________  Should you have questions after your visit to [facility], please contact our office at [phone] between the hours of 8:00 a.m. and 4:30 p.m.  Voicemails left after 4:00 p.m. will not be returned until the following business day.  For prescription refill requests, have your pharmacy contact our office and allow 72 hours.    Cancer Center Support Programs:   > Cancer Support Group  2nd Tuesday of the month 1pm-2pm, Journey Room    

## 2019-01-02 NOTE — Progress Notes (Signed)
Labs reviewed at doctor visit, proceed with treatment today. Will give potassium per orders.   Treatment given per orders. Patient tolerated it well without problems. Vitals stable and discharged home from clinic via wheelchair. Follow up as scheduled.

## 2019-01-03 ENCOUNTER — Encounter (HOSPITAL_COMMUNITY): Payer: Self-pay | Admitting: *Deleted

## 2019-01-03 MED ORDER — LEVOTHYROXINE SODIUM 25 MCG PO TABS: 25.0000 ug | ORAL_TABLET | Freq: Every day | ORAL | 0 refills | Status: AC

## 2019-01-03 NOTE — Progress Notes (Signed)
I called at the request of Francene Finders NP and left voicemail on patient's daughter's phone to advise that lab work showed elevated TSH levels, which is expected after radiation. Synthroid has been called in to pharmacy and she needs to start taking it in the mornings.  I left my number and asked that she return my call if she had any questions or concerns.

## 2019-01-03 NOTE — Addendum Note (Signed)
Addended by: Glennie Isle on: 01/03/2019 09:46 AM   Modules accepted: Orders

## 2019-01-10 DIAGNOSIS — R112 Nausea with vomiting, unspecified: Secondary | ICD-10-CM | POA: Diagnosis not present

## 2019-01-10 DIAGNOSIS — J9621 Acute and chronic respiratory failure with hypoxia: Secondary | ICD-10-CM | POA: Diagnosis not present

## 2019-01-10 DIAGNOSIS — C3491 Malignant neoplasm of unspecified part of right bronchus or lung: Secondary | ICD-10-CM | POA: Diagnosis not present

## 2019-01-10 DIAGNOSIS — J449 Chronic obstructive pulmonary disease, unspecified: Secondary | ICD-10-CM | POA: Diagnosis not present

## 2019-01-10 DIAGNOSIS — C7931 Secondary malignant neoplasm of brain: Secondary | ICD-10-CM | POA: Diagnosis not present

## 2019-01-10 DIAGNOSIS — Z8701 Personal history of pneumonia (recurrent): Secondary | ICD-10-CM | POA: Diagnosis not present

## 2019-01-14 ENCOUNTER — Other Ambulatory Visit (HOSPITAL_COMMUNITY): Payer: Self-pay | Admitting: Nurse Practitioner

## 2019-01-14 DIAGNOSIS — C3491 Malignant neoplasm of unspecified part of right bronchus or lung: Secondary | ICD-10-CM | POA: Diagnosis not present

## 2019-01-14 DIAGNOSIS — J9621 Acute and chronic respiratory failure with hypoxia: Secondary | ICD-10-CM | POA: Diagnosis not present

## 2019-01-14 DIAGNOSIS — R630 Anorexia: Secondary | ICD-10-CM

## 2019-01-14 DIAGNOSIS — R112 Nausea with vomiting, unspecified: Secondary | ICD-10-CM | POA: Diagnosis not present

## 2019-01-14 DIAGNOSIS — J449 Chronic obstructive pulmonary disease, unspecified: Secondary | ICD-10-CM | POA: Diagnosis not present

## 2019-01-14 DIAGNOSIS — C7931 Secondary malignant neoplasm of brain: Secondary | ICD-10-CM | POA: Diagnosis not present

## 2019-01-14 DIAGNOSIS — Z8701 Personal history of pneumonia (recurrent): Secondary | ICD-10-CM | POA: Diagnosis not present

## 2019-01-16 ENCOUNTER — Other Ambulatory Visit (HOSPITAL_COMMUNITY): Payer: Self-pay | Admitting: *Deleted

## 2019-01-16 ENCOUNTER — Other Ambulatory Visit (HOSPITAL_COMMUNITY): Payer: Self-pay | Admitting: Emergency Medicine

## 2019-01-16 ENCOUNTER — Telehealth (HOSPITAL_COMMUNITY): Payer: Self-pay | Admitting: Emergency Medicine

## 2019-01-16 NOTE — Telephone Encounter (Signed)
Patients daughter called requesting refill on pain medication which was originally prescribed in September by Dr.Higgs. Request forwarded to [facility], NP. Patient is c/o increased leg weakness making her unable to walk or stand w/out significant assistance. She is also having more frequent headaches. Note sent to [facility], NP regarding this as well.

## 2019-01-17 ENCOUNTER — Other Ambulatory Visit (HOSPITAL_COMMUNITY): Payer: Self-pay | Admitting: Nurse Practitioner

## 2019-01-17 DIAGNOSIS — C3491 Malignant neoplasm of unspecified part of right bronchus or lung: Secondary | ICD-10-CM

## 2019-01-17 MED ORDER — HYDROCODONE-ACETAMINOPHEN 5-325 MG PO TABS: 1.0000 | ORAL_TABLET | Freq: Four times a day (QID) | ORAL | 0 refills | Status: AC | PRN

## 2019-01-18 DIAGNOSIS — J449 Chronic obstructive pulmonary disease, unspecified: Secondary | ICD-10-CM | POA: Diagnosis not present

## 2019-01-18 DIAGNOSIS — Z8701 Personal history of pneumonia (recurrent): Secondary | ICD-10-CM | POA: Diagnosis not present

## 2019-01-18 DIAGNOSIS — C7931 Secondary malignant neoplasm of brain: Secondary | ICD-10-CM | POA: Diagnosis not present

## 2019-01-18 DIAGNOSIS — R112 Nausea with vomiting, unspecified: Secondary | ICD-10-CM | POA: Diagnosis not present

## 2019-01-18 DIAGNOSIS — C3491 Malignant neoplasm of unspecified part of right bronchus or lung: Secondary | ICD-10-CM | POA: Diagnosis not present

## 2019-01-18 DIAGNOSIS — J9621 Acute and chronic respiratory failure with hypoxia: Secondary | ICD-10-CM | POA: Diagnosis not present

## 2019-01-19 DIAGNOSIS — C7931 Secondary malignant neoplasm of brain: Secondary | ICD-10-CM | POA: Diagnosis not present

## 2019-01-19 DIAGNOSIS — R112 Nausea with vomiting, unspecified: Secondary | ICD-10-CM | POA: Diagnosis not present

## 2019-01-19 DIAGNOSIS — Z8701 Personal history of pneumonia (recurrent): Secondary | ICD-10-CM | POA: Diagnosis not present

## 2019-01-19 DIAGNOSIS — C3491 Malignant neoplasm of unspecified part of right bronchus or lung: Secondary | ICD-10-CM | POA: Diagnosis not present

## 2019-01-19 DIAGNOSIS — J449 Chronic obstructive pulmonary disease, unspecified: Secondary | ICD-10-CM | POA: Diagnosis not present

## 2019-01-19 DIAGNOSIS — J9621 Acute and chronic respiratory failure with hypoxia: Secondary | ICD-10-CM | POA: Diagnosis not present

## 2019-01-20 ENCOUNTER — Ambulatory Visit (HOSPITAL_COMMUNITY)
Admission: RE | Admit: 2019-01-20 | Discharge: 2019-01-20 | Disposition: A | Discharge status: Home / Self Care | Payer: Medicare Other | Source: Ambulatory Visit | Attending: Nurse Practitioner | Admitting: Nurse Practitioner

## 2019-01-20 ENCOUNTER — Inpatient Hospital Stay (HOSPITAL_COMMUNITY): Payer: Medicare Other

## 2019-01-20 DIAGNOSIS — C3491 Malignant neoplasm of unspecified part of right bronchus or lung: Secondary | ICD-10-CM | POA: Diagnosis not present

## 2019-01-20 DIAGNOSIS — C3490 Malignant neoplasm of unspecified part of unspecified bronchus or lung: Secondary | ICD-10-CM | POA: Diagnosis not present

## 2019-01-20 DIAGNOSIS — C7951 Secondary malignant neoplasm of bone: Secondary | ICD-10-CM | POA: Diagnosis not present

## 2019-01-20 DIAGNOSIS — R42 Dizziness and giddiness: Secondary | ICD-10-CM | POA: Diagnosis not present

## 2019-01-20 DIAGNOSIS — Z5111 Encounter for antineoplastic chemotherapy: Secondary | ICD-10-CM | POA: Diagnosis not present

## 2019-01-20 DIAGNOSIS — Z79899 Other long term (current) drug therapy: Secondary | ICD-10-CM | POA: Diagnosis not present

## 2019-01-20 DIAGNOSIS — R112 Nausea with vomiting, unspecified: Secondary | ICD-10-CM | POA: Diagnosis not present

## 2019-01-20 DIAGNOSIS — C7931 Secondary malignant neoplasm of brain: Secondary | ICD-10-CM | POA: Diagnosis not present

## 2019-01-20 DIAGNOSIS — C3411 Malignant neoplasm of upper lobe, right bronchus or lung: Secondary | ICD-10-CM | POA: Diagnosis not present

## 2019-01-20 LAB — CBC WITH DIFFERENTIAL/PLATELET
Abs Immature Granulocytes: 0.1 10*3/uL — ABNORMAL HIGH (ref 0.00–0.07)
Basophils Absolute: 0 10*3/uL (ref 0.0–0.1)
Basophils Relative: 1 %
EOS PCT: 2 %
Eosinophils Absolute: 0.1 10*3/uL (ref 0.0–0.5)
HEMATOCRIT: 40.6 % (ref 36.0–46.0)
HEMOGLOBIN: 12.2 g/dL (ref 12.0–15.0)
Immature Granulocytes: 2 %
LYMPHS PCT: 19 %
Lymphs Abs: 1 10*3/uL (ref 0.7–4.0)
MCH: 28.5 pg (ref 26.0–34.0)
MCHC: 30 g/dL (ref 30.0–36.0)
MCV: 94.9 fL (ref 80.0–100.0)
Monocytes Absolute: 0.7 10*3/uL (ref 0.1–1.0)
Monocytes Relative: 13 %
Neutro Abs: 3.3 10*3/uL (ref 1.7–7.7)
Neutrophils Relative %: 63 %
Platelets: 454 10*3/uL — ABNORMAL HIGH (ref 150–400)
RBC: 4.28 MIL/uL (ref 3.87–5.11)
RDW: 17.6 % — ABNORMAL HIGH (ref 11.5–15.5)
WBC: 5.2 10*3/uL (ref 4.0–10.5)
nRBC: 0 % (ref 0.0–0.2)

## 2019-01-20 LAB — MAGNESIUM: Magnesium: 1.9 mg/dL (ref 1.7–2.4)

## 2019-01-20 LAB — COMPREHENSIVE METABOLIC PANEL
ALT: 14 U/L (ref 0–44)
ANION GAP: 11 (ref 5–15)
AST: 31 U/L (ref 15–41)
Albumin: 3 g/dL — ABNORMAL LOW (ref 3.5–5.0)
Alkaline Phosphatase: 131 U/L — ABNORMAL HIGH (ref 38–126)
BUN: 5 mg/dL — ABNORMAL LOW (ref 8–23)
CO2: 27 mmol/L (ref 22–32)
Calcium: 8.7 mg/dL — ABNORMAL LOW (ref 8.9–10.3)
Chloride: 105 mmol/L (ref 98–111)
Creatinine, Ser: 0.51 mg/dL (ref 0.44–1.00)
Glucose, Bld: 128 mg/dL — ABNORMAL HIGH (ref 70–99)
Potassium: 3.4 mmol/L — ABNORMAL LOW (ref 3.5–5.1)
Sodium: 143 mmol/L (ref 135–145)
Total Bilirubin: 0.4 mg/dL (ref 0.3–1.2)
Total Protein: 6.4 g/dL — ABNORMAL LOW (ref 6.5–8.1)

## 2019-01-20 LAB — TSH: TSH: 12.281 u[IU]/mL — AB (ref 0.350–4.500)

## 2019-01-20 MED ORDER — GADOBUTROL 1 MMOL/ML IV SOLN
7.0000 mL | Freq: Once | INTRAVENOUS | Status: AC | PRN
  Administered 2019-01-20: 5 mL via INTRAVENOUS

## 2019-01-20 MED ORDER — IOPAMIDOL (ISOVUE-300) INJECTION 61%
100.0000 mL | Freq: Once | INTRAVENOUS | Status: AC | PRN
  Administered 2019-01-20: 100 mL via INTRAVENOUS

## 2019-01-21 DIAGNOSIS — J449 Chronic obstructive pulmonary disease, unspecified: Secondary | ICD-10-CM | POA: Diagnosis not present

## 2019-01-21 DIAGNOSIS — Z8701 Personal history of pneumonia (recurrent): Secondary | ICD-10-CM | POA: Diagnosis not present

## 2019-01-21 DIAGNOSIS — C3491 Malignant neoplasm of unspecified part of right bronchus or lung: Secondary | ICD-10-CM | POA: Diagnosis not present

## 2019-01-21 DIAGNOSIS — J9621 Acute and chronic respiratory failure with hypoxia: Secondary | ICD-10-CM | POA: Diagnosis not present

## 2019-01-21 DIAGNOSIS — C7931 Secondary malignant neoplasm of brain: Secondary | ICD-10-CM | POA: Diagnosis not present

## 2019-01-21 DIAGNOSIS — R112 Nausea with vomiting, unspecified: Secondary | ICD-10-CM | POA: Diagnosis not present

## 2019-01-23 ENCOUNTER — Inpatient Hospital Stay (HOSPITAL_COMMUNITY): Payer: Medicare Other

## 2019-01-23 ENCOUNTER — Inpatient Hospital Stay (HOSPITAL_BASED_OUTPATIENT_CLINIC_OR_DEPARTMENT_OTHER): Payer: Medicare Other | Admitting: Hematology

## 2019-01-23 ENCOUNTER — Encounter (HOSPITAL_COMMUNITY): Payer: Self-pay | Admitting: Hematology

## 2019-01-23 DIAGNOSIS — C7951 Secondary malignant neoplasm of bone: Secondary | ICD-10-CM

## 2019-01-23 DIAGNOSIS — R531 Weakness: Secondary | ICD-10-CM | POA: Diagnosis not present

## 2019-01-23 DIAGNOSIS — R112 Nausea with vomiting, unspecified: Secondary | ICD-10-CM

## 2019-01-23 DIAGNOSIS — C3411 Malignant neoplasm of upper lobe, right bronchus or lung: Secondary | ICD-10-CM

## 2019-01-23 DIAGNOSIS — R630 Anorexia: Secondary | ICD-10-CM | POA: Diagnosis not present

## 2019-01-23 DIAGNOSIS — R5383 Other fatigue: Secondary | ICD-10-CM | POA: Diagnosis not present

## 2019-01-23 DIAGNOSIS — Z79899 Other long term (current) drug therapy: Secondary | ICD-10-CM | POA: Diagnosis not present

## 2019-01-23 DIAGNOSIS — C778 Secondary and unspecified malignant neoplasm of lymph nodes of multiple regions: Secondary | ICD-10-CM

## 2019-01-23 DIAGNOSIS — Z5111 Encounter for antineoplastic chemotherapy: Secondary | ICD-10-CM | POA: Diagnosis not present

## 2019-01-23 DIAGNOSIS — C3491 Malignant neoplasm of unspecified part of right bronchus or lung: Secondary | ICD-10-CM

## 2019-01-23 DIAGNOSIS — Z87891 Personal history of nicotine dependence: Secondary | ICD-10-CM

## 2019-01-23 DIAGNOSIS — C7931 Secondary malignant neoplasm of brain: Secondary | ICD-10-CM | POA: Diagnosis not present

## 2019-01-23 NOTE — Progress Notes (Signed)
No treatment today per MD.  

## 2019-01-23 NOTE — Assessment & Plan Note (Signed)
1.  Stage IVa (T1BN2M1B) poorly differentiated adenocarcinoma of the right lung with extensive brain metastasis: Foundation 1 testing shows MS-stable, TMB-intermediate and no other actionable mutations.  Not enough tissue for PDL 1 testing. - Presentation with the 2 to 50-monthhistory of right submandibular mass, seen by Dr. TBenjamine Mola  Flexible fiberoptic laryngoscopy showed normal examination. - She underwent right submandibular lymph node biopsy on 06/26/2018 which showed metastatic poorly differentiated adenocarcinoma, TTF-1 positive, CK positive. - Patient is a current active smoker, quit 2 weeks ago, smoked about half pack per day for more than 25 years. -PET CT scan on 07/15/2018 shows hypermetabolic 1.8 cm right upper lobe pulmonary nodule, suspicious for primary bronchus and carcinoma, ipsilateral hypermetabolic mediastinal adenopathy consistent with metastatic disease, 2.3 cm hypermetabolic right submandibular lymph node. -Brain MRI on 07/24/2018 showed extremely widespread metastatic disease to the brain, more than 40 minutes, largest 2.5 cm. - She received whole brain radiation therapy from 08/02/2018 through 08/15/2018, 30 Gray in 10 fractions. - 4 cycles of pemetrexed from 09/05/2018 through 11/20/2018.   - She was admitted to the hospital with pneumonia, sepsis and COPD exacerbation from 09/15/2018 through 09/23/2018. - CT CAP on 11/13/2018 shows right upper lobe lung mass stable.  Mediastinal and hilar adenopathy is also stable.  No new lesions were seen.  MRI of the brain showed improvement of her lesions. - She received cycle 6 on 01/02/2019.  She had very minimal weight loss. -She has been having nausea and vomiting with chemotherapy. -According to the daughter and sister, she is weak in the legs and unable to stand up for walk by her own.  She lost about 2 to 3 pounds since last visit. - We discussed results of the CT CAP and MRI of the brain from 01/20/2018.  At least 9 lesions in the brain have  increased in size.  The rest of them have decreased in size.  CT scan showed necrotic right submandibular adenopathy.  Vague areas of sclerosis in the sternum and right iliac wing which are new.  Necrotic mediastinal adenopathy and pulmonary nodules are stable. -Due to the declining performance status and progression in the brain and to the bones, I have recommended best supportive care in the form of hospice.  We had a prolonged discussion with the patient and her family about all the options. -She is agreeable to hospice at this time.  We will make a referral to [facility].  2.  Family history: -She had one sister who had TNBC at 437  Another sister had breast cancer at age 67  Maternal aunt had breast cancer at age 67  I have recommended her sister to undergo testing for BRCA 1/2 .  3.  Loss of appetite: - We will continue Marinol 5 mg twice daily.  4.  Intractable nausea/vomiting: - She will use Compazine and Zofran. -She will also use scopolamine patch.  She is taking dexamethasone 4 mg in the mornings.

## 2019-01-23 NOTE — Progress Notes (Signed)
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  BRIEF ONCOLOGIC HISTORY:    Non-small cell lung cancer, right ([facility])   07/16/2018 Initial Diagnosis    Non-small cell lung cancer, right ([facility])    09/05/2018 -  Chemotherapy    The patient had PEMEtrexed (ALIMTA) 850 mg in sodium chloride 0.9 % 100 mL chemo infusion, 500 mg/m2 = 850 mg, Intravenous,  Once, 6 of 7 cycles Administration: 850 mg (09/05/2018), 800 mg (10/30/2018), 800 mg (11/20/2018), 800 mg (01/02/2019), 800 mg (10/09/2018), 800 mg (12/12/2018)  for chemotherapy treatment.      Brain metastases ([facility])   08/01/2018 Initial Diagnosis    Brain metastases ([facility])    09/05/2018 -  Chemotherapy    The patient had PEMEtrexed (ALIMTA) 850 mg in sodium chloride 0.9 % 100 mL chemo infusion, 500 mg/m2 = 850 mg, Intravenous,  Once, 6 of 7 cycles Administration: 850 mg (09/05/2018), 800 mg (10/30/2018), 800 mg (11/20/2018), 800 mg (01/02/2019), 800 mg (10/09/2018), 800 mg (12/12/2018)  for chemotherapy treatment.       INTERVAL HISTORY:  Theresa Vincent 68 y.o. female returns for routine follow-up for poorly differentiated adenocarcinoma of the right lung with extensive brain metastasis. She is unable to stand on her own anymore. She has severe weakness and fatigue. She has memory problems. She isn't eating due to the constant nausea. She has generalized swelling. Denies any nausea, vomiting, or diarrhea. Denies any new pains. Had not noticed any recent bleeding such as epistaxis, hematuria or hematochezia. Denies recent chest pain on exertion, shortness of breath on minimal exertion, pre-syncopal episodes, or  palpitations. Denies any numbness or tingling in hands or feet. Denies any recent fevers, infections, or recent hospitalizations. Patient reports appetite at 25% and energy level at 25%.    REVIEW OF SYSTEMS:  Review of Systems  Constitutional: Positive for appetite change and fatigue.  Respiratory: Positive for shortness of breath.   Neurological: Positive for dizziness, extremity weakness and headaches.  All other systems reviewed and are negative.    PAST MEDICAL/SURGICAL HISTORY:  Past Medical History:  Diagnosis Date  . Arthritis    in back   . Cancer ([facility])   . Diverticula of colon   . Diverticulosis   . Hemorrhoid   . Thyroid disease    was hyperthyroid had frozen and now on meds   Past Surgical History:  Procedure Laterality Date  . Broken toe screw in toe    . EYE SURGERY  03/20/16  . IR IMAGING GUIDED PORT INSERTION  09/11/2018  . pt had her bladder stretched in the past     Dr. Michela Pitcher   . radiation thyroid    . WRIST SURGERY Right      SOCIAL HISTORY:  Social History   Socioeconomic History  . Marital status: Legally Separated    Spouse name: Not on file  . Number of children: Not on file  . Years of education: hs   . Highest education level: Not on file  Occupational History    Employer: [employer]  . Financial resource strain: Not on file  . Food insecurity:    Worry: Not on file  Inability: Not on file  . Transportation needs:    Medical: Not on file    Non-medical: Not on file  Tobacco Use  . Smoking status: Former Smoker    Packs/day: 0.00    Years: 45.00    Pack years: 0.00  . Smokeless tobacco: Never Used  Substance and Sexual Activity  . Alcohol use: Yes    Comment: rarely   . Drug use: No  . Sexual activity: Not Currently    Birth control/protection: Post-menopausal  Lifestyle  . Physical activity:    Days per week: Not on file    Minutes per session: Not on file  . Stress: Not on file  Relationships  .  Social connections:    Talks on phone: Not on file    Gets together: Not on file    Attends religious service: Not on file    Active member of club or organization: Not on file    Attends meetings of clubs or organizations: Not on file    Relationship status: Not on file  . Intimate partner violence:    Fear of current or ex partner: Not on file    Emotionally abused: Not on file    Physically abused: Not on file    Forced sexual activity: Not on file  Other Topics Concern  . Not on file  Social History Narrative  . Not on file    FAMILY HISTORY:  Family History  Problem Relation Age of Onset  . Diabetes Mother   . Heart disease Father   . Diabetes Father   . Diabetes Sister   . Diabetes Sister   . Cancer Sister        breast    CURRENT MEDICATIONS:  Outpatient Encounter Medications as of 01/23/2019  Medication Sig  . acetaminophen (TYLENOL) 500 MG tablet Take 500 mg by mouth daily as needed for moderate pain or headache.  . dexamethasone (DECADRON) 4 MG tablet Take 1 tab two times a day the day before Alimta chemo. Take 2 tabs the day after chemo, then take 2 tabs two times a day for 2 days.  Marland Kitchen dronabinol (MARINOL) 2.5 MG capsule TAKE 1 CAPSULE (2.5 MG TOTAL) BY MOUTH 2 (TWO) TIMES DAILY BEFORE LUNCH AND SUPPER.  Marland Kitchen escitalopram (LEXAPRO) 10 MG tablet   . folic acid (FOLVITE) 1 MG tablet Take 1 mg by mouth daily.  Marland Kitchen HYDROcodone-acetaminophen (NORCO/VICODIN) 5-325 MG tablet Take 1 tablet by mouth every 6 (six) hours as needed for up to 10 days.  Marland Kitchen levothyroxine (SYNTHROID, LEVOTHROID) 25 MCG tablet Take 1 tablet (25 mcg total) by mouth daily before breakfast.  . lidocaine-prilocaine (EMLA) cream Apply to affected area once (Patient taking differently: Apply 1 application topically daily as needed (local anesthesia). Apply to affected area once)  . ondansetron (ZOFRAN) 8 MG tablet Take 1 tablet (8 mg total) by mouth 2 (two) times daily as needed (Nausea or vomiting).  .  PEMEtrexed Disodium (ALIMTA IV) Inject into the vein every 21 ( twenty-one) days.  . prochlorperazine (COMPAZINE) 10 MG tablet Take 1 tablet (10 mg total) by mouth every 6 (six) hours as needed (Nausea or vomiting).  Marland Kitchen scopolamine (TRANSDERM-SCOP, 1.5 MG,) 1 MG/3DAYS Place 1 patch (1.5 mg total) onto the skin every 3 (three) days.  Marland Kitchen SPIRIVA HANDIHALER 18 MCG inhalation capsule   . VENTOLIN HFA 108 (90 Base) MCG/ACT inhaler   . [DISCONTINUED] Multiple Vitamin (MULTIVITAMIN WITH MINERALS) TABS tablet Take 1 tablet by mouth daily.  . [  DISCONTINUED] hydrocortisone valerate cream (WESTCORT) 0.2 % Apply topically 2 (two) times daily.   No facility-administered encounter medications on file as of 01/23/2019.     ALLERGIES:  Allergies  Allergen Reactions  . Avelox [Moxifloxacin Hcl In Nacl] Itching     PHYSICAL EXAM:  ECOG Performance status: 3  Vitals:   01/23/19 1011  BP: 112/75  Pulse: 97  Resp: 18  Temp: 98.3 F (36.8 C)  SpO2: 98%   Filed Weights   01/23/19 1011  Weight: 125 lb (56.7 kg)    Physical Exam Constitutional:      Appearance: She is ill-appearing.  Musculoskeletal:     Comments: Wheelchair bound  Skin:    General: Skin is warm and dry.  Neurological:     Mental Status: She is alert.     Motor: Weakness present.     Gait: Gait abnormal.  Psychiatric:        Mood and Affect: Mood normal.    Chest is bilaterally clear to auscultation. CVS: S1-S2 regular rate and rhythm. Head and neck: Right submandibular lymph node has slightly enlarged in size.  LABORATORY DATA:  I have reviewed the labs as listed.  CBC    Component Value Date/Time   WBC 5.2 01/20/2019 0826   RBC 4.28 01/20/2019 0826   HGB 12.2 01/20/2019 0826   HCT 40.6 01/20/2019 0826   PLT 454 (H) 01/20/2019 0826   MCV 94.9 01/20/2019 0826   MCH 28.5 01/20/2019 0826   MCHC 30.0 01/20/2019 0826   RDW 17.6 (H) 01/20/2019 0826   LYMPHSABS 1.0 01/20/2019 0826   MONOABS 0.7 01/20/2019 0826    EOSABS 0.1 01/20/2019 0826   BASOSABS 0.0 01/20/2019 0826   CMP Latest Ref Rng & Units 01/20/2019 01/02/2019 12/12/2018  Glucose 70 - 99 mg/dL 128(H) 139(H) 129(H)  BUN 8 - 23 mg/dL 5(L) <5(L) 6(L)  Creatinine 0.44 - 1.00 mg/dL 0.51 0.52 0.57  Sodium 135 - 145 mmol/L 143 140 138  Potassium 3.5 - 5.1 mmol/L 3.4(L) 3.3(L) 2.9(L)  Chloride 98 - 111 mmol/L 105 101 98  CO2 22 - 32 mmol/L '27 27 25  '$ Calcium 8.9 - 10.3 mg/dL 8.7(L) 9.1 9.1  Total Protein 6.5 - 8.1 g/dL 6.4(L) 7.0 6.6  Total Bilirubin 0.3 - 1.2 mg/dL 0.4 0.4 0.2(L)  Alkaline Phos 38 - 126 U/L 131(H) 130(H) 112  AST 15 - 41 U/L 31 43(H) 33  ALT 0 - 44 U/L '14 12 12       '$ DIAGNOSTIC IMAGING:  I have independently reviewed the scans and discussed with the patient.   I have reviewed Francene Finders, NP's note and agree with the documentation.  I personally performed a face-to-face visit, made revisions and my assessment and plan is as follows.    ASSESSMENT & PLAN:   Non-small cell lung cancer, right ([facility]) 1.  Stage IVa (T1BN2M1B) poorly differentiated adenocarcinoma of the right lung with extensive brain metastasis: Foundation 1 testing shows MS-stable, TMB-intermediate and no other actionable mutations.  Not enough tissue for PDL 1 testing. - Presentation with the 2 to 68-monthhistory of right submandibular mass, seen by Dr. TBenjamine Mola  Flexible fiberoptic laryngoscopy showed normal examination. - She underwent right submandibular lymph node biopsy on 06/26/2018 which showed metastatic poorly differentiated adenocarcinoma, TTF-1 positive, CK positive. - Patient is a current active smoker, quit 2 weeks ago, smoked about half pack per day for more than 25 years. -PET CT scan on 07/15/2018 shows hypermetabolic 1.8 cm right upper lobe  pulmonary nodule, suspicious for primary bronchus and carcinoma, ipsilateral hypermetabolic mediastinal adenopathy consistent with metastatic disease, 2.3 cm hypermetabolic right submandibular lymph  node. -Brain MRI on 07/24/2018 showed extremely widespread metastatic disease to the brain, more than 40 minutes, largest 2.5 cm. - She received whole brain radiation therapy from 08/02/2018 through 08/15/2018, 30 Gray in 10 fractions. - 4 cycles of pemetrexed from 09/05/2018 through 11/20/2018.   - She was admitted to the hospital with pneumonia, sepsis and COPD exacerbation from 09/15/2018 through 09/23/2018. - CT CAP on 11/13/2018 shows right upper lobe lung mass stable.  Mediastinal and hilar adenopathy is also stable.  No new lesions were seen.  MRI of the brain showed improvement of her lesions. - She received cycle 6 on 01/02/2019.  She had very minimal weight loss. -She has been having nausea and vomiting with chemotherapy. -According to the daughter and sister, she is weak in the legs and unable to stand up for walk by her own.  She lost about 2 to 3 pounds since last visit. - We discussed results of the CT CAP and MRI of the brain from 01/20/2018.  At least 9 lesions in the brain have increased in size.  The rest of them have decreased in size.  CT scan showed necrotic right submandibular adenopathy.  Vague areas of sclerosis in the sternum and right iliac wing which are new.  Necrotic mediastinal adenopathy and pulmonary nodules are stable. -Due to the declining performance status and progression in the brain and to the bones, I have recommended best supportive care in the form of hospice.  We had a prolonged discussion with the patient and her family about all the options. -She is agreeable to hospice at this time.  We will make a referral to [facility].  2.  Family history: -She had one sister who had TNBC at 81.  Another sister had breast cancer at age 37.  Maternal aunt had breast cancer at age 70.  I have recommended her sister to undergo testing for BRCA 1/2 .  3.  Loss of appetite: - We will continue Marinol 5 mg twice daily.  4.  Intractable nausea/vomiting: - She will use  Compazine and Zofran. -She will also use scopolamine patch.  She is taking dexamethasone 4 mg in the mornings.       Orders placed this encounter:  No orders of the defined types were placed in this encounter.     Derek Jack, MD [facility] [phone]

## 2019-01-23 NOTE — Patient Instructions (Signed)
[facility] Discharge Instructions     Thank you for choosing [facility] at [facility] to provide your oncology and hematology care.  To afford each patient quality time with our provider, please arrive at least 15 minutes before your scheduled appointment time.   If you have a lab appointment with the Cancer Center please come in thru the  Main Entrance and check in at the main information desk  You need to re-schedule your appointment should you arrive 10 or more minutes late.  We strive to give you quality time with our providers, and arriving late affects you and other patients whose appointments are after yours.  Also, if you no show three or more times for appointments you may be dismissed from the clinic at the providers discretion.     Again, thank you for choosing [facility].  Our hope is that these requests will decrease the amount of time that you wait before being seen by our physicians.       _____________________________________________________________  Should you have questions after your visit to [facility], please contact our office at [phone] between the hours of 8:00 a.m. and 4:30 p.m.  Voicemails left after 4:00 p.m. will not be returned until the following business day.  For prescription refill requests, have your pharmacy contact our office and allow 72 hours.    Cancer Center Support Programs:   > Cancer Support Group  2nd Tuesday of the month 1pm-2pm, Journey Room    

## 2019-01-24 ENCOUNTER — Encounter (HOSPITAL_COMMUNITY): Payer: Self-pay | Admitting: Lab

## 2019-01-24 DIAGNOSIS — R0602 Shortness of breath: Secondary | ICD-10-CM | POA: Diagnosis not present

## 2019-01-24 DIAGNOSIS — J449 Chronic obstructive pulmonary disease, unspecified: Secondary | ICD-10-CM | POA: Diagnosis not present

## 2019-01-24 DIAGNOSIS — R630 Anorexia: Secondary | ICD-10-CM | POA: Diagnosis not present

## 2019-01-24 DIAGNOSIS — R112 Nausea with vomiting, unspecified: Secondary | ICD-10-CM | POA: Diagnosis not present

## 2019-01-24 DIAGNOSIS — M1990 Unspecified osteoarthritis, unspecified site: Secondary | ICD-10-CM | POA: Diagnosis not present

## 2019-01-24 DIAGNOSIS — C3490 Malignant neoplasm of unspecified part of unspecified bronchus or lung: Secondary | ICD-10-CM | POA: Diagnosis not present

## 2019-01-24 DIAGNOSIS — R531 Weakness: Secondary | ICD-10-CM | POA: Diagnosis not present

## 2019-01-24 DIAGNOSIS — E039 Hypothyroidism, unspecified: Secondary | ICD-10-CM | POA: Diagnosis not present

## 2019-01-24 NOTE — Progress Notes (Unsigned)
Referral to Hospice. Records faxed on 1/31

## 2019-01-25 DIAGNOSIS — J449 Chronic obstructive pulmonary disease, unspecified: Secondary | ICD-10-CM | POA: Diagnosis not present

## 2019-01-25 DIAGNOSIS — R682 Dry mouth, unspecified: Secondary | ICD-10-CM | POA: Diagnosis not present

## 2019-01-25 DIAGNOSIS — E039 Hypothyroidism, unspecified: Secondary | ICD-10-CM | POA: Diagnosis not present

## 2019-01-25 DIAGNOSIS — R630 Anorexia: Secondary | ICD-10-CM | POA: Diagnosis not present

## 2019-01-25 DIAGNOSIS — C3490 Malignant neoplasm of unspecified part of unspecified bronchus or lung: Secondary | ICD-10-CM | POA: Diagnosis not present

## 2019-01-25 DIAGNOSIS — F419 Anxiety disorder, unspecified: Secondary | ICD-10-CM | POA: Diagnosis not present

## 2019-01-25 DIAGNOSIS — K5900 Constipation, unspecified: Secondary | ICD-10-CM | POA: Diagnosis not present

## 2019-01-25 DIAGNOSIS — R531 Weakness: Secondary | ICD-10-CM | POA: Diagnosis not present

## 2019-01-25 DIAGNOSIS — R112 Nausea with vomiting, unspecified: Secondary | ICD-10-CM | POA: Diagnosis not present

## 2019-01-25 DIAGNOSIS — R0989 Other specified symptoms and signs involving the circulatory and respiratory systems: Secondary | ICD-10-CM | POA: Diagnosis not present

## 2019-01-25 DIAGNOSIS — M1990 Unspecified osteoarthritis, unspecified site: Secondary | ICD-10-CM | POA: Diagnosis not present

## 2019-01-25 DIAGNOSIS — R0602 Shortness of breath: Secondary | ICD-10-CM | POA: Diagnosis not present

## 2019-01-25 DIAGNOSIS — R52 Pain, unspecified: Secondary | ICD-10-CM | POA: Diagnosis not present

## 2019-01-27 DIAGNOSIS — R112 Nausea with vomiting, unspecified: Secondary | ICD-10-CM | POA: Diagnosis not present

## 2019-01-27 DIAGNOSIS — J449 Chronic obstructive pulmonary disease, unspecified: Secondary | ICD-10-CM | POA: Diagnosis not present

## 2019-01-27 DIAGNOSIS — C3490 Malignant neoplasm of unspecified part of unspecified bronchus or lung: Secondary | ICD-10-CM | POA: Diagnosis not present

## 2019-01-27 DIAGNOSIS — R531 Weakness: Secondary | ICD-10-CM | POA: Diagnosis not present

## 2019-01-27 DIAGNOSIS — M1990 Unspecified osteoarthritis, unspecified site: Secondary | ICD-10-CM | POA: Diagnosis not present

## 2019-01-27 DIAGNOSIS — E039 Hypothyroidism, unspecified: Secondary | ICD-10-CM | POA: Diagnosis not present

## 2019-01-28 DIAGNOSIS — R112 Nausea with vomiting, unspecified: Secondary | ICD-10-CM | POA: Diagnosis not present

## 2019-01-28 DIAGNOSIS — E039 Hypothyroidism, unspecified: Secondary | ICD-10-CM | POA: Diagnosis not present

## 2019-01-28 DIAGNOSIS — M1990 Unspecified osteoarthritis, unspecified site: Secondary | ICD-10-CM | POA: Diagnosis not present

## 2019-01-28 DIAGNOSIS — R531 Weakness: Secondary | ICD-10-CM | POA: Diagnosis not present

## 2019-01-28 DIAGNOSIS — J449 Chronic obstructive pulmonary disease, unspecified: Secondary | ICD-10-CM | POA: Diagnosis not present

## 2019-01-28 DIAGNOSIS — C3490 Malignant neoplasm of unspecified part of unspecified bronchus or lung: Secondary | ICD-10-CM | POA: Diagnosis not present

## 2019-01-29 DIAGNOSIS — M1990 Unspecified osteoarthritis, unspecified site: Secondary | ICD-10-CM | POA: Diagnosis not present

## 2019-01-29 DIAGNOSIS — C3490 Malignant neoplasm of unspecified part of unspecified bronchus or lung: Secondary | ICD-10-CM | POA: Diagnosis not present

## 2019-01-29 DIAGNOSIS — J449 Chronic obstructive pulmonary disease, unspecified: Secondary | ICD-10-CM | POA: Diagnosis not present

## 2019-01-29 DIAGNOSIS — R112 Nausea with vomiting, unspecified: Secondary | ICD-10-CM | POA: Diagnosis not present

## 2019-01-29 DIAGNOSIS — E039 Hypothyroidism, unspecified: Secondary | ICD-10-CM | POA: Diagnosis not present

## 2019-01-29 DIAGNOSIS — R531 Weakness: Secondary | ICD-10-CM | POA: Diagnosis not present

## 2019-02-03 DIAGNOSIS — M1990 Unspecified osteoarthritis, unspecified site: Secondary | ICD-10-CM | POA: Diagnosis not present

## 2019-02-03 DIAGNOSIS — E039 Hypothyroidism, unspecified: Secondary | ICD-10-CM | POA: Diagnosis not present

## 2019-02-03 DIAGNOSIS — C3490 Malignant neoplasm of unspecified part of unspecified bronchus or lung: Secondary | ICD-10-CM | POA: Diagnosis not present

## 2019-02-03 DIAGNOSIS — R112 Nausea with vomiting, unspecified: Secondary | ICD-10-CM | POA: Diagnosis not present

## 2019-02-03 DIAGNOSIS — J449 Chronic obstructive pulmonary disease, unspecified: Secondary | ICD-10-CM | POA: Diagnosis not present

## 2019-02-03 DIAGNOSIS — R531 Weakness: Secondary | ICD-10-CM | POA: Diagnosis not present

## 2019-02-04 DIAGNOSIS — J449 Chronic obstructive pulmonary disease, unspecified: Secondary | ICD-10-CM | POA: Diagnosis not present

## 2019-02-04 DIAGNOSIS — R112 Nausea with vomiting, unspecified: Secondary | ICD-10-CM | POA: Diagnosis not present

## 2019-02-04 DIAGNOSIS — C3490 Malignant neoplasm of unspecified part of unspecified bronchus or lung: Secondary | ICD-10-CM | POA: Diagnosis not present

## 2019-02-04 DIAGNOSIS — R531 Weakness: Secondary | ICD-10-CM | POA: Diagnosis not present

## 2019-02-04 DIAGNOSIS — M1990 Unspecified osteoarthritis, unspecified site: Secondary | ICD-10-CM | POA: Diagnosis not present

## 2019-02-04 DIAGNOSIS — E039 Hypothyroidism, unspecified: Secondary | ICD-10-CM | POA: Diagnosis not present

## 2019-02-05 DIAGNOSIS — R112 Nausea with vomiting, unspecified: Secondary | ICD-10-CM | POA: Diagnosis not present

## 2019-02-05 DIAGNOSIS — J449 Chronic obstructive pulmonary disease, unspecified: Secondary | ICD-10-CM | POA: Diagnosis not present

## 2019-02-05 DIAGNOSIS — R531 Weakness: Secondary | ICD-10-CM | POA: Diagnosis not present

## 2019-02-05 DIAGNOSIS — E039 Hypothyroidism, unspecified: Secondary | ICD-10-CM | POA: Diagnosis not present

## 2019-02-05 DIAGNOSIS — M1990 Unspecified osteoarthritis, unspecified site: Secondary | ICD-10-CM | POA: Diagnosis not present

## 2019-02-05 DIAGNOSIS — C3490 Malignant neoplasm of unspecified part of unspecified bronchus or lung: Secondary | ICD-10-CM | POA: Diagnosis not present

## 2019-02-06 DIAGNOSIS — M1990 Unspecified osteoarthritis, unspecified site: Secondary | ICD-10-CM | POA: Diagnosis not present

## 2019-02-06 DIAGNOSIS — E039 Hypothyroidism, unspecified: Secondary | ICD-10-CM | POA: Diagnosis not present

## 2019-02-06 DIAGNOSIS — J449 Chronic obstructive pulmonary disease, unspecified: Secondary | ICD-10-CM | POA: Diagnosis not present

## 2019-02-06 DIAGNOSIS — C3490 Malignant neoplasm of unspecified part of unspecified bronchus or lung: Secondary | ICD-10-CM | POA: Diagnosis not present

## 2019-02-06 DIAGNOSIS — R112 Nausea with vomiting, unspecified: Secondary | ICD-10-CM | POA: Diagnosis not present

## 2019-02-06 DIAGNOSIS — R531 Weakness: Secondary | ICD-10-CM | POA: Diagnosis not present

## 2019-02-07 DIAGNOSIS — E039 Hypothyroidism, unspecified: Secondary | ICD-10-CM | POA: Diagnosis not present

## 2019-02-07 DIAGNOSIS — R112 Nausea with vomiting, unspecified: Secondary | ICD-10-CM | POA: Diagnosis not present

## 2019-02-07 DIAGNOSIS — R531 Weakness: Secondary | ICD-10-CM | POA: Diagnosis not present

## 2019-02-07 DIAGNOSIS — J449 Chronic obstructive pulmonary disease, unspecified: Secondary | ICD-10-CM | POA: Diagnosis not present

## 2019-02-07 DIAGNOSIS — M1990 Unspecified osteoarthritis, unspecified site: Secondary | ICD-10-CM | POA: Diagnosis not present

## 2019-02-07 DIAGNOSIS — C3490 Malignant neoplasm of unspecified part of unspecified bronchus or lung: Secondary | ICD-10-CM | POA: Diagnosis not present

## 2019-02-08 DIAGNOSIS — R112 Nausea with vomiting, unspecified: Secondary | ICD-10-CM | POA: Diagnosis not present

## 2019-02-08 DIAGNOSIS — R531 Weakness: Secondary | ICD-10-CM | POA: Diagnosis not present

## 2019-02-08 DIAGNOSIS — E039 Hypothyroidism, unspecified: Secondary | ICD-10-CM | POA: Diagnosis not present

## 2019-02-08 DIAGNOSIS — J449 Chronic obstructive pulmonary disease, unspecified: Secondary | ICD-10-CM | POA: Diagnosis not present

## 2019-02-08 DIAGNOSIS — M1990 Unspecified osteoarthritis, unspecified site: Secondary | ICD-10-CM | POA: Diagnosis not present

## 2019-02-08 DIAGNOSIS — C3490 Malignant neoplasm of unspecified part of unspecified bronchus or lung: Secondary | ICD-10-CM | POA: Diagnosis not present

## 2019-02-09 DIAGNOSIS — J449 Chronic obstructive pulmonary disease, unspecified: Secondary | ICD-10-CM | POA: Diagnosis not present

## 2019-02-09 DIAGNOSIS — R531 Weakness: Secondary | ICD-10-CM | POA: Diagnosis not present

## 2019-02-09 DIAGNOSIS — R112 Nausea with vomiting, unspecified: Secondary | ICD-10-CM | POA: Diagnosis not present

## 2019-02-09 DIAGNOSIS — C3490 Malignant neoplasm of unspecified part of unspecified bronchus or lung: Secondary | ICD-10-CM | POA: Diagnosis not present

## 2019-02-09 DIAGNOSIS — E039 Hypothyroidism, unspecified: Secondary | ICD-10-CM | POA: Diagnosis not present

## 2019-02-09 DIAGNOSIS — M1990 Unspecified osteoarthritis, unspecified site: Secondary | ICD-10-CM | POA: Diagnosis not present

## 2019-02-10 DIAGNOSIS — R112 Nausea with vomiting, unspecified: Secondary | ICD-10-CM | POA: Diagnosis not present

## 2019-02-10 DIAGNOSIS — J449 Chronic obstructive pulmonary disease, unspecified: Secondary | ICD-10-CM | POA: Diagnosis not present

## 2019-02-10 DIAGNOSIS — C3490 Malignant neoplasm of unspecified part of unspecified bronchus or lung: Secondary | ICD-10-CM | POA: Diagnosis not present

## 2019-02-10 DIAGNOSIS — E039 Hypothyroidism, unspecified: Secondary | ICD-10-CM | POA: Diagnosis not present

## 2019-02-10 DIAGNOSIS — M1990 Unspecified osteoarthritis, unspecified site: Secondary | ICD-10-CM | POA: Diagnosis not present

## 2019-02-10 DIAGNOSIS — R531 Weakness: Secondary | ICD-10-CM | POA: Diagnosis not present

## 2019-02-11 DIAGNOSIS — R531 Weakness: Secondary | ICD-10-CM | POA: Diagnosis not present

## 2019-02-11 DIAGNOSIS — C3490 Malignant neoplasm of unspecified part of unspecified bronchus or lung: Secondary | ICD-10-CM | POA: Diagnosis not present

## 2019-02-11 DIAGNOSIS — E039 Hypothyroidism, unspecified: Secondary | ICD-10-CM | POA: Diagnosis not present

## 2019-02-11 DIAGNOSIS — M1990 Unspecified osteoarthritis, unspecified site: Secondary | ICD-10-CM | POA: Diagnosis not present

## 2019-02-11 DIAGNOSIS — R112 Nausea with vomiting, unspecified: Secondary | ICD-10-CM | POA: Diagnosis not present

## 2019-02-11 DIAGNOSIS — J449 Chronic obstructive pulmonary disease, unspecified: Secondary | ICD-10-CM | POA: Diagnosis not present

## 2019-02-12 DIAGNOSIS — J449 Chronic obstructive pulmonary disease, unspecified: Secondary | ICD-10-CM | POA: Diagnosis not present

## 2019-02-12 DIAGNOSIS — R531 Weakness: Secondary | ICD-10-CM | POA: Diagnosis not present

## 2019-02-12 DIAGNOSIS — R112 Nausea with vomiting, unspecified: Secondary | ICD-10-CM | POA: Diagnosis not present

## 2019-02-12 DIAGNOSIS — M1990 Unspecified osteoarthritis, unspecified site: Secondary | ICD-10-CM | POA: Diagnosis not present

## 2019-02-12 DIAGNOSIS — C3490 Malignant neoplasm of unspecified part of unspecified bronchus or lung: Secondary | ICD-10-CM | POA: Diagnosis not present

## 2019-02-12 DIAGNOSIS — E039 Hypothyroidism, unspecified: Secondary | ICD-10-CM | POA: Diagnosis not present

## 2019-02-13 DIAGNOSIS — R531 Weakness: Secondary | ICD-10-CM | POA: Diagnosis not present

## 2019-02-13 DIAGNOSIS — R112 Nausea with vomiting, unspecified: Secondary | ICD-10-CM | POA: Diagnosis not present

## 2019-02-13 DIAGNOSIS — J449 Chronic obstructive pulmonary disease, unspecified: Secondary | ICD-10-CM | POA: Diagnosis not present

## 2019-02-13 DIAGNOSIS — E039 Hypothyroidism, unspecified: Secondary | ICD-10-CM | POA: Diagnosis not present

## 2019-02-13 DIAGNOSIS — C3490 Malignant neoplasm of unspecified part of unspecified bronchus or lung: Secondary | ICD-10-CM | POA: Diagnosis not present

## 2019-02-13 DIAGNOSIS — M1990 Unspecified osteoarthritis, unspecified site: Secondary | ICD-10-CM | POA: Diagnosis not present

## 2019-02-14 DIAGNOSIS — R531 Weakness: Secondary | ICD-10-CM | POA: Diagnosis not present

## 2019-02-14 DIAGNOSIS — R112 Nausea with vomiting, unspecified: Secondary | ICD-10-CM | POA: Diagnosis not present

## 2019-02-14 DIAGNOSIS — C3490 Malignant neoplasm of unspecified part of unspecified bronchus or lung: Secondary | ICD-10-CM | POA: Diagnosis not present

## 2019-02-14 DIAGNOSIS — J449 Chronic obstructive pulmonary disease, unspecified: Secondary | ICD-10-CM | POA: Diagnosis not present

## 2019-02-14 DIAGNOSIS — M1990 Unspecified osteoarthritis, unspecified site: Secondary | ICD-10-CM | POA: Diagnosis not present

## 2019-02-14 DIAGNOSIS — E039 Hypothyroidism, unspecified: Secondary | ICD-10-CM | POA: Diagnosis not present

## 2019-02-15 DIAGNOSIS — E039 Hypothyroidism, unspecified: Secondary | ICD-10-CM | POA: Diagnosis not present

## 2019-02-15 DIAGNOSIS — J449 Chronic obstructive pulmonary disease, unspecified: Secondary | ICD-10-CM | POA: Diagnosis not present

## 2019-02-15 DIAGNOSIS — R531 Weakness: Secondary | ICD-10-CM | POA: Diagnosis not present

## 2019-02-15 DIAGNOSIS — R112 Nausea with vomiting, unspecified: Secondary | ICD-10-CM | POA: Diagnosis not present

## 2019-02-15 DIAGNOSIS — M1990 Unspecified osteoarthritis, unspecified site: Secondary | ICD-10-CM | POA: Diagnosis not present

## 2019-02-15 DIAGNOSIS — C3490 Malignant neoplasm of unspecified part of unspecified bronchus or lung: Secondary | ICD-10-CM | POA: Diagnosis not present

## 2019-02-16 DIAGNOSIS — E039 Hypothyroidism, unspecified: Secondary | ICD-10-CM | POA: Diagnosis not present

## 2019-02-16 DIAGNOSIS — J449 Chronic obstructive pulmonary disease, unspecified: Secondary | ICD-10-CM | POA: Diagnosis not present

## 2019-02-16 DIAGNOSIS — R531 Weakness: Secondary | ICD-10-CM | POA: Diagnosis not present

## 2019-02-16 DIAGNOSIS — R112 Nausea with vomiting, unspecified: Secondary | ICD-10-CM | POA: Diagnosis not present

## 2019-02-16 DIAGNOSIS — M1990 Unspecified osteoarthritis, unspecified site: Secondary | ICD-10-CM | POA: Diagnosis not present

## 2019-02-16 DIAGNOSIS — C3490 Malignant neoplasm of unspecified part of unspecified bronchus or lung: Secondary | ICD-10-CM | POA: Diagnosis not present

## 2019-02-17 DIAGNOSIS — M1990 Unspecified osteoarthritis, unspecified site: Secondary | ICD-10-CM | POA: Diagnosis not present

## 2019-02-17 DIAGNOSIS — C3490 Malignant neoplasm of unspecified part of unspecified bronchus or lung: Secondary | ICD-10-CM | POA: Diagnosis not present

## 2019-02-17 DIAGNOSIS — J449 Chronic obstructive pulmonary disease, unspecified: Secondary | ICD-10-CM | POA: Diagnosis not present

## 2019-02-17 DIAGNOSIS — E039 Hypothyroidism, unspecified: Secondary | ICD-10-CM | POA: Diagnosis not present

## 2019-02-17 DIAGNOSIS — R112 Nausea with vomiting, unspecified: Secondary | ICD-10-CM | POA: Diagnosis not present

## 2019-02-17 DIAGNOSIS — R531 Weakness: Secondary | ICD-10-CM | POA: Diagnosis not present

## 2019-02-18 DIAGNOSIS — J449 Chronic obstructive pulmonary disease, unspecified: Secondary | ICD-10-CM | POA: Diagnosis not present

## 2019-02-18 DIAGNOSIS — R112 Nausea with vomiting, unspecified: Secondary | ICD-10-CM | POA: Diagnosis not present

## 2019-02-18 DIAGNOSIS — E039 Hypothyroidism, unspecified: Secondary | ICD-10-CM | POA: Diagnosis not present

## 2019-02-18 DIAGNOSIS — M1990 Unspecified osteoarthritis, unspecified site: Secondary | ICD-10-CM | POA: Diagnosis not present

## 2019-02-18 DIAGNOSIS — C3490 Malignant neoplasm of unspecified part of unspecified bronchus or lung: Secondary | ICD-10-CM | POA: Diagnosis not present

## 2019-02-18 DIAGNOSIS — R531 Weakness: Secondary | ICD-10-CM | POA: Diagnosis not present

## 2019-02-19 DIAGNOSIS — J449 Chronic obstructive pulmonary disease, unspecified: Secondary | ICD-10-CM | POA: Diagnosis not present

## 2019-02-19 DIAGNOSIS — R531 Weakness: Secondary | ICD-10-CM | POA: Diagnosis not present

## 2019-02-19 DIAGNOSIS — R112 Nausea with vomiting, unspecified: Secondary | ICD-10-CM | POA: Diagnosis not present

## 2019-02-19 DIAGNOSIS — C3490 Malignant neoplasm of unspecified part of unspecified bronchus or lung: Secondary | ICD-10-CM | POA: Diagnosis not present

## 2019-02-19 DIAGNOSIS — E039 Hypothyroidism, unspecified: Secondary | ICD-10-CM | POA: Diagnosis not present

## 2019-02-19 DIAGNOSIS — M1990 Unspecified osteoarthritis, unspecified site: Secondary | ICD-10-CM | POA: Diagnosis not present

## 2019-02-20 DIAGNOSIS — M1990 Unspecified osteoarthritis, unspecified site: Secondary | ICD-10-CM | POA: Diagnosis not present

## 2019-02-20 DIAGNOSIS — C3490 Malignant neoplasm of unspecified part of unspecified bronchus or lung: Secondary | ICD-10-CM | POA: Diagnosis not present

## 2019-02-20 DIAGNOSIS — J449 Chronic obstructive pulmonary disease, unspecified: Secondary | ICD-10-CM | POA: Diagnosis not present

## 2019-02-20 DIAGNOSIS — R531 Weakness: Secondary | ICD-10-CM | POA: Diagnosis not present

## 2019-02-20 DIAGNOSIS — R112 Nausea with vomiting, unspecified: Secondary | ICD-10-CM | POA: Diagnosis not present

## 2019-02-20 DIAGNOSIS — E039 Hypothyroidism, unspecified: Secondary | ICD-10-CM | POA: Diagnosis not present

## 2019-02-21 DIAGNOSIS — R531 Weakness: Secondary | ICD-10-CM | POA: Diagnosis not present

## 2019-02-21 DIAGNOSIS — E039 Hypothyroidism, unspecified: Secondary | ICD-10-CM | POA: Diagnosis not present

## 2019-02-21 DIAGNOSIS — C3490 Malignant neoplasm of unspecified part of unspecified bronchus or lung: Secondary | ICD-10-CM | POA: Diagnosis not present

## 2019-02-21 DIAGNOSIS — R112 Nausea with vomiting, unspecified: Secondary | ICD-10-CM | POA: Diagnosis not present

## 2019-02-21 DIAGNOSIS — M1990 Unspecified osteoarthritis, unspecified site: Secondary | ICD-10-CM | POA: Diagnosis not present

## 2019-02-21 DIAGNOSIS — J449 Chronic obstructive pulmonary disease, unspecified: Secondary | ICD-10-CM | POA: Diagnosis not present

## 2019-02-22 DIAGNOSIS — R112 Nausea with vomiting, unspecified: Secondary | ICD-10-CM | POA: Diagnosis not present

## 2019-02-22 DIAGNOSIS — C3490 Malignant neoplasm of unspecified part of unspecified bronchus or lung: Secondary | ICD-10-CM | POA: Diagnosis not present

## 2019-02-22 DIAGNOSIS — J449 Chronic obstructive pulmonary disease, unspecified: Secondary | ICD-10-CM | POA: Diagnosis not present

## 2019-02-22 DIAGNOSIS — R531 Weakness: Secondary | ICD-10-CM | POA: Diagnosis not present

## 2019-02-22 DIAGNOSIS — E039 Hypothyroidism, unspecified: Secondary | ICD-10-CM | POA: Diagnosis not present

## 2019-02-22 DIAGNOSIS — M1990 Unspecified osteoarthritis, unspecified site: Secondary | ICD-10-CM | POA: Diagnosis not present

## 2019-02-23 DIAGNOSIS — R112 Nausea with vomiting, unspecified: Secondary | ICD-10-CM | POA: Diagnosis not present

## 2019-02-23 DIAGNOSIS — R0989 Other specified symptoms and signs involving the circulatory and respiratory systems: Secondary | ICD-10-CM | POA: Diagnosis not present

## 2019-02-23 DIAGNOSIS — R0602 Shortness of breath: Secondary | ICD-10-CM | POA: Diagnosis not present

## 2019-02-23 DIAGNOSIS — R52 Pain, unspecified: Secondary | ICD-10-CM | POA: Diagnosis not present

## 2019-02-23 DIAGNOSIS — E039 Hypothyroidism, unspecified: Secondary | ICD-10-CM | POA: Diagnosis not present

## 2019-02-23 DIAGNOSIS — R531 Weakness: Secondary | ICD-10-CM | POA: Diagnosis not present

## 2019-02-23 DIAGNOSIS — K5900 Constipation, unspecified: Secondary | ICD-10-CM | POA: Diagnosis not present

## 2019-02-23 DIAGNOSIS — R682 Dry mouth, unspecified: Secondary | ICD-10-CM | POA: Diagnosis not present

## 2019-02-23 DIAGNOSIS — J449 Chronic obstructive pulmonary disease, unspecified: Secondary | ICD-10-CM | POA: Diagnosis not present

## 2019-02-23 DIAGNOSIS — R630 Anorexia: Secondary | ICD-10-CM | POA: Diagnosis not present

## 2019-02-23 DIAGNOSIS — F419 Anxiety disorder, unspecified: Secondary | ICD-10-CM | POA: Diagnosis not present

## 2019-02-23 DIAGNOSIS — C3490 Malignant neoplasm of unspecified part of unspecified bronchus or lung: Secondary | ICD-10-CM | POA: Diagnosis not present

## 2019-02-23 DIAGNOSIS — M1990 Unspecified osteoarthritis, unspecified site: Secondary | ICD-10-CM | POA: Diagnosis not present

## 2019-02-24 DIAGNOSIS — E039 Hypothyroidism, unspecified: Secondary | ICD-10-CM | POA: Diagnosis not present

## 2019-02-24 DIAGNOSIS — C3490 Malignant neoplasm of unspecified part of unspecified bronchus or lung: Secondary | ICD-10-CM | POA: Diagnosis not present

## 2019-02-24 DIAGNOSIS — M1990 Unspecified osteoarthritis, unspecified site: Secondary | ICD-10-CM | POA: Diagnosis not present

## 2019-02-24 DIAGNOSIS — J449 Chronic obstructive pulmonary disease, unspecified: Secondary | ICD-10-CM | POA: Diagnosis not present

## 2019-02-24 DIAGNOSIS — R531 Weakness: Secondary | ICD-10-CM | POA: Diagnosis not present

## 2019-02-24 DIAGNOSIS — R112 Nausea with vomiting, unspecified: Secondary | ICD-10-CM | POA: Diagnosis not present

## 2019-02-25 DIAGNOSIS — R531 Weakness: Secondary | ICD-10-CM | POA: Diagnosis not present

## 2019-02-25 DIAGNOSIS — J449 Chronic obstructive pulmonary disease, unspecified: Secondary | ICD-10-CM | POA: Diagnosis not present

## 2019-02-25 DIAGNOSIS — E039 Hypothyroidism, unspecified: Secondary | ICD-10-CM | POA: Diagnosis not present

## 2019-02-25 DIAGNOSIS — R112 Nausea with vomiting, unspecified: Secondary | ICD-10-CM | POA: Diagnosis not present

## 2019-02-25 DIAGNOSIS — M1990 Unspecified osteoarthritis, unspecified site: Secondary | ICD-10-CM | POA: Diagnosis not present

## 2019-02-25 DIAGNOSIS — C3490 Malignant neoplasm of unspecified part of unspecified bronchus or lung: Secondary | ICD-10-CM | POA: Diagnosis not present

## 2019-02-26 DIAGNOSIS — R112 Nausea with vomiting, unspecified: Secondary | ICD-10-CM | POA: Diagnosis not present

## 2019-02-26 DIAGNOSIS — E039 Hypothyroidism, unspecified: Secondary | ICD-10-CM | POA: Diagnosis not present

## 2019-02-26 DIAGNOSIS — C3490 Malignant neoplasm of unspecified part of unspecified bronchus or lung: Secondary | ICD-10-CM | POA: Diagnosis not present

## 2019-02-26 DIAGNOSIS — M1990 Unspecified osteoarthritis, unspecified site: Secondary | ICD-10-CM | POA: Diagnosis not present

## 2019-02-26 DIAGNOSIS — R531 Weakness: Secondary | ICD-10-CM | POA: Diagnosis not present

## 2019-02-26 DIAGNOSIS — J449 Chronic obstructive pulmonary disease, unspecified: Secondary | ICD-10-CM | POA: Diagnosis not present

## 2019-02-27 DIAGNOSIS — C3490 Malignant neoplasm of unspecified part of unspecified bronchus or lung: Secondary | ICD-10-CM | POA: Diagnosis not present

## 2019-02-27 DIAGNOSIS — J449 Chronic obstructive pulmonary disease, unspecified: Secondary | ICD-10-CM | POA: Diagnosis not present

## 2019-02-27 DIAGNOSIS — M1990 Unspecified osteoarthritis, unspecified site: Secondary | ICD-10-CM | POA: Diagnosis not present

## 2019-02-27 DIAGNOSIS — R531 Weakness: Secondary | ICD-10-CM | POA: Diagnosis not present

## 2019-02-27 DIAGNOSIS — E039 Hypothyroidism, unspecified: Secondary | ICD-10-CM | POA: Diagnosis not present

## 2019-02-27 DIAGNOSIS — R112 Nausea with vomiting, unspecified: Secondary | ICD-10-CM | POA: Diagnosis not present

## 2019-02-28 DIAGNOSIS — C3490 Malignant neoplasm of unspecified part of unspecified bronchus or lung: Secondary | ICD-10-CM | POA: Diagnosis not present

## 2019-02-28 DIAGNOSIS — E039 Hypothyroidism, unspecified: Secondary | ICD-10-CM | POA: Diagnosis not present

## 2019-02-28 DIAGNOSIS — R531 Weakness: Secondary | ICD-10-CM | POA: Diagnosis not present

## 2019-02-28 DIAGNOSIS — J449 Chronic obstructive pulmonary disease, unspecified: Secondary | ICD-10-CM | POA: Diagnosis not present

## 2019-02-28 DIAGNOSIS — M1990 Unspecified osteoarthritis, unspecified site: Secondary | ICD-10-CM | POA: Diagnosis not present

## 2019-02-28 DIAGNOSIS — R112 Nausea with vomiting, unspecified: Secondary | ICD-10-CM | POA: Diagnosis not present

## 2019-03-01 DIAGNOSIS — M1990 Unspecified osteoarthritis, unspecified site: Secondary | ICD-10-CM | POA: Diagnosis not present

## 2019-03-01 DIAGNOSIS — J449 Chronic obstructive pulmonary disease, unspecified: Secondary | ICD-10-CM | POA: Diagnosis not present

## 2019-03-01 DIAGNOSIS — R531 Weakness: Secondary | ICD-10-CM | POA: Diagnosis not present

## 2019-03-01 DIAGNOSIS — E039 Hypothyroidism, unspecified: Secondary | ICD-10-CM | POA: Diagnosis not present

## 2019-03-01 DIAGNOSIS — C3490 Malignant neoplasm of unspecified part of unspecified bronchus or lung: Secondary | ICD-10-CM | POA: Diagnosis not present

## 2019-03-01 DIAGNOSIS — R112 Nausea with vomiting, unspecified: Secondary | ICD-10-CM | POA: Diagnosis not present

## 2019-03-02 DIAGNOSIS — M1990 Unspecified osteoarthritis, unspecified site: Secondary | ICD-10-CM | POA: Diagnosis not present

## 2019-03-02 DIAGNOSIS — R531 Weakness: Secondary | ICD-10-CM | POA: Diagnosis not present

## 2019-03-02 DIAGNOSIS — J449 Chronic obstructive pulmonary disease, unspecified: Secondary | ICD-10-CM | POA: Diagnosis not present

## 2019-03-02 DIAGNOSIS — E039 Hypothyroidism, unspecified: Secondary | ICD-10-CM | POA: Diagnosis not present

## 2019-03-02 DIAGNOSIS — R112 Nausea with vomiting, unspecified: Secondary | ICD-10-CM | POA: Diagnosis not present

## 2019-03-02 DIAGNOSIS — C3490 Malignant neoplasm of unspecified part of unspecified bronchus or lung: Secondary | ICD-10-CM | POA: Diagnosis not present

## 2019-03-03 DIAGNOSIS — C3490 Malignant neoplasm of unspecified part of unspecified bronchus or lung: Secondary | ICD-10-CM | POA: Diagnosis not present

## 2019-03-03 DIAGNOSIS — R112 Nausea with vomiting, unspecified: Secondary | ICD-10-CM | POA: Diagnosis not present

## 2019-03-03 DIAGNOSIS — J449 Chronic obstructive pulmonary disease, unspecified: Secondary | ICD-10-CM | POA: Diagnosis not present

## 2019-03-03 DIAGNOSIS — M1990 Unspecified osteoarthritis, unspecified site: Secondary | ICD-10-CM | POA: Diagnosis not present

## 2019-03-03 DIAGNOSIS — R531 Weakness: Secondary | ICD-10-CM | POA: Diagnosis not present

## 2019-03-03 DIAGNOSIS — E039 Hypothyroidism, unspecified: Secondary | ICD-10-CM | POA: Diagnosis not present

## 2019-03-04 DIAGNOSIS — J449 Chronic obstructive pulmonary disease, unspecified: Secondary | ICD-10-CM | POA: Diagnosis not present

## 2019-03-04 DIAGNOSIS — C3490 Malignant neoplasm of unspecified part of unspecified bronchus or lung: Secondary | ICD-10-CM | POA: Diagnosis not present

## 2019-03-04 DIAGNOSIS — R531 Weakness: Secondary | ICD-10-CM | POA: Diagnosis not present

## 2019-03-04 DIAGNOSIS — E039 Hypothyroidism, unspecified: Secondary | ICD-10-CM | POA: Diagnosis not present

## 2019-03-04 DIAGNOSIS — R112 Nausea with vomiting, unspecified: Secondary | ICD-10-CM | POA: Diagnosis not present

## 2019-03-04 DIAGNOSIS — M1990 Unspecified osteoarthritis, unspecified site: Secondary | ICD-10-CM | POA: Diagnosis not present

## 2019-03-05 DIAGNOSIS — C3490 Malignant neoplasm of unspecified part of unspecified bronchus or lung: Secondary | ICD-10-CM | POA: Diagnosis not present

## 2019-03-05 DIAGNOSIS — J449 Chronic obstructive pulmonary disease, unspecified: Secondary | ICD-10-CM | POA: Diagnosis not present

## 2019-03-05 DIAGNOSIS — E039 Hypothyroidism, unspecified: Secondary | ICD-10-CM | POA: Diagnosis not present

## 2019-03-05 DIAGNOSIS — R531 Weakness: Secondary | ICD-10-CM | POA: Diagnosis not present

## 2019-03-05 DIAGNOSIS — M1990 Unspecified osteoarthritis, unspecified site: Secondary | ICD-10-CM | POA: Diagnosis not present

## 2019-03-05 DIAGNOSIS — R112 Nausea with vomiting, unspecified: Secondary | ICD-10-CM | POA: Diagnosis not present

## 2019-03-06 DIAGNOSIS — R531 Weakness: Secondary | ICD-10-CM | POA: Diagnosis not present

## 2019-03-06 DIAGNOSIS — M1990 Unspecified osteoarthritis, unspecified site: Secondary | ICD-10-CM | POA: Diagnosis not present

## 2019-03-06 DIAGNOSIS — E039 Hypothyroidism, unspecified: Secondary | ICD-10-CM | POA: Diagnosis not present

## 2019-03-06 DIAGNOSIS — R112 Nausea with vomiting, unspecified: Secondary | ICD-10-CM | POA: Diagnosis not present

## 2019-03-06 DIAGNOSIS — C3490 Malignant neoplasm of unspecified part of unspecified bronchus or lung: Secondary | ICD-10-CM | POA: Diagnosis not present

## 2019-03-06 DIAGNOSIS — J449 Chronic obstructive pulmonary disease, unspecified: Secondary | ICD-10-CM | POA: Diagnosis not present

## 2019-03-07 DIAGNOSIS — M1990 Unspecified osteoarthritis, unspecified site: Secondary | ICD-10-CM | POA: Diagnosis not present

## 2019-03-07 DIAGNOSIS — E039 Hypothyroidism, unspecified: Secondary | ICD-10-CM | POA: Diagnosis not present

## 2019-03-07 DIAGNOSIS — R112 Nausea with vomiting, unspecified: Secondary | ICD-10-CM | POA: Diagnosis not present

## 2019-03-07 DIAGNOSIS — C3490 Malignant neoplasm of unspecified part of unspecified bronchus or lung: Secondary | ICD-10-CM | POA: Diagnosis not present

## 2019-03-07 DIAGNOSIS — J449 Chronic obstructive pulmonary disease, unspecified: Secondary | ICD-10-CM | POA: Diagnosis not present

## 2019-03-07 DIAGNOSIS — R531 Weakness: Secondary | ICD-10-CM | POA: Diagnosis not present

## 2019-03-08 DIAGNOSIS — M1990 Unspecified osteoarthritis, unspecified site: Secondary | ICD-10-CM | POA: Diagnosis not present

## 2019-03-08 DIAGNOSIS — R112 Nausea with vomiting, unspecified: Secondary | ICD-10-CM | POA: Diagnosis not present

## 2019-03-08 DIAGNOSIS — J449 Chronic obstructive pulmonary disease, unspecified: Secondary | ICD-10-CM | POA: Diagnosis not present

## 2019-03-08 DIAGNOSIS — R531 Weakness: Secondary | ICD-10-CM | POA: Diagnosis not present

## 2019-03-08 DIAGNOSIS — C3490 Malignant neoplasm of unspecified part of unspecified bronchus or lung: Secondary | ICD-10-CM | POA: Diagnosis not present

## 2019-03-08 DIAGNOSIS — E039 Hypothyroidism, unspecified: Secondary | ICD-10-CM | POA: Diagnosis not present

## 2019-03-09 DIAGNOSIS — J449 Chronic obstructive pulmonary disease, unspecified: Secondary | ICD-10-CM | POA: Diagnosis not present

## 2019-03-09 DIAGNOSIS — E039 Hypothyroidism, unspecified: Secondary | ICD-10-CM | POA: Diagnosis not present

## 2019-03-09 DIAGNOSIS — R112 Nausea with vomiting, unspecified: Secondary | ICD-10-CM | POA: Diagnosis not present

## 2019-03-09 DIAGNOSIS — R531 Weakness: Secondary | ICD-10-CM | POA: Diagnosis not present

## 2019-03-09 DIAGNOSIS — M1990 Unspecified osteoarthritis, unspecified site: Secondary | ICD-10-CM | POA: Diagnosis not present

## 2019-03-09 DIAGNOSIS — C3490 Malignant neoplasm of unspecified part of unspecified bronchus or lung: Secondary | ICD-10-CM | POA: Diagnosis not present

## 2019-03-10 DIAGNOSIS — R112 Nausea with vomiting, unspecified: Secondary | ICD-10-CM | POA: Diagnosis not present

## 2019-03-10 DIAGNOSIS — J449 Chronic obstructive pulmonary disease, unspecified: Secondary | ICD-10-CM | POA: Diagnosis not present

## 2019-03-10 DIAGNOSIS — E039 Hypothyroidism, unspecified: Secondary | ICD-10-CM | POA: Diagnosis not present

## 2019-03-10 DIAGNOSIS — C3490 Malignant neoplasm of unspecified part of unspecified bronchus or lung: Secondary | ICD-10-CM | POA: Diagnosis not present

## 2019-03-10 DIAGNOSIS — M1990 Unspecified osteoarthritis, unspecified site: Secondary | ICD-10-CM | POA: Diagnosis not present

## 2019-03-10 DIAGNOSIS — R531 Weakness: Secondary | ICD-10-CM | POA: Diagnosis not present

## 2019-03-11 DIAGNOSIS — C3490 Malignant neoplasm of unspecified part of unspecified bronchus or lung: Secondary | ICD-10-CM | POA: Diagnosis not present

## 2019-03-11 DIAGNOSIS — J449 Chronic obstructive pulmonary disease, unspecified: Secondary | ICD-10-CM | POA: Diagnosis not present

## 2019-03-11 DIAGNOSIS — R112 Nausea with vomiting, unspecified: Secondary | ICD-10-CM | POA: Diagnosis not present

## 2019-03-11 DIAGNOSIS — M1990 Unspecified osteoarthritis, unspecified site: Secondary | ICD-10-CM | POA: Diagnosis not present

## 2019-03-11 DIAGNOSIS — R531 Weakness: Secondary | ICD-10-CM | POA: Diagnosis not present

## 2019-03-11 DIAGNOSIS — E039 Hypothyroidism, unspecified: Secondary | ICD-10-CM | POA: Diagnosis not present

## 2019-03-12 DIAGNOSIS — C3490 Malignant neoplasm of unspecified part of unspecified bronchus or lung: Secondary | ICD-10-CM | POA: Diagnosis not present

## 2019-03-12 DIAGNOSIS — J449 Chronic obstructive pulmonary disease, unspecified: Secondary | ICD-10-CM | POA: Diagnosis not present

## 2019-03-12 DIAGNOSIS — R112 Nausea with vomiting, unspecified: Secondary | ICD-10-CM | POA: Diagnosis not present

## 2019-03-12 DIAGNOSIS — E039 Hypothyroidism, unspecified: Secondary | ICD-10-CM | POA: Diagnosis not present

## 2019-03-12 DIAGNOSIS — R531 Weakness: Secondary | ICD-10-CM | POA: Diagnosis not present

## 2019-03-12 DIAGNOSIS — M1990 Unspecified osteoarthritis, unspecified site: Secondary | ICD-10-CM | POA: Diagnosis not present

## 2019-03-13 DIAGNOSIS — R112 Nausea with vomiting, unspecified: Secondary | ICD-10-CM | POA: Diagnosis not present

## 2019-03-13 DIAGNOSIS — E039 Hypothyroidism, unspecified: Secondary | ICD-10-CM | POA: Diagnosis not present

## 2019-03-13 DIAGNOSIS — C3490 Malignant neoplasm of unspecified part of unspecified bronchus or lung: Secondary | ICD-10-CM | POA: Diagnosis not present

## 2019-03-13 DIAGNOSIS — M1990 Unspecified osteoarthritis, unspecified site: Secondary | ICD-10-CM | POA: Diagnosis not present

## 2019-03-13 DIAGNOSIS — R531 Weakness: Secondary | ICD-10-CM | POA: Diagnosis not present

## 2019-03-13 DIAGNOSIS — J449 Chronic obstructive pulmonary disease, unspecified: Secondary | ICD-10-CM | POA: Diagnosis not present

## 2019-03-14 DIAGNOSIS — R531 Weakness: Secondary | ICD-10-CM | POA: Diagnosis not present

## 2019-03-14 DIAGNOSIS — M1990 Unspecified osteoarthritis, unspecified site: Secondary | ICD-10-CM | POA: Diagnosis not present

## 2019-03-14 DIAGNOSIS — J449 Chronic obstructive pulmonary disease, unspecified: Secondary | ICD-10-CM | POA: Diagnosis not present

## 2019-03-14 DIAGNOSIS — C3490 Malignant neoplasm of unspecified part of unspecified bronchus or lung: Secondary | ICD-10-CM | POA: Diagnosis not present

## 2019-03-14 DIAGNOSIS — E039 Hypothyroidism, unspecified: Secondary | ICD-10-CM | POA: Diagnosis not present

## 2019-03-14 DIAGNOSIS — R112 Nausea with vomiting, unspecified: Secondary | ICD-10-CM | POA: Diagnosis not present

## 2019-03-15 DIAGNOSIS — R112 Nausea with vomiting, unspecified: Secondary | ICD-10-CM | POA: Diagnosis not present

## 2019-03-15 DIAGNOSIS — R531 Weakness: Secondary | ICD-10-CM | POA: Diagnosis not present

## 2019-03-15 DIAGNOSIS — C3490 Malignant neoplasm of unspecified part of unspecified bronchus or lung: Secondary | ICD-10-CM | POA: Diagnosis not present

## 2019-03-15 DIAGNOSIS — M1990 Unspecified osteoarthritis, unspecified site: Secondary | ICD-10-CM | POA: Diagnosis not present

## 2019-03-15 DIAGNOSIS — E039 Hypothyroidism, unspecified: Secondary | ICD-10-CM | POA: Diagnosis not present

## 2019-03-15 DIAGNOSIS — J449 Chronic obstructive pulmonary disease, unspecified: Secondary | ICD-10-CM | POA: Diagnosis not present

## 2019-03-16 DIAGNOSIS — M1990 Unspecified osteoarthritis, unspecified site: Secondary | ICD-10-CM | POA: Diagnosis not present

## 2019-03-16 DIAGNOSIS — R531 Weakness: Secondary | ICD-10-CM | POA: Diagnosis not present

## 2019-03-16 DIAGNOSIS — E039 Hypothyroidism, unspecified: Secondary | ICD-10-CM | POA: Diagnosis not present

## 2019-03-16 DIAGNOSIS — C3490 Malignant neoplasm of unspecified part of unspecified bronchus or lung: Secondary | ICD-10-CM | POA: Diagnosis not present

## 2019-03-16 DIAGNOSIS — R112 Nausea with vomiting, unspecified: Secondary | ICD-10-CM | POA: Diagnosis not present

## 2019-03-16 DIAGNOSIS — J449 Chronic obstructive pulmonary disease, unspecified: Secondary | ICD-10-CM | POA: Diagnosis not present

## 2019-03-17 DIAGNOSIS — J449 Chronic obstructive pulmonary disease, unspecified: Secondary | ICD-10-CM | POA: Diagnosis not present

## 2019-03-17 DIAGNOSIS — R531 Weakness: Secondary | ICD-10-CM | POA: Diagnosis not present

## 2019-03-17 DIAGNOSIS — M1990 Unspecified osteoarthritis, unspecified site: Secondary | ICD-10-CM | POA: Diagnosis not present

## 2019-03-17 DIAGNOSIS — C3490 Malignant neoplasm of unspecified part of unspecified bronchus or lung: Secondary | ICD-10-CM | POA: Diagnosis not present

## 2019-03-17 DIAGNOSIS — R112 Nausea with vomiting, unspecified: Secondary | ICD-10-CM | POA: Diagnosis not present

## 2019-03-17 DIAGNOSIS — E039 Hypothyroidism, unspecified: Secondary | ICD-10-CM | POA: Diagnosis not present

## 2019-03-26 DEATH — deceased

## 2019-05-12 IMAGING — US US BIOPSY LYMPH NODE
1 series · 9 of 9 positions shown · non-contrast
Comparison: none

CLINICAL DATA: Adenocarcinoma of the right upper lobe metastatic to
mediastinal lymph nodes, right submandibular node and brain. Prior
right submandibular lymph no biopsy was adequate to establish a
tissue diagnosis but there was not enough tissue for Foundation One
molecular testing. Repeat tissue sampling has been requested for
additional needed molecular testing.

[Series 1: us biopsy lymph node · 0.06mm/px · 9 of 9 slices shown]
[im 1/9]
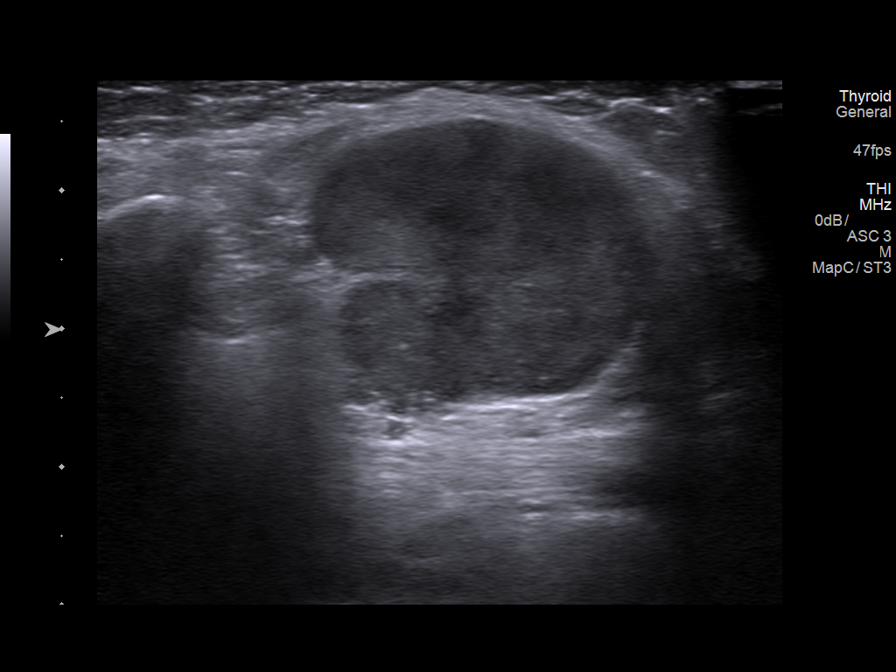
[im 2/9]
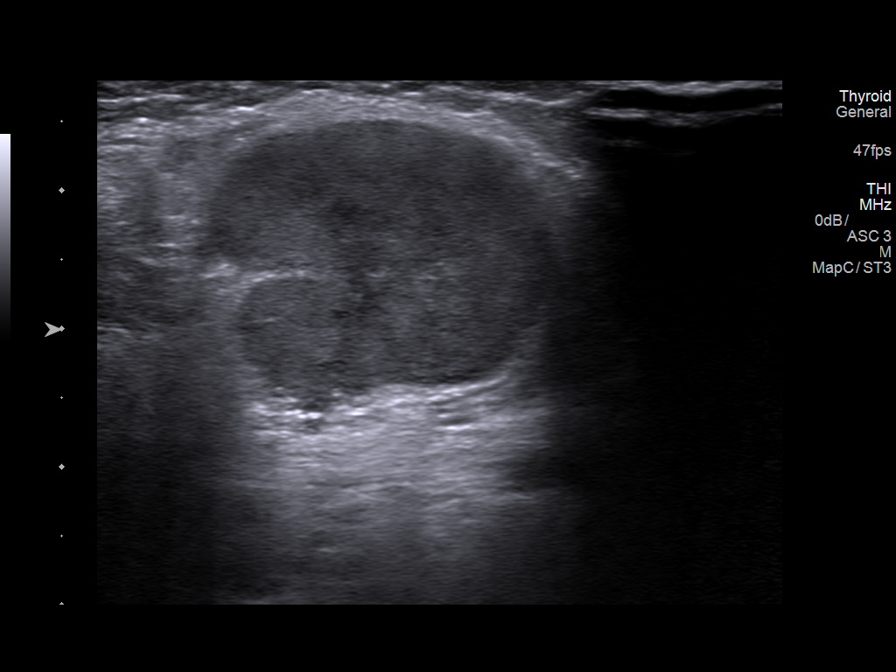
[im 3/9]
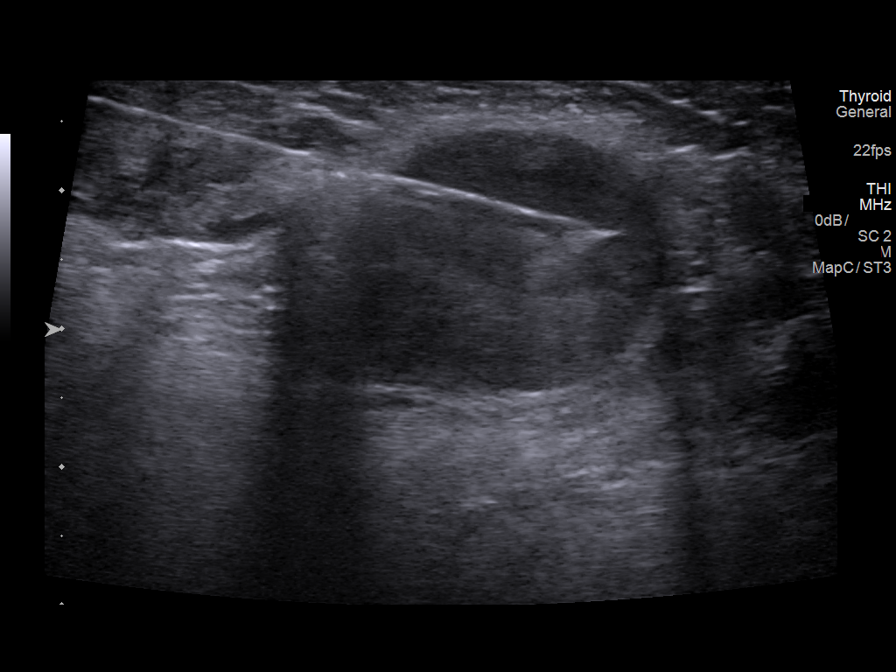
[im 4/9]
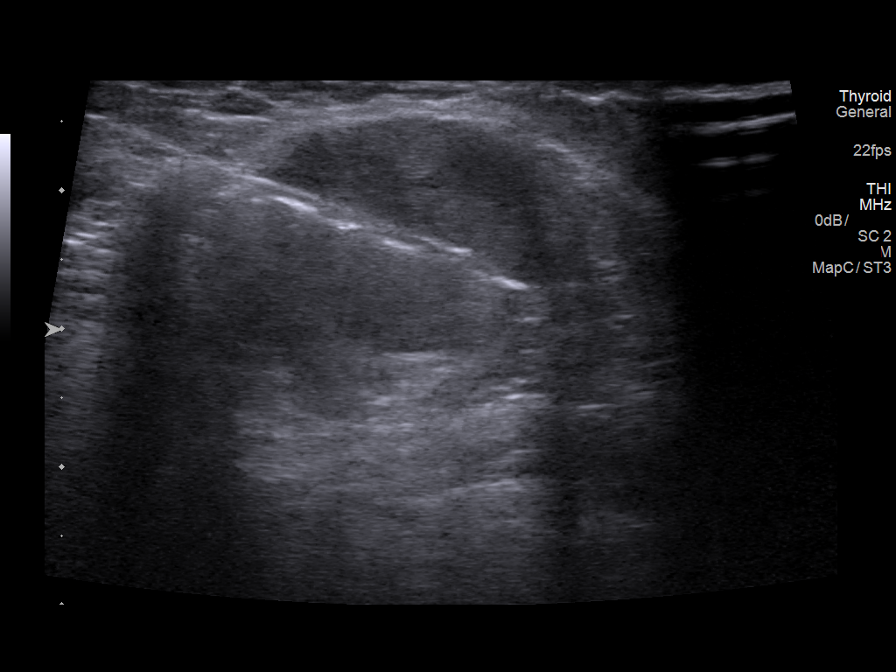
[im 5/9]
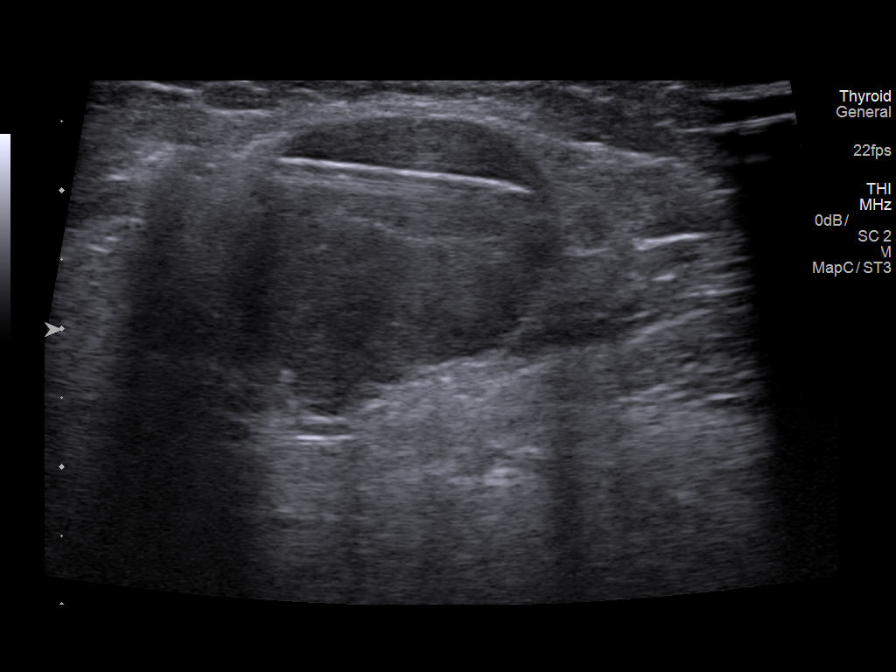
[im 6/9]
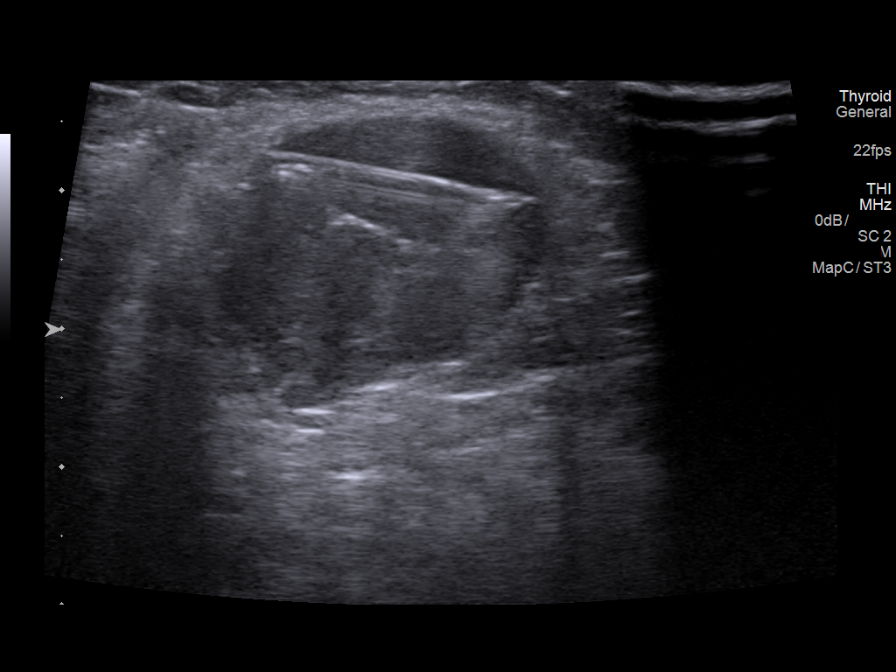
[im 7/9]
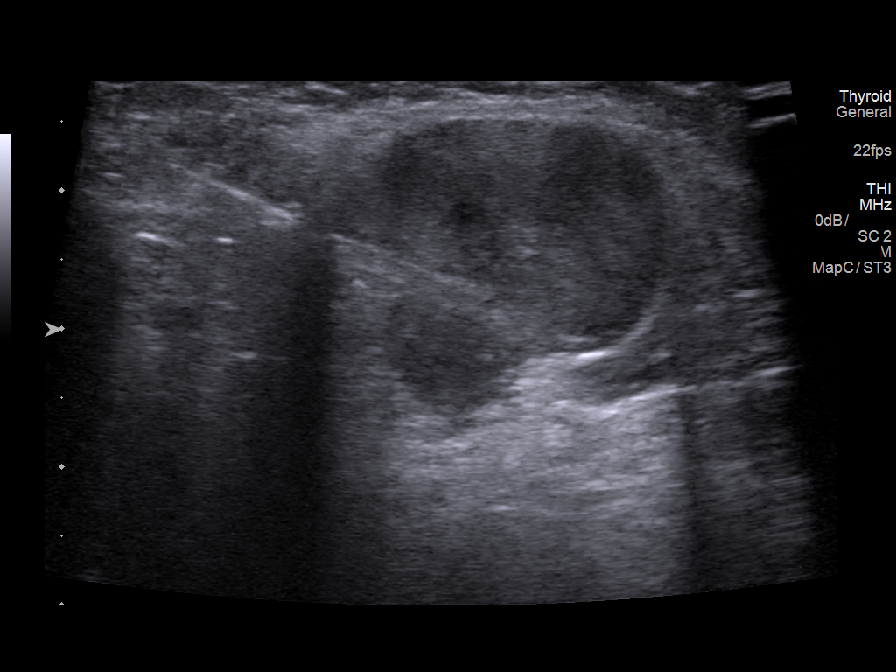
[im 8/9]
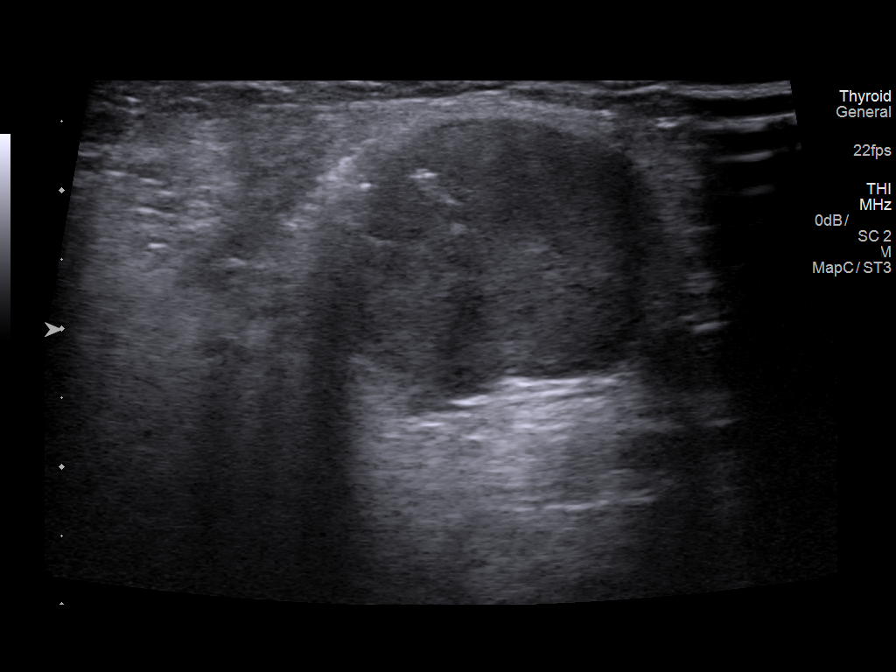
[im 9/9]
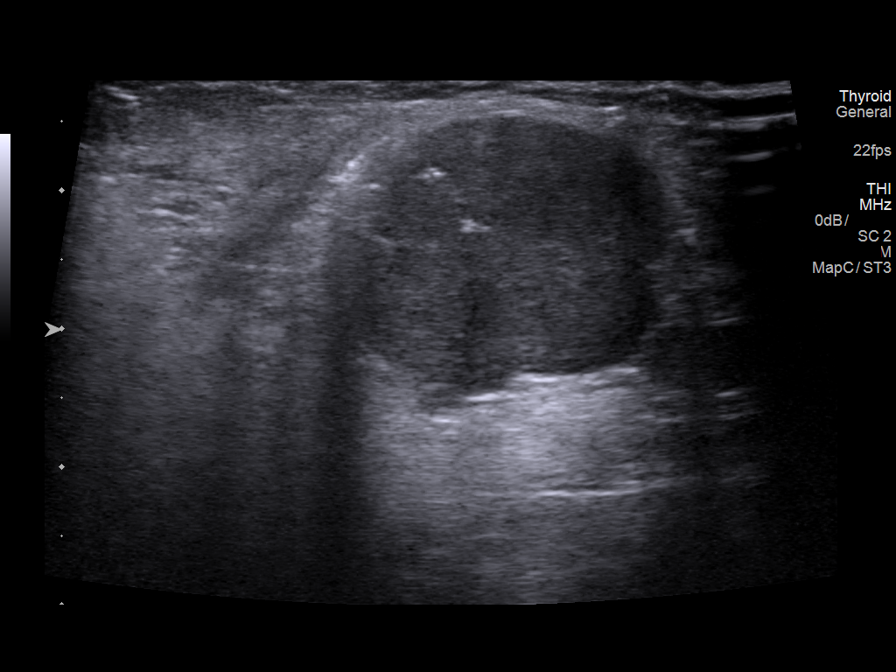

[9 of 9 positions shown; findings below may reference images not displayed]

EXAM:
ULTRASOUND GUIDED CORE BIOPSY OF RIGHT SUBMANDIBULAR LYMPH NODE

MEDICATIONS:
1.5 mg IV Versed; 50 mcg IV Fentanyl

Total Moderate Sedation Time: 13 minutes.

The patient's level of consciousness and physiologic status were
continuously monitored during the procedure by Radiology nursing.

PROCEDURE:
The procedure, risks, benefits, and alternatives were explained to
the patient. Questions regarding the procedure were encouraged and
answered. The patient understands and consents to the procedure. A
time out was performed prior to initiating the procedure.

The right submandibular region was prepped with chlorhexidine in a
sterile fashion, and a sterile drape was applied covering the
operative field. A sterile gown and sterile gloves were used for the
procedure. Local anesthesia was provided with 1% Lidocaine.

A 16 gauge core biopsy device was utilized in obtaining 5 separate
core biopsy samples of an enlarged right submandibular lymph node.
Core biopsy samples were submitted in formalin.

COMPLICATIONS:
None.
FINDINGS: The enlarged right submandibular lymph node was again localized and
measures approximately 2.5 cm in greatest diameter. Solid tissue was
obtained.
IMPRESSION: Ultrasound-guided core biopsy performed of an enlarged and
pathologic right submandibular lymph node. Five separate 16 gauge
core biopsy samples were obtained.

## 2020-04-19 IMAGING — DX DG CHEST 2V
2 series · 2 of 2 positions shown · non-contrast
Comparison: Chest x-ray dated 03/26/2013.

CLINICAL DATA: Constant patient and vomiting, generalized abdominal
pain for 3 days.

EXAM:
CHEST - 2 VIEW

[chest pa]
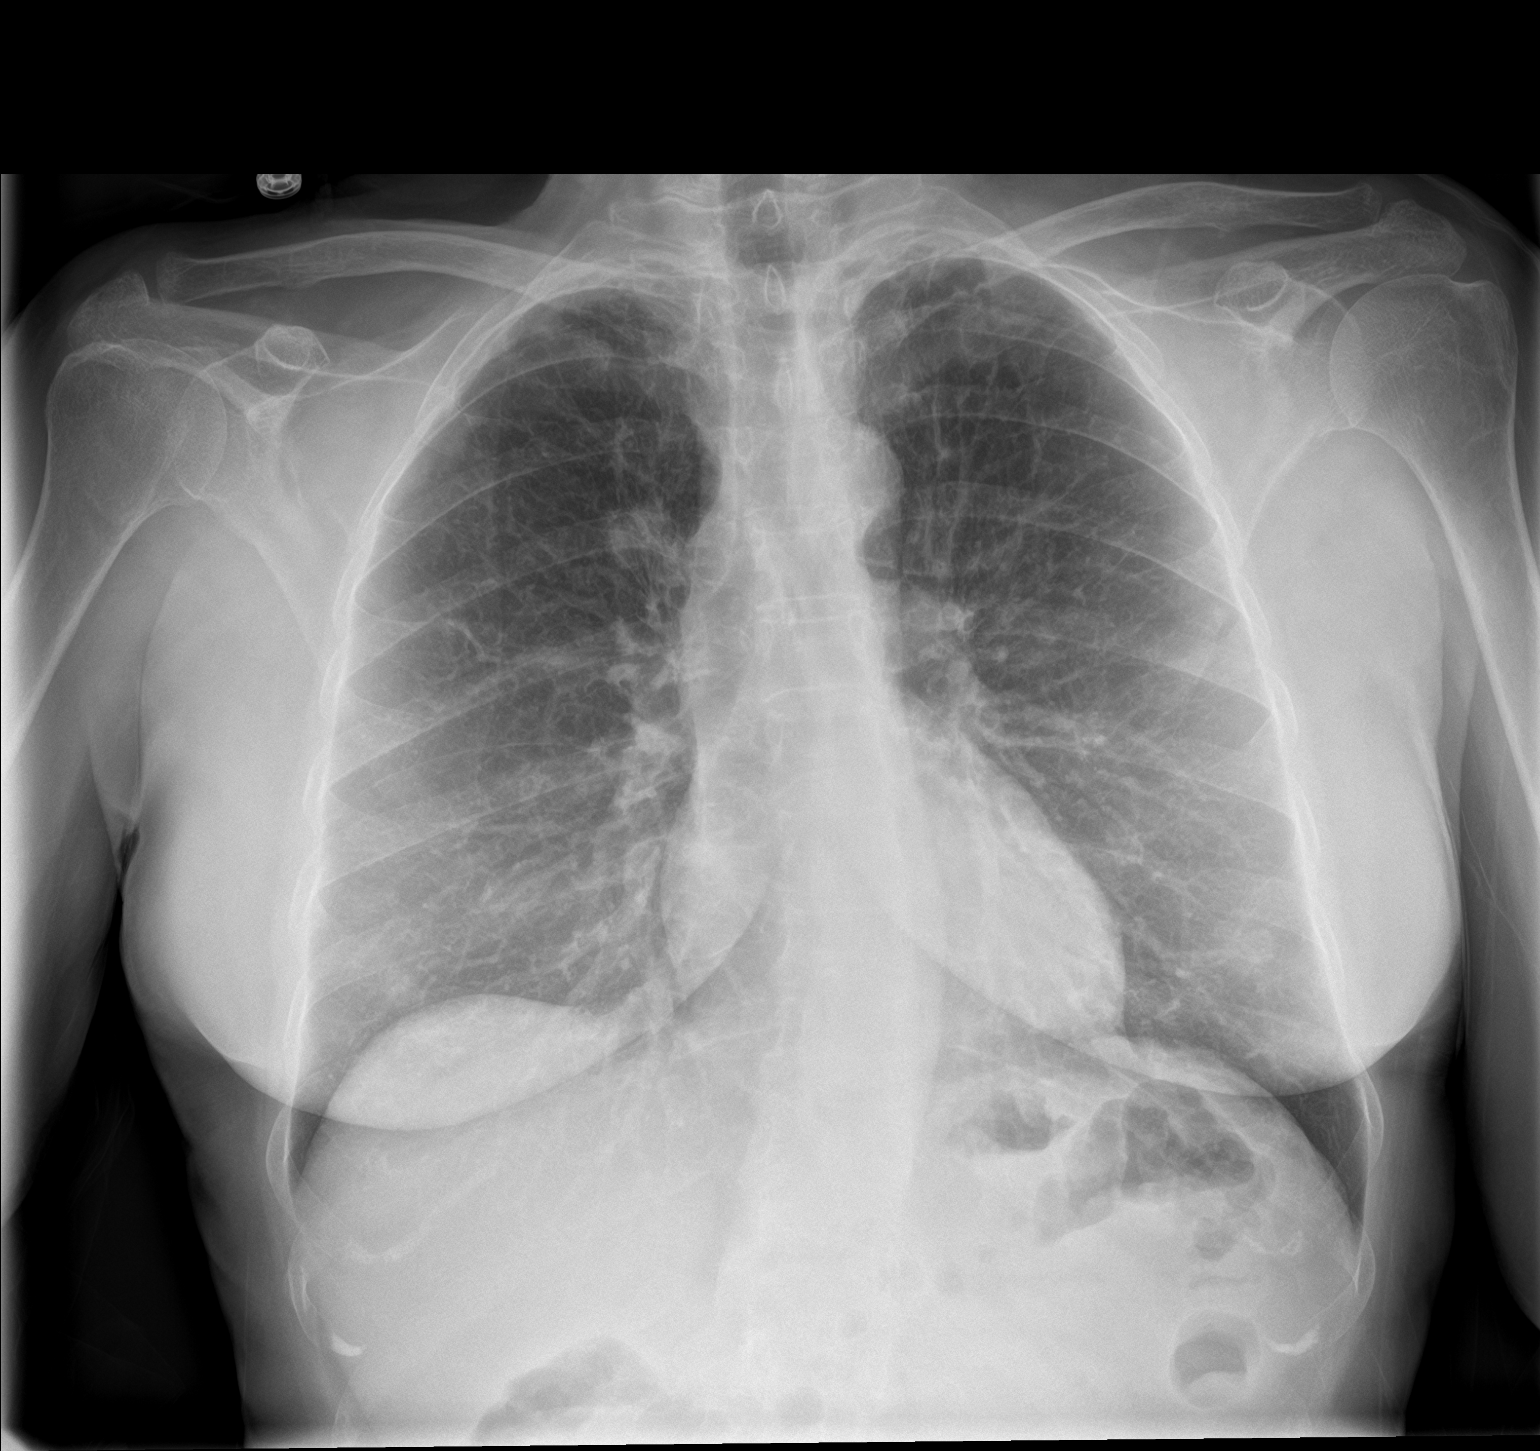

[chest lat]
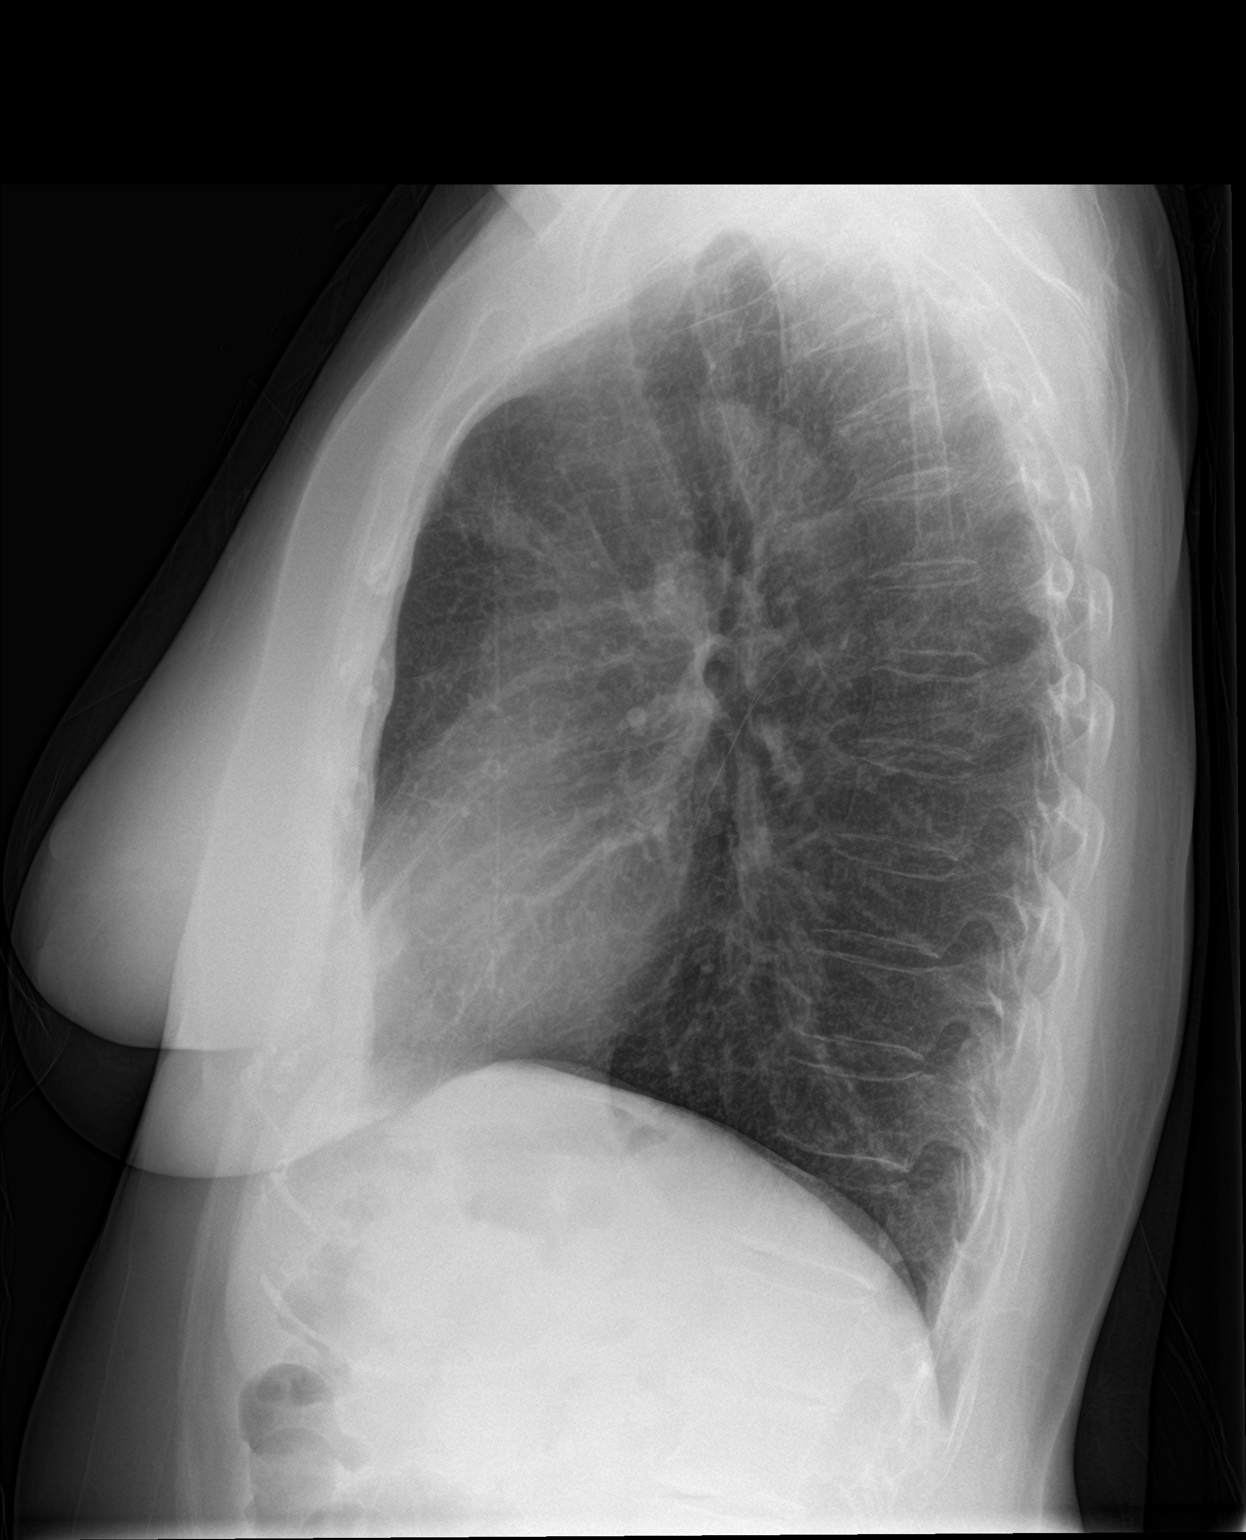

[2 of 2 positions shown; findings below may reference images not displayed]

FINDINGS: Heart size and mediastinal contours are within normal limits. Coarse
lung markings are seen bilaterally suggesting chronic interstitial
lung disease. No confluent opacity to suggest a developing
pneumonia. No pleural effusion or pneumothorax seen.

New ill-defined nodular density is seen within the suprahilar RIGHT
lung, versus a confluence of chronic bronchitic changes.

No acute or suspicious osseous finding.
IMPRESSION: 1. Ill-defined nodular density within the RIGHT suprahilar lung,
medial aspect, with a probable correlate on the lateral view
projected over the anterior clear space. This could represent
neoplastic nodule or a confluence of chronic bronchitic
changes/scarring. Recommend chest CT for further characterization.
2. Probable chronic interstitial lung disease.
3. No evidence of pneumonia or pulmonary edema.

## 2020-09-27 IMAGING — DX DG CHEST 2V
2 series · 2 of 2 positions shown · non-contrast
Comparison: 09/20/2018 chest x-ray, 11/15/18 CT of the chest

CLINICAL DATA: Productive cough for 2 days with fever

EXAM:
CHEST - 2 VIEW

[chest lat]
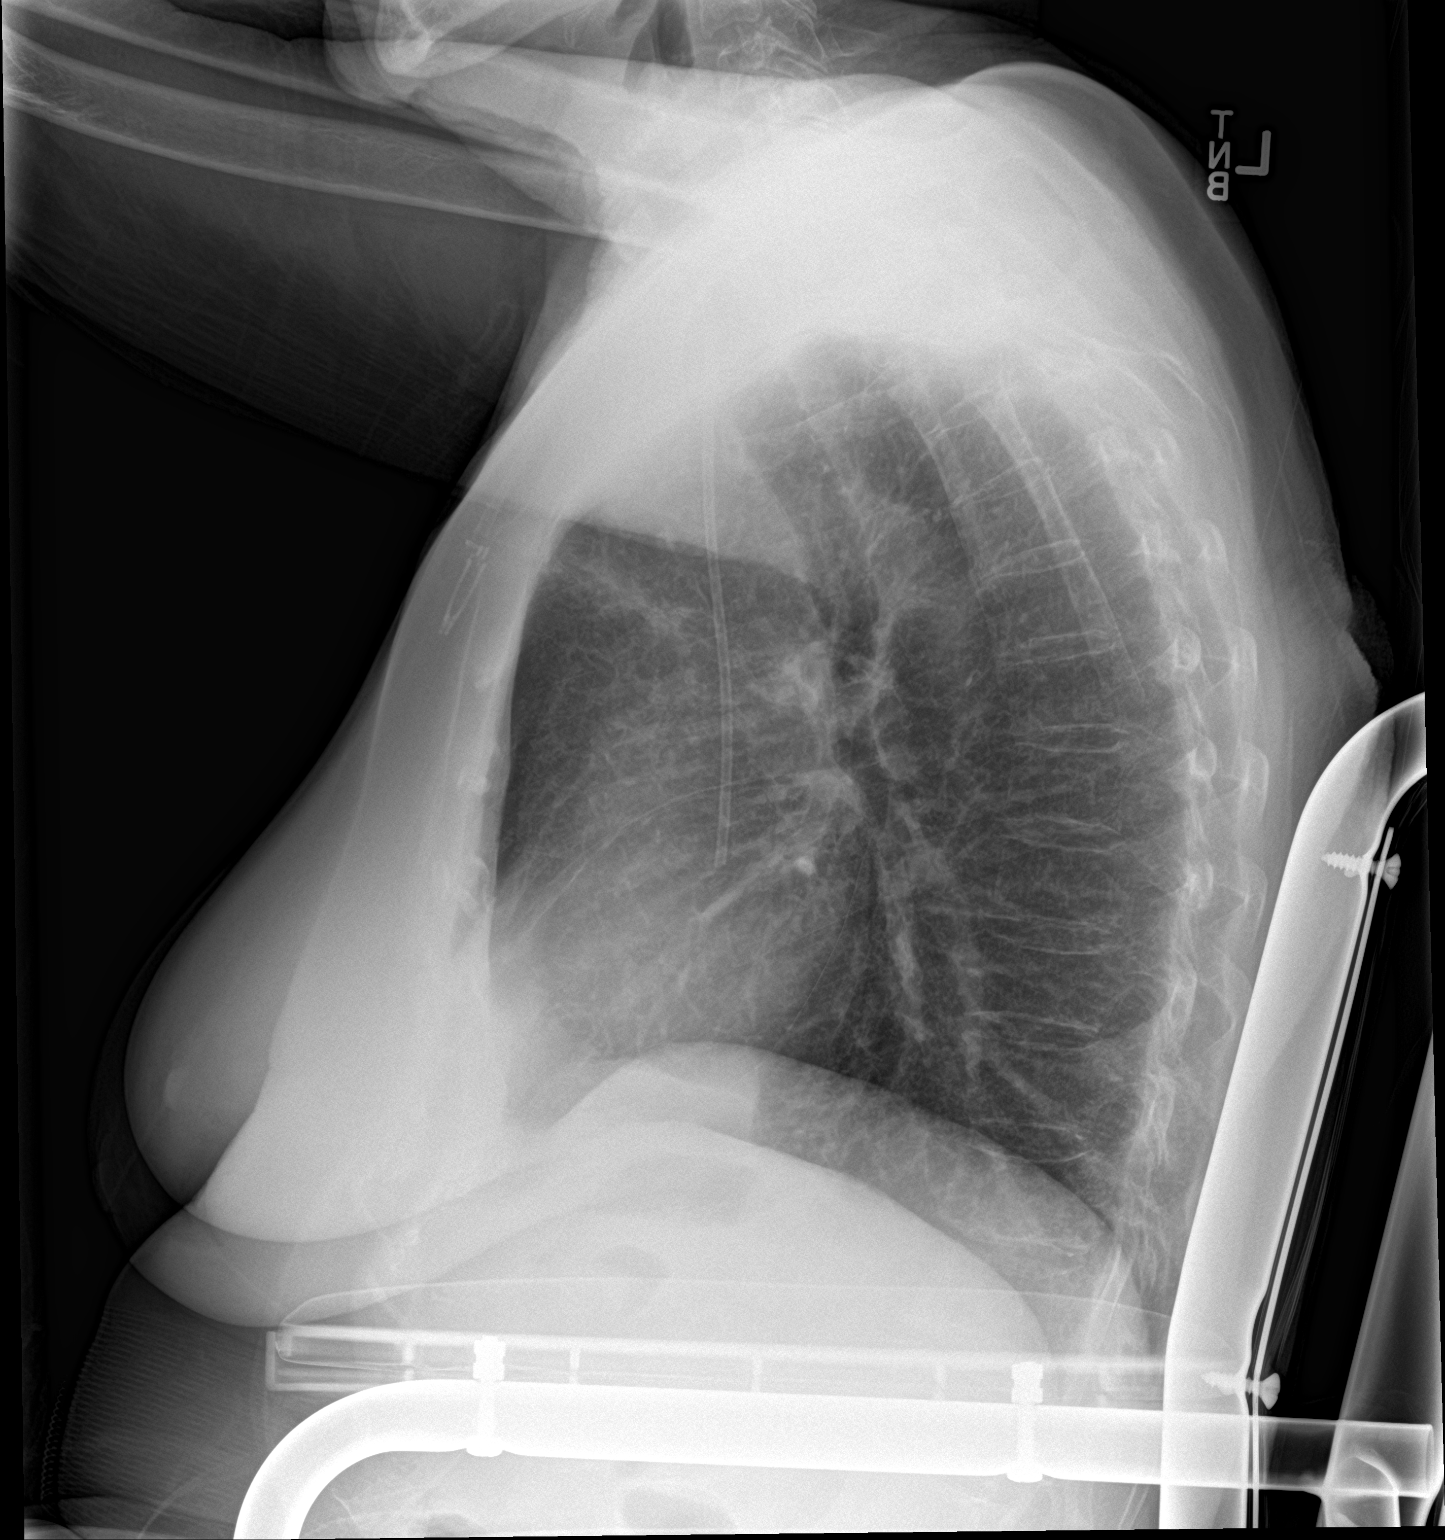

[chest ap]
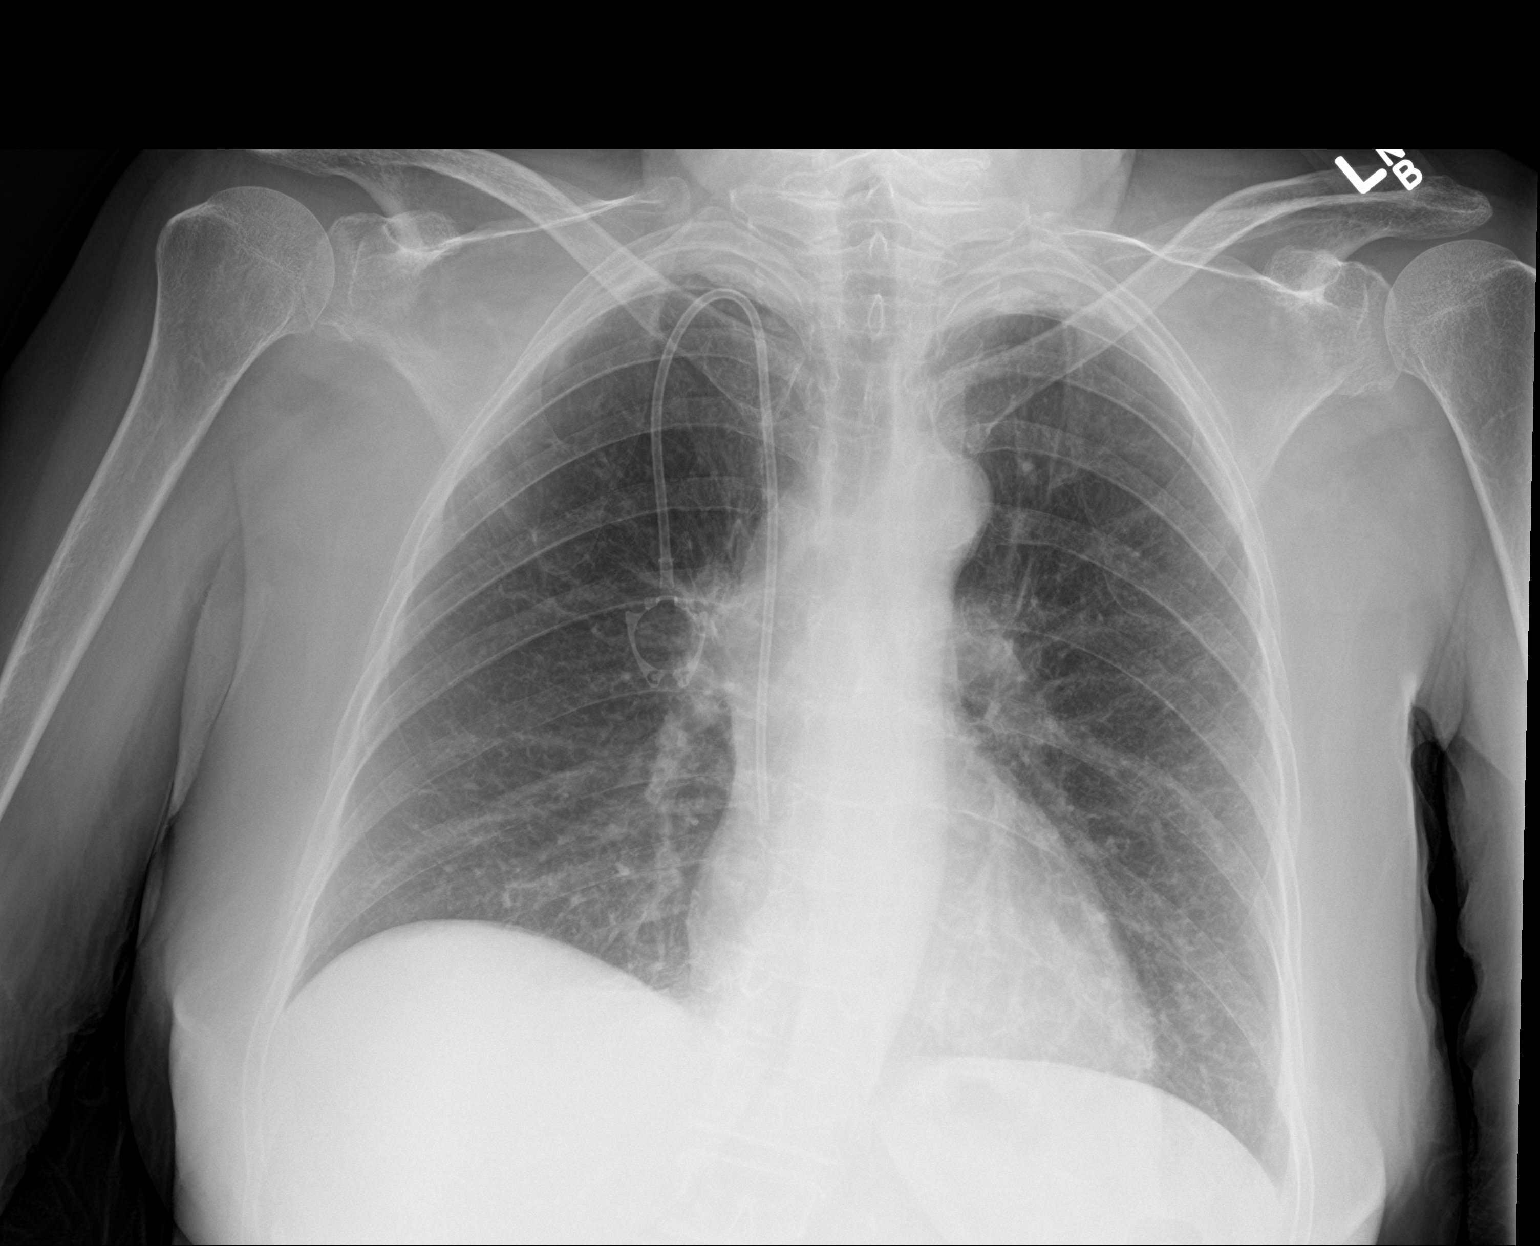

[2 of 2 positions shown; findings below may reference images not displayed]

FINDINGS: Cardiac shadow is within normal limits. The lungs are well aerated
bilaterally. Right chest wall port is noted. Fullness in the right
hilum is noted consistent with the known lymphadenopathy. The
anterior right upper lobe mass lesion is less well appreciated on
today's exam with some mild linear associated density which may
represent some underlying scarring. No sizable effusion is seen. No
bony abnormality is noted.
IMPRESSION: Decrease in prominence of the right upper lobe nodular density when
compared with the prior exam.

Fullness in the right hilar region related to the known adenopathy.

No acute infiltrate is seen.
